# Patient Record
Sex: Female | Born: 1965 | Race: White | Hispanic: No | Marital: Married | Smoking: Current some day smoker
Health system: Southern US, Community
[De-identification: ages and names within clinical notes are randomized; demographics above are authoritative.]

## PROBLEM LIST (undated history)

## (undated) DIAGNOSIS — E119 Type 2 diabetes mellitus without complications: Secondary | ICD-10-CM

## (undated) DIAGNOSIS — E079 Disorder of thyroid, unspecified: Secondary | ICD-10-CM

## (undated) DIAGNOSIS — H539 Unspecified visual disturbance: Secondary | ICD-10-CM

## (undated) DIAGNOSIS — B059 Measles without complication: Secondary | ICD-10-CM

## (undated) DIAGNOSIS — E039 Hypothyroidism, unspecified: Secondary | ICD-10-CM

## (undated) DIAGNOSIS — B269 Mumps without complication: Secondary | ICD-10-CM

## (undated) DIAGNOSIS — E785 Hyperlipidemia, unspecified: Secondary | ICD-10-CM

## (undated) DIAGNOSIS — R262 Difficulty in walking, not elsewhere classified: Secondary | ICD-10-CM

## (undated) DIAGNOSIS — K3184 Gastroparesis: Secondary | ICD-10-CM

## (undated) DIAGNOSIS — M799 Soft tissue disorder, unspecified: Secondary | ICD-10-CM

## (undated) DIAGNOSIS — G8929 Other chronic pain: Secondary | ICD-10-CM

## (undated) DIAGNOSIS — D649 Anemia, unspecified: Secondary | ICD-10-CM

## (undated) DIAGNOSIS — I739 Peripheral vascular disease, unspecified: Secondary | ICD-10-CM

## (undated) DIAGNOSIS — N289 Disorder of kidney and ureter, unspecified: Secondary | ICD-10-CM

## (undated) DIAGNOSIS — G909 Disorder of the autonomic nervous system, unspecified: Secondary | ICD-10-CM

## (undated) DIAGNOSIS — M545 Low back pain, unspecified: Secondary | ICD-10-CM

## (undated) DIAGNOSIS — M549 Dorsalgia, unspecified: Secondary | ICD-10-CM

## (undated) DIAGNOSIS — B019 Varicella without complication: Secondary | ICD-10-CM

## (undated) DIAGNOSIS — K219 Gastro-esophageal reflux disease without esophagitis: Secondary | ICD-10-CM

## (undated) HISTORY — PX: CAROTID ENDARTERECTOMY: SUR193

## (undated) HISTORY — PX: FOOT SURGERY: SHX648

## (undated) HISTORY — PX: FOOT AMPUTATION: SHX951

## (undated) HISTORY — PX: BACK SURGERY: SHX140

## (undated) HISTORY — DX: Dorsalgia, unspecified: M54.9

## (undated) HISTORY — DX: Measles without complication: B05.9

## (undated) HISTORY — PX: SPINE SURGERY: SHX786

## (undated) HISTORY — DX: Varicella without complication: B01.9

## (undated) HISTORY — PX: COLONOSCOPY, DIAGNOSTIC (SCREENING): SHX174

## (undated) HISTORY — DX: Other chronic pain: G89.29

## (undated) HISTORY — DX: Mumps without complication: B26.9

---

## 1994-10-16 ENCOUNTER — Inpatient Hospital Stay
Admission: EM | Admit: 1994-10-16 | Disposition: A | Discharge status: Home / Self Care | Payer: Self-pay | Source: Ambulatory Visit | Admitting: Family Medicine

## 1998-09-19 ENCOUNTER — Ambulatory Visit
Admit: 1998-09-19 | Disposition: A | Discharge status: Home / Self Care | Payer: Self-pay | Source: Ambulatory Visit | Admitting: "Endocrinology

## 1999-08-17 ENCOUNTER — Ambulatory Visit
Admit: 1999-08-17 | Disposition: A | Discharge status: Home / Self Care | Payer: Self-pay | Source: Ambulatory Visit | Admitting: "Endocrinology

## 2015-12-31 DIAGNOSIS — Z22322 Carrier or suspected carrier of Methicillin resistant Staphylococcus aureus: Secondary | ICD-10-CM

## 2015-12-31 HISTORY — DX: Carrier or suspected carrier of methicillin resistant Staphylococcus aureus: Z22.322

## 2016-06-29 HISTORY — PX: OTHER SURGICAL HISTORY: SHX169

## 2016-12-30 DIAGNOSIS — M869 Osteomyelitis, unspecified: Secondary | ICD-10-CM

## 2016-12-30 DIAGNOSIS — Z9289 Personal history of other medical treatment: Secondary | ICD-10-CM

## 2016-12-30 HISTORY — DX: Personal history of other medical treatment: Z92.89

## 2016-12-30 HISTORY — DX: Osteomyelitis, unspecified: M86.9

## 2017-11-21 ENCOUNTER — Encounter (INDEPENDENT_AMBULATORY_CARE_PROVIDER_SITE_OTHER): Payer: Self-pay | Admitting: Neurological Surgery

## 2017-11-25 ENCOUNTER — Ambulatory Visit (INDEPENDENT_AMBULATORY_CARE_PROVIDER_SITE_OTHER): Payer: BC Managed Care – HMO | Admitting: Neurological Surgery

## 2017-11-29 DIAGNOSIS — Z9289 Personal history of other medical treatment: Secondary | ICD-10-CM

## 2017-11-29 HISTORY — DX: Personal history of other medical treatment: Z92.89

## 2017-12-03 ENCOUNTER — Other Ambulatory Visit (INDEPENDENT_AMBULATORY_CARE_PROVIDER_SITE_OTHER): Payer: Self-pay | Admitting: Physician Assistant

## 2017-12-03 LAB — BASIC METABOLIC PANEL
African American eGFR: 67.07
BUN / Creatinine Ratio: 14.2
BUN: 15 mg/dL (ref 6–23)
CO2: 24 mmol/L (ref 19–31)
Calcium: 9.8 mg/dL (ref 8.6–10.2)
Chloride: 102 mEq/l (ref 97–107)
Creatinine: 1.05 mg/dL — ABNORMAL HIGH (ref 0.50–1.00)
Glucose: 186 mg/dL — ABNORMAL HIGH (ref 74–106)
Potassium: 4.7 mmol/L (ref 3.5–5.1)
Sodium: 136 mmol/L (ref 135–145)
non-African American eGFR: 55.43

## 2017-12-03 LAB — HEMOGLOBIN A1C: Hemoglobin A1C: 8.3 g/dL — ABNORMAL HIGH (ref 3.8–5.7)

## 2017-12-04 ENCOUNTER — Ambulatory Visit (INDEPENDENT_AMBULATORY_CARE_PROVIDER_SITE_OTHER): Payer: BC Managed Care – HMO

## 2017-12-04 ENCOUNTER — Other Ambulatory Visit (INDEPENDENT_AMBULATORY_CARE_PROVIDER_SITE_OTHER): Payer: BC Managed Care – HMO | Admitting: Physician Assistant

## 2017-12-04 DIAGNOSIS — Z01811 Encounter for preprocedural respiratory examination: Secondary | ICD-10-CM

## 2017-12-14 DIAGNOSIS — M5440 Lumbago with sciatica, unspecified side: Secondary | ICD-10-CM | POA: Insufficient documentation

## 2017-12-14 DIAGNOSIS — G8929 Other chronic pain: Secondary | ICD-10-CM | POA: Insufficient documentation

## 2017-12-14 DIAGNOSIS — E890 Postprocedural hypothyroidism: Secondary | ICD-10-CM | POA: Insufficient documentation

## 2017-12-14 DIAGNOSIS — E109 Type 1 diabetes mellitus without complications: Secondary | ICD-10-CM | POA: Insufficient documentation

## 2017-12-14 DIAGNOSIS — Z89421 Acquired absence of other right toe(s): Secondary | ICD-10-CM | POA: Insufficient documentation

## 2017-12-15 ENCOUNTER — Other Ambulatory Visit: Payer: Self-pay | Admitting: Cardiovascular Disease

## 2017-12-15 ENCOUNTER — Ambulatory Visit (INDEPENDENT_AMBULATORY_CARE_PROVIDER_SITE_OTHER): Payer: Self-pay | Admitting: Cardiovascular Disease

## 2017-12-15 DIAGNOSIS — Z01810 Encounter for preprocedural cardiovascular examination: Secondary | ICD-10-CM

## 2017-12-15 DIAGNOSIS — E109 Type 1 diabetes mellitus without complications: Secondary | ICD-10-CM

## 2017-12-16 ENCOUNTER — Ambulatory Visit
Admission: RE | Admit: 2017-12-16 | Discharge: 2017-12-16 | Disposition: A | Discharge status: Home / Self Care | Payer: BC Managed Care – HMO | Source: Ambulatory Visit | Attending: Cardiovascular Disease | Admitting: Cardiovascular Disease

## 2017-12-16 ENCOUNTER — Other Ambulatory Visit (INDEPENDENT_AMBULATORY_CARE_PROVIDER_SITE_OTHER): Payer: Self-pay

## 2017-12-16 ENCOUNTER — Other Ambulatory Visit: Payer: Self-pay | Admitting: Cardiovascular Disease

## 2017-12-16 DIAGNOSIS — Z01810 Encounter for preprocedural cardiovascular examination: Secondary | ICD-10-CM

## 2017-12-16 DIAGNOSIS — I348 Other nonrheumatic mitral valve disorders: Secondary | ICD-10-CM | POA: Insufficient documentation

## 2017-12-16 DIAGNOSIS — Z01818 Encounter for other preprocedural examination: Secondary | ICD-10-CM | POA: Insufficient documentation

## 2017-12-17 ENCOUNTER — Inpatient Hospital Stay
Admission: RE | Admit: 2017-12-17 | Discharge: 2017-12-17 | Disposition: A | Discharge status: Home / Self Care | Payer: BC Managed Care – HMO | Source: Ambulatory Visit | Attending: Cardiovascular Disease | Admitting: Cardiovascular Disease

## 2017-12-17 DIAGNOSIS — E782 Mixed hyperlipidemia: Secondary | ICD-10-CM | POA: Insufficient documentation

## 2017-12-17 DIAGNOSIS — E109 Type 1 diabetes mellitus without complications: Secondary | ICD-10-CM | POA: Insufficient documentation

## 2017-12-17 DIAGNOSIS — Z01810 Encounter for preprocedural cardiovascular examination: Secondary | ICD-10-CM

## 2017-12-17 DIAGNOSIS — Z8249 Family history of ischemic heart disease and other diseases of the circulatory system: Secondary | ICD-10-CM | POA: Insufficient documentation

## 2017-12-17 DIAGNOSIS — Z01818 Encounter for other preprocedural examination: Secondary | ICD-10-CM | POA: Insufficient documentation

## 2017-12-17 MED ORDER — TECHNETIUM TC 99M TETROFOSMIN INJECTION
1.00 | Freq: Once | Status: AC | PRN
Start: 2017-12-17 — End: 2017-12-17
  Administered 2017-12-17: 12:00:00 1 via INTRAVENOUS
  Filled 2017-12-17: qty 1

## 2017-12-17 MED ORDER — REGADENOSON 0.4 MG/5ML IV SOLN
0.40 mg | Freq: Once | INTRAVENOUS | Status: AC | PRN
Start: 2017-12-17 — End: 2017-12-17
  Administered 2017-12-17: 12:00:00 0.4 mg via INTRAVENOUS
  Filled 2017-12-17: qty 5

## 2017-12-17 MED ORDER — TECHNETIUM TC 99M TETROFOSMIN INJECTION
1.00 | Freq: Once | Status: AC | PRN
Start: 2017-12-17 — End: 2017-12-17
  Administered 2017-12-17: 11:00:00 1 via INTRAVENOUS
  Filled 2017-12-17: qty 1

## 2017-12-19 DIAGNOSIS — I739 Peripheral vascular disease, unspecified: Secondary | ICD-10-CM | POA: Insufficient documentation

## 2017-12-19 DIAGNOSIS — E782 Mixed hyperlipidemia: Secondary | ICD-10-CM | POA: Insufficient documentation

## 2017-12-22 DIAGNOSIS — M47816 Spondylosis without myelopathy or radiculopathy, lumbar region: Secondary | ICD-10-CM | POA: Insufficient documentation

## 2017-12-30 HISTORY — PX: CAROTID ENDARTERECTOMY: SUR193

## 2018-03-17 ENCOUNTER — Encounter: Payer: Self-pay | Admitting: Acute Care

## 2018-03-17 ENCOUNTER — Ambulatory Visit (INDEPENDENT_AMBULATORY_CARE_PROVIDER_SITE_OTHER): Payer: 59 | Admitting: Acute Care

## 2018-03-17 VITALS — BP 120/73 | HR 73 | Temp 97.6°F | Ht 68.0 in | Wt 163.0 lb

## 2018-03-17 DIAGNOSIS — E1065 Type 1 diabetes mellitus with hyperglycemia: Secondary | ICD-10-CM

## 2018-03-17 MED ORDER — INSULIN ASPART 100 UNIT/ML SC SOLN
SUBCUTANEOUS | 3 refills | Status: DC
Start: 2018-03-17 — End: 2021-02-09

## 2018-03-17 NOTE — Progress Notes (Signed)
[facility] [place] [facility]   Subjective:      [name] [name] is a 52 y.o. female who presents for initial visit of Type 1 diabetes mellitus.  Pt was self referred.  PMH significant for postablative hypothyroidism, back pain with sciatica, osteomyelitis with R+ foot 5th toe/metatarsal amputation, HLD.   I reviewed the notes and labs in EPIC    Diabetes:   Pt was diagnosed with diabetes type 1, at age 31 years old. Has had the classic symptoms of blurred vision, wt loss, polyuria and polydipsia. Was in basal and bolus insulin for years and start using insulin pump 2.5 years ago.       Hg A1c 7.7% done in 12/20/2017. Last Hb A1c is  7.9% done today 03/17/2018.     Current symptoms/problems include hyperglycemia, visual disturbances and bad tasted in her mouth and have been unchanged. Symptoms have been present for several months.  Has medical alert:     Medications:   Current diabetic medications include: Novolog insulin    Insulin pump model: medtronic 670G. Has been on pump since age 89  18.775 units/ 24 hr  Insulin: novolog  Basal setting:                  Carb ratio:               MN to 7:30 AM : 1 :12gm                  7 :30 AM to 11:30 AM, 1:10 gm     11;30 AM to 6:30 PM 1:8 gm   6:30 PM to MN 1:10 gm     Sensitivity: 1:45   Goal:               MN to 6 AM:90 - 110              6AM to MN: 106 - 120                 Changes sensor every 7 days     Insulin rotation areas: jon insulin pump  Glucagon Kit: yes      Diet:  Type of diet: not eat in regular bases, first meal is around 2 - 3 PM,  Good, healthy meals and then are at different times of the day not consistent  How many meals per day: 2  Weight trend: stable  Has you seen a DM educator: in the past, needs insulin pump teaching     Exercise:   Current exercise: walk dailly     Glucose Log:  Current monitoring regimen: blood glucose test x/day  Home blood sugar records: CGM  FBG:300, 100  4 AM 350  12PM 300  Pre lunch: 200's  Pre dinner:  300,180  Post Meal  HS  Any episodes of hypoglycemia? Yes 46, 50's  - in 4 - 5 AM, noticed hypoglycemia is she is in the 60's  Testing urine for Ketones: no     Diabetes complications:   Cerebrovascular disease: no  Retinopathy:   Last eye exam by ophthalmology: Jan 2018  .  Cardiovascular disease: no  BP: at goal  GI: gastroparesis? Reported having some slow digestion  Nephropathy:    On ACE-I/ARB: 65, done 01/21/18  Statin: Lipitor  Peripheral Neuropathy: no  Do you have burning sensation on your feet: no  Do you have tingling sensation on your feet:no  Last foot exam was  Done here. F/u with podiatrist: yes  Peripheral Vascular disease: no, R+ foot osteoporosis, s/p amputation of 5 th toe.      Cardiovascular Risk Factors;  Cardiovascular risk factors: diabetes mellitus, dyslipidemia and smoking/ tobacco exposure  ETOH consumption: 1 -2 glass/month   Do you smoke: regular use of marihuana/medical used? Uses for her eye pain   When do you quit: smoking cigarettes Dec 2018   Do you use smokeless tobacco: no  Illicit drugs: no    Social:   Pt presents today by herself.  Pt is married and live with husband .   Pt not employed.  Support system:    The following portions of the patient's history were reviewed and updated as appropriate: allergies, current medications, past family history, past medical history, past social history, past surgical history and problem list.  Chief Complaint   Patient presents with   . Diabetes     BS146 ;    HBA1C 7.9%     BP 120/73 (BP Site: Right arm, Patient Position: Sitting, Cuff Size: Medium)   Pulse 73   Temp 97.6 F (36.4 C) (Oral)   Ht 1.727 m (5\' 8" )   Wt 73.9 kg (163 lb)   SpO2 98%   BMI 24.78 kg/m   No past medical history on file.  No past surgical history on file.  No family history on file.  No Known Allergies  Social History     Social History   . Marital status: Married     Spouse name: N/A   . Number of children: N/A   . Years of education: N/A     Occupational  History   . Not on file.     Social History Main Topics   . Smoking status: Current Some Day Smoker   . Smokeless tobacco: Not on file   . Alcohol use Not on file   . Drug use: Unknown   . Sexual activity: Not on file     Other Topics Concern   . Not on file     Social History Narrative   . No narrative on file     Current Outpatient Prescriptions on File Prior to Visit   Medication Sig Dispense Refill   . acyclovir (ZOVIRAX) 400 MG tablet Take 400 mg by mouth every 4 (four) hours while awake.     Marland Kitchen atorvastatin (LIPITOR) 10 MG tablet Take 10 mg by mouth daily.     Marland Kitchen b complex vitamins tablet Take 1 tablet by mouth daily.     . insulin lispro protamine-lispro 50-50 MIXTURE (HUMALOG MIX 50-50) (50-50) 100 UNIT/ML Suspension injection Inject into the skin 2 (two) times daily before meals.     Marland Kitchen levothyroxine (SYNTHROID, LEVOTHROID) 137 MCG tablet Take 137 mcg by mouth every morning before breakfast.     . lisinopril (PRINIVIL,ZESTRIL) 10 MG tablet Take 10 mg by mouth daily.     . Omega-3 Fatty Acids (FISH OIL) 1000 MG Cap capsule Take 1 capsule by mouth 2 (two) times daily.     . vitamin E 1000 UNIT capsule Take 1,000 Units by mouth daily.       No current facility-administered medications on file prior to visit.      Vitals:    03/17/18 1343   BP: 120/73   Pulse: 73   Temp: 97.6 F (36.4 C)   SpO2: 98%     No results found for: CHOL, TRIG, HDL, LDL, ALT, AST, NA, K, CL, CREAT,  BUN, CO2, TSH      Review of Systems  Eyes: positive for visual disturbance  Ears, nose, mouth, throat, and face: + for hoarseness, negative nasal congestion, sore mouth and sore throat.  Denies dysphagia.  Respiratory: negative for asthma, + dry cough,  Cardiovascular: negative for chest pain, dyspnea, lower extremity edema, palpitations, syncope and tachypnea  Gastrointestinal: c/o gastroparesis, nocturnal diarrhea (incontinence)   Integument/breast: negative for pruritus, rash, Hematologic/lymphatic: negative for easy bruising,  lymphadenopathy and petechiae  Musculoskeletal:+ back pain, bone pain, muscle weakness, myalgias, bilateral wrist pain  Neurological: negative for coordination problems, dizziness, gait problems, headaches   Behavioral/Psych: anxious, verbose, current marihuana use  Endocrine: see HPI     Objective:      BP 120/73 (BP Site: Right arm, Patient Position: Sitting, Cuff Size: Medium)   Pulse 73   Temp 97.6 F (36.4 C) (Oral)   Ht 1.727 m (5\' 8" )   Wt 73.9 kg (163 lb)   SpO2 98%   BMI 24.78 kg/m   Constitutional: WD, WN, and NAD.   Head: Normocephalic and atraumatic. Mucous with moist membranes   Eyes: Conjunctivae and EOM are normal. PERR  Neck: no adenopathy, no JVD, supple, symmetrical, trachea midline and thyroid not enlarged. No tenderness/mass/nodules  Cardiac: regular rate and rhythm, S1, S2   Pulmonary: clear to auscultation, bilaterally  Abdomen: soft, non-tender; bowel sounds normal  Extremities: No cyanosis, clubbing or edema.  Musculoskeletal: Normal range of motion. Steady gait.  Skin: warm, dry and intact, no hyperpigmentation  Feet: skin intact, decrease toe mobility, decrease light sensation, +pulses, color/temperature normal,   wo hypertrophic nails, dry and hard ulcer at the end of her R+ foot surgical incision.  Pulse +2 symmetric  Neurological: Alert and oriented x 3, speech normal,   Psychiatric: Affect and judgment appropriate.      Hospital Encounter on 01/21/2018  [facility]")   Hospital Encounter on 12/19/2017  [facility]")' href="[email or web address]/">12/20/2017   Hospital Encounter on 12/19/2017  [facility]")' href="[email or web address]/">12/20/2017      Specimen: Blood")'>141   Specimen: Blood")'>134 (L)   Specimen: Blood")'>136 (L)     Specimen: Blood")'>5.2 (H) K   Specimen: Blood")'>5.2 (H)   Specimen: Blood")'>5.3 (H)     Specimen: Blood")'>107 (H)   Specimen:  Blood")'>100   Specimen: Blood")'>100     Specimen: Blood")'>21 (L)   Specimen: Blood")'>26   Specimen: Blood")'>25     Specimen: Blood")'>21 (H)   Specimen: Blood")'>19 (H)   Specimen: Blood")'>21 (H)     Specimen: Blood")'>1.0   Specimen: Blood")'>0.9   Specimen: Blood")'>0.9     Specimen: Blood")'>88   Specimen: Blood")'>228 (H)   Specimen: Blood")'>278 (H)     Specimen: Blood")'>10.0   Specimen: Blood")'>9.4   Specimen: Blood")'>9.5     Specimen: Blood")'>28    Specimen: Blood")'>25     Specimen: Blood")'>23    Specimen: Blood")'>24     Specimen: Blood")'>87    Specimen: Blood")'>54     Specimen: Blood")'>7.3    Specimen: Blood")'>5.9 (L)     Specimen: Blood")'>4.4    Specimen: Blood")'>3.6     Specimen: Blood")'>65   Specimen: Blood")'>74   Specimen: Blood")'>74         Lab Review    POC Hg A1c 7.9%  POC random BG 146     Assessment:   Mr Kretsch is a 52 y/o female, who looks older than her stage age, presented today for initial evaluation, establishing care of T1 DM. Pt was diagnosed  with T1 diabetes 29 years ago. Pt reported was uncontrolled to following consistent CHO diet or consistent with her medications. She has had osteomyelitis on his R+ foot and required 5th toe/metatarsal amputation.     Diabetes Mellitus type I, under fair control, Hg A1c 7.9% above target, on insulin pump, needs more pump teaching and insulin adjustment.     Postablative hypothyroidisms taking Levothyroxine 137 mcg daily    HLD  R+ foot osteomyelitis s/p 5th toe/metatarsal amputation 12/30/2016, has dry ulcer at the end of the surgical incision- needs follow up with podiatrist       Plan:      Visit encounter was 45 minutes, of which 25 minutes were spent on patient's education and discussing the disease process and it's potential implications and impact of patient's health.     Discussed with patient the importance of achieving and maintaining targeted glycemic control in order to reduce the risk of diabetes related  complications such as eye disease, kidney disease, nerve damage, and amputation of the limbs.    Discussed the importance of patient's active involvement in the diabetes care (attending diabetes education classes or individual education sessions, monitoring blood glucose levels before and after meals and keeping a log of self monitored blood glucose, SMBG, taking prescribed medications on time, eating a healthy diabetic diet, and engaging in regular physical activity as tolerated).     Discussed the importance of addressing and managing traditional modifiable cardiac risk factors (lipids, blood pressure, smoking, obesity, sedentary lifestyle) in order to reduce the risk of cardiovascular disease, CVD (heart attacks, heart failure, strokes, and peripheral arterial disease).     Discussed the importance of recognizing, treating, and preventing hypoglycemia.    1.  Rx changes: continue current insulin pump setting. Needs to do food record. Eat in regular bases. Make appointment with the dietitian for MNT and insulin adjustment. Report BG log by Mychart.    2.  Education: Reviewed 'ABCs' of diabetes management (respective goals in parentheses):  A1C (<7), blood pressure (<130/80), and cholesterol (LDL <100).  3.  Compliance at present is estimated to be fair. Efforts to improve consistent carbohydrate diet, regular exercise and SMBG.  4.  Referred for diabetes education, pt has no previous diabetic education, major deficit in diabetes, complications, management, prevention, diet compliance.Marland Kitchen    5.Follow up with ophthalmologist  6. Make appointment with podiatrist    7. Follow up: 6 weeks        Judithann Sheen, NP  [facility]  [address]  [place], [place] [address]  Tel: [phone]  Fax: [phone]

## 2018-03-17 NOTE — Patient Instructions (Signed)
Make appointment with dietitian for insulin adjustment    Keep food log and bring it to your next appointment    Please use Mychart for ease communication     Follow up in 6 weeks

## 2018-03-18 LAB — POCT HEMOGLOBIN A1C: POCT Hgb A1C: 7.9 % — AB (ref 3.9–5.9)

## 2018-03-18 LAB — POCT GLUCOSE: Whole Blood Glucose POCT: 146 mg/dL — AB (ref 70–100)

## 2018-03-18 NOTE — Progress Notes (Signed)
Faxed to the number [phone] ([facility] )the last  progress note requested per the pt.

## 2018-03-23 NOTE — Progress Notes (Signed)
Faxed per second time last progress note and lab result of the pt to the number [phone] to doctor Shanda Howells.

## 2018-03-26 DIAGNOSIS — E559 Vitamin D deficiency, unspecified: Secondary | ICD-10-CM | POA: Insufficient documentation

## 2018-03-26 DIAGNOSIS — Z8639 Personal history of other endocrine, nutritional and metabolic disease: Secondary | ICD-10-CM | POA: Insufficient documentation

## 2018-04-07 ENCOUNTER — Ambulatory Visit (INDEPENDENT_AMBULATORY_CARE_PROVIDER_SITE_OTHER): Payer: Self-pay | Admitting: Cardiovascular Disease

## 2018-04-29 ENCOUNTER — Ambulatory Visit (INDEPENDENT_AMBULATORY_CARE_PROVIDER_SITE_OTHER): Payer: Commercial Managed Care - POS

## 2018-04-29 VITALS — Ht 68.0 in | Wt 167.2 lb

## 2018-04-29 DIAGNOSIS — E1065 Type 1 diabetes mellitus with hyperglycemia: Secondary | ICD-10-CM

## 2018-04-29 NOTE — Progress Notes (Signed)
[facility]  MEDICAL NUTRITION THERAPY ASSESSMENT:    Referral: [name] [name] is 51 y.o.female was referred by Clovis Cao, NP, for Medical Nutrition Therapy for uncontrolled type 1 diabetes.     Client history: Patient's medical history includes HTN, HLD, anemia, and low vitamin D. Complications of diabetes include neuropathy, retinopathy, and gastroparesis; she has had toe amputation in the past. He/she was first diagnosed with diabetes in 1989. S/he has had previous education for diabetes self management; unknown how long ago. She is using a Medtronic 670G insulin pump with Guardian 3 sensor. No reports available today from [facility]. She needs follow up for pump settings adjustment. She reports additional digestive issues including diarrhea, which she thinks is related to stress. She is not sure whether or not she has diverticulosis; may have had an attack of diverticulitis in the past. She is not testing blood glucose adequately; only 1-2 times daily.    Current pump settings:  Basal:   12A 0.750   6:30A 0.8   10P 0.750  Carb ratio:   12A 12   7:30A 10   11:30A 8   6:30P 10  Sensitivity:  45  Targets:   12A 90-110   6P  106-120  IAD: 3.5 hours  Max basal: 2.0 u/hr  Max bolus: 10  Low insulin alert: 20 units    Pertinent Labs:    A1c 7.9% March 2019    SMBG Readings Past Two Weeks: no records provided today.    Anthropometrics:  Estimated body mass index is 25.42 kg/m as calculated from the following:    Height as of this encounter: 1.727 m (5\' 8" ).    Weight as of this encounter: 75.8 kg (167 lb 3.2 oz).    Food allergies: cranberries    Dietary recall/Food log info: No records provided. Will assess at next visit. Her usual awake period is from 10A each morning to 2A the next morning. She reports limited food tolerance with gastroparesis. She tends to "graze" a lot instead of eat meals. She states that she gets 2.5 - 4 bottles water daily (16 oz each).    She  reports that she can eat the following foods without severe symptoms:   Muffins    Sandwich, 1/2   Crackers or bread with cheese   A little fruit at a time   Cheerios    Some cooked vegetables    Nutrition Diagnosis:   Behavioral Environmental Limited adherence to nutrition-related recommendations related to digestive issues as evidenced by patient report.    Intervention:                           Education/training:   Discussed managing blood sugar with insulin when gastroparesis is severe:   Frequent small meals/snacks   May need to give insulin after starting to eat   Provided My Food Plan and reviewed carb counting (what counts, how to count with labels, lists, apps and internet resources), and recommendations for daily carbs and at meals/snacks.   Reminded her to give insulin for all snacks that are carbohydrates; DON'T correct blood sugar without a buffer of at least 2 hours between correction boluses.   Asked her to test blood glucose 3 times daily, to start:   On waking   Before her first meal (more than 1 hour after waking)   2 hours after the start of her "dinner" or largest meals of  the day   Advised her to try to get up to 8 cups fluid every day.   Provided blank records to complete before the next visit    Education Plan:    Advanced features   Need her to register on [facility] and be able to upload or share data   Assess needs    Goals:    A1c <7% without excessive hypoglycemia    Barriers:    Burnout     Understanding and Motivation to follow recommendations: fair    Monitoring/Evaluation:     Follow up: 06/08/18    Progress: she verbalized understanding education and instructions provided today.

## 2018-05-08 ENCOUNTER — Ambulatory Visit (INDEPENDENT_AMBULATORY_CARE_PROVIDER_SITE_OTHER): Payer: Commercial Managed Care - POS | Admitting: Specialist

## 2018-05-08 ENCOUNTER — Encounter (INDEPENDENT_AMBULATORY_CARE_PROVIDER_SITE_OTHER): Payer: Self-pay | Admitting: Specialist

## 2018-05-08 VITALS — BP 125/64 | HR 80 | Ht 68.0 in | Wt 165.0 lb

## 2018-05-08 DIAGNOSIS — E782 Mixed hyperlipidemia: Secondary | ICD-10-CM | POA: Insufficient documentation

## 2018-05-08 DIAGNOSIS — R0989 Other specified symptoms and signs involving the circulatory and respiratory systems: Secondary | ICD-10-CM

## 2018-05-08 DIAGNOSIS — I739 Peripheral vascular disease, unspecified: Secondary | ICD-10-CM

## 2018-05-08 NOTE — Progress Notes (Signed)
[facility]    Chief Complaint   Patient presents with   . Consult (Initial)     consult to possible bilateral LE PAD     History of Present Illness     [name] [name] is a 52 y.o. female who presents as new a patient for evaluation of the lower extremities. The patient has a history of type one diabetes for over 30 years and is managed with an insulin pump. She has a history of a non-healing wound on the right foot, resulting in a partial foot amputation and several reconstructive surgical interventions a few years ago. At that time, she was managed by a podiatrist who felt that she did not have any major arterial disease that needed to be addressed. Since then, she has undergone spinal surgery for lower extremity pain. That pain did not improve with surgery, so she is here for an arterial evaluation.     She described claudication after walking 50 yards in both lower extremities. She denies rest pain or ischemic tissue loss. She does have mild neuropathy of the bilateral feet. She complains of a generalized fatigue of all muscle including the upper extremities, but denies classic claudication symptoms. Finally, she describes feeling light headed and dizzy. She denies symptoms of syncope, but has found herself lying on the ground unexpectedly.     She is currently taking Atorvastatin and Lisinopril. She is a former smoker.     Past Medical History     Past Medical History:   Diagnosis Date   . Chronic back pain    . Osteomyelitis of foot     R+ foot s/p 5th toe and lateral bone surgery       Past Surgical History:   Procedure Laterality Date   . R+ foot 5th toe/metatarsal amputation  12/30/2016       Allergies     No Known Allergies    Medications       Current Outpatient Prescriptions:   .  acyclovir (ZOVIRAX) 400 MG tablet, Take 400 mg by mouth every 4 (four) hours while awake., Disp: , Rfl:   .  atorvastatin (LIPITOR) 10 MG tablet, Take 10 mg by mouth daily., Disp: , Rfl:   .  atorvastatin (LIPITOR)  20 MG tablet, atorvastatin 20 mg tablet  TK 1 T PO D, Disp: , Rfl:   .  b complex vitamins tablet, Take 1 tablet by mouth daily., Disp: , Rfl:   .  insulin aspart (NOVOLOG) 100 UNIT/ML injection, On insulin pump 18 - 25 units/day, Disp: 30 mL, Rfl: 3  .  insulin lispro protamine-lispro 50-50 MIXTURE (HUMALOG MIX 50-50) (50-50) 100 UNIT/ML Suspension injection, Inject into the skin 2 (two) times daily before meals., Disp: , Rfl:   .  levothyroxine (SYNTHROID, LEVOTHROID) 137 MCG tablet, Take 137 mcg by mouth every morning before breakfast., Disp: , Rfl:   .  lisinopril (PRINIVIL,ZESTRIL) 10 MG tablet, Take 10 mg by mouth daily., Disp: , Rfl:   .  Omega-3 Fatty Acids (FISH OIL) 1000 MG Cap capsule, Take 1 capsule by mouth 2 (two) times daily., Disp: , Rfl:   .  vitamin E 1000 UNIT capsule, Take 1,000 Units by mouth daily., Disp: , Rfl:     Review of Systems     Constitutional: Negative for fevers and chills  Skin: No rash or lesions  Respiratory: Negative for cough, wheezing, or hemoptysis  Cardiovascular: Negative for chest pain and dyspnea  Gastrointestinal: Negative for abdominal pain,  nausea, vomiting and diarrhea  Musculoskeletal: See HPI, spinal issues, arthritis.  Genitourinary: Negative for dysuria  All other systems were reviewed and are negative except what is stated in the HPI    Physical Exam     Vitals:    05/08/18 1541   BP: 125/64   Pulse: 80       Body mass index is 25.09 kg/m.    General: Patient appears their stated age, well-nourished. Alert and in no apparent distress.  Lungs: Respiratory effort unlabored, chest expansion symmetric.  Cardiac: RRR, right carotid bruits, no JVD.   Abd: Soft, nondistended, nontender. No guarding or rebound, No mid line pulsatile mass   Extremities:Full ROM, symmetric, warm  Ischemic changes not present, Ulceration not present,  Gangrene not present, Peripheral edema: No  Pulses: Right: Femoral present Popliteal present, DP present, PT only on doppler  Pulses:  Left: Femoral present, Popliteal present, DP present, PT only on doppler  Veins: Varicose veins No, hyperpigmentation No, lipo-dermatosclerosis No  Skin: clean dry and intact  Neuro: Good insight and judgment, oriented to person, place, and time CN II-XII intact, gross motor and sensory intact    Labs     CBC: No results found for: WBC, RBC, HGB, HCT, MCV, MCHC, RDW, PLT    CMP: No results found for: NA, K, CL, CO2, GLU, BUN, CREATININE, PROT, BILITOT, ALKPHOS, AST, ALT, ANIONGAP    Lipid Panel No results found for: CHOL, TRIG, HDL    Coags: No results found for: PT, INR, PTT    Imaging     No new imaging was performed today.     Assessment and Plan       1. Peripheral vascular disease  US Arterial/ Graft Duplex Dopp Low Extrem Bil Comp    [place] Noninvas Low Extrem Art Dopp/Press/Wavefrms (Abi-Ppg) Ltd 1-2 Lvls   2. Bruit of right carotid artery  US Carotid Duplex Dopp Comp Bilateral     Based on the information above, this patient has complaints of claudication of the lower extremities. Her pulse examination is fairly normal; however, her history indicates potentially some arterial disease. Given her history of right foot partial amputation, type I diabetes and being a former smoker, does put her at risk. We have requested she return to the office in the near future for a full arterial evaluation, we will perform an arterial duplex and ankle brachial index. Unfortunately, if the studies are normal and similar to the physical examination, her symptoms are most likely still coming from the back.     Finally, at that appointment we will perform a carotid duplex to evaluate the bilateral carotid arteries. She does have a right carotid bruit.     I hope this plan of care meets with your approval. Thank you for the opportunity to care for your patient.     Sincerely,     Dipankar Marcy Panning, M.D.     [facility]  [address]  [place], [place] [address]  T [phone]  F [phone]

## 2018-05-13 ENCOUNTER — Ambulatory Visit (INDEPENDENT_AMBULATORY_CARE_PROVIDER_SITE_OTHER): Payer: Commercial Managed Care - POS

## 2018-05-13 ENCOUNTER — Ambulatory Visit (INDEPENDENT_AMBULATORY_CARE_PROVIDER_SITE_OTHER): Payer: Commercial Managed Care - POS | Admitting: Specialist

## 2018-05-13 ENCOUNTER — Encounter (INDEPENDENT_AMBULATORY_CARE_PROVIDER_SITE_OTHER): Payer: Self-pay | Admitting: Specialist

## 2018-05-13 VITALS — BP 126/76 | HR 76 | Ht 68.0 in | Wt 165.0 lb

## 2018-05-13 DIAGNOSIS — I6529 Occlusion and stenosis of unspecified carotid artery: Secondary | ICD-10-CM | POA: Insufficient documentation

## 2018-05-13 DIAGNOSIS — R0989 Other specified symptoms and signs involving the circulatory and respiratory systems: Secondary | ICD-10-CM

## 2018-05-13 DIAGNOSIS — I6521 Occlusion and stenosis of right carotid artery: Secondary | ICD-10-CM

## 2018-05-13 DIAGNOSIS — I739 Peripheral vascular disease, unspecified: Secondary | ICD-10-CM

## 2018-05-13 NOTE — Progress Notes (Signed)
[facility]    Chief Complaint   Patient presents with   . Follow-up     1 week f/u to PVD and right  carotid bruit      History of Present Illness     This is a 52 year old female who returns the office for 1 week follow-up appointment to review bilateral lower extremity arterial duplex, ankle-brachial index and carotid duplex.  The patient had originally presented with complaints of bilateral lower extremity pain.  She does have a history of type 1 diabetes for over 30 years and is managed with an insulin pump.  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  That has remained healed for several years.  The patient also has a history of significant spinal stenosis and spinal surgery.  She feels that the pain was not improved with surgical intervention.  Patient is wondering if there is an arterial component to her discomfort.  She denies classic symptoms of claudication, she denies ischemic rest pain and tissue loss.    She does have a history of lightheadedness and dizziness.  She denies symptoms of syncopal episodes. Upon physical exam, at her last appointment, she had a bruit in her right internal carotid artery.  She denies neurological deficit.  She denies a history of stroke or transient ischemic attack.    Past Medical History     Past Medical History:   Diagnosis Date   . Chronic back pain    . Osteomyelitis of foot     R+ foot s/p 5th toe and lateral bone surgery       Past Surgical History:   Procedure Laterality Date   . R+ foot 5th toe/metatarsal amputation  12/30/2016       Allergies     No Known Allergies    Medications       Current Outpatient Prescriptions:   .  acyclovir (ZOVIRAX) 400 MG tablet, Take 400 mg by mouth every 4 (four) hours while awake., Disp: , Rfl:   .  atorvastatin (LIPITOR) 20 MG tablet, atorvastatin 20 mg tablet  TK 1 T PO D, Disp: , Rfl:   .  b complex vitamins tablet, Take 1 tablet by mouth daily., Disp: , Rfl:    .  insulin aspart (NOVOLOG) 100 UNIT/ML injection, On insulin pump 18 - 25 units/day, Disp: 30 mL, Rfl: 3  .  insulin lispro protamine-lispro 50-50 MIXTURE (HUMALOG MIX 50-50) (50-50) 100 UNIT/ML Suspension injection, Inject into the skin 2 (two) times daily before meals., Disp: , Rfl:   .  levothyroxine (SYNTHROID, LEVOTHROID) 137 MCG tablet, Take 137 mcg by mouth every morning before breakfast., Disp: , Rfl:   .  lisinopril (PRINIVIL,ZESTRIL) 10 MG tablet, Take 10 mg by mouth daily., Disp: , Rfl:   .  Omega-3 Fatty Acids (FISH OIL) 1000 MG Cap capsule, Take 1 capsule by mouth 2 (two) times daily., Disp: , Rfl:   .  vitamin E 1000 UNIT capsule, Take 1,000 Units by mouth daily., Disp: , Rfl:     Review of Systems     Constitutional: Negative for fevers and chills  Skin: No rash or lesions  Respiratory: Negative for cough, wheezing, or hemoptysis  Cardiovascular: Negative for chest pain and dyspnea  Gastrointestinal: Negative for abdominal pain, nausea, vomiting and diarrhea  Musculoskeletal: See HPI, spinal issues, arthritis.  Genitourinary: Negative for dysuria  All other systems were reviewed and are negative except what  is stated in the HPI    Physical Exam     Vitals:    05/13/18 1600   BP: 126/76   Pulse: 76       Body mass index is 25.09 kg/m.    General: Patient appears their stated age, well-nourished. Alert and in no apparent distress.  Lungs: Respiratory effort unlabored, chest expansion symmetric.  Cardiac: RRR, right carotid bruits, no JVD.   Abd: Soft, nondistended, nontender. No guarding or rebound, No mid line pulsatile mass   Extremities:Full ROM, symmetric, warm  Ischemic changes not present, Ulceration not present,  Gangrene not present, Peripheral edema: No  Pulses: Right: Femoral present Popliteal present, DP present, PT only on doppler  Pulses: Left: Femoral present, Popliteal present, DP present, PT only on doppler  Veins: Varicose veins No, hyperpigmentation No, lipo-dermatosclerosis  No  Skin: clean dry and intact  Neuro: Good insight and judgment, oriented to person, place, and time CN II-XII intact, gross motor and sensory intact    Labs     CBC: No results found for: WBC, RBC, HGB, HCT, MCV, MCHC, RDW, PLT    CMP: No results found for: NA, K, CL, CO2, GLU, BUN, CREATININE, PROT, BILITOT, ALKPHOS, AST, ALT, ANIONGAP    Lipid Panel No results found for: CHOL, TRIG, HDL    Coags: No results found for: PT, INR, PTT    Imaging     A bilateral lower extremity arterial duplex reveals less than 50 percent stenosis in the right superficial femoral artery.  There is greater than 70 percent stenosis in the left distal femoral artery with a peak systolic velocity of 300 cm/s.  The right posterior tibial artery is occluded.  The right ABI is 1.15 and the left ABI is 1.11.    Carotid duplex performed in the office today reveals less than 50 percent stenosis in the left internal carotid artery.  There is 80-99 percent stenosis in the right internal carotid artery with a peak systolic velocity of 487 cm/s and end-diastolic velocity of 184 cm/s.    Assessment and Plan       1. Carotid stenosis, asymptomatic, right  CT Angiogram Head Neck   2. Peripheral vascular disease       Based on the information above, the patient's complaints of claudication of the lower extremities is neurogenic and not vascular in nature.  We have suggested the patient return to her spinal surgeon for further evaluation in regards to this.    Of note, the patient has critical stenosis of the right internal carotid artery.  Currently she is asymptomatic from this standpoint.  We will like the patient to obtain a CT angiogram of the head and neck to further evaluate these arteries.  And potentially plan for surgical intervention.  We have encouraged her to remain compliant with her medications, in the meantime.  And we will see her back in the very near future.  Finally, her symptoms of lightheadedness and dizziness are not typically  associated with carotid stenosis.    I hope this plan of care meets with your approval. Thank you for the opportunity to care for your patient.     Sincerely,     Dipankar Marcy Panning, M.D.     Jeanette Caprice, FNP-BC    [facility]  [address]  [place], [place] [address]  T [phone]  F [phone]

## 2018-05-19 ENCOUNTER — Telehealth (INDEPENDENT_AMBULATORY_CARE_PROVIDER_SITE_OTHER): Payer: Self-pay | Admitting: Family

## 2018-05-19 ENCOUNTER — Ambulatory Visit
Admission: RE | Admit: 2018-05-19 | Discharge: 2018-05-19 | Disposition: A | Discharge status: Home / Self Care | Payer: Commercial Managed Care - POS | Source: Ambulatory Visit | Attending: Family | Admitting: Family

## 2018-05-19 ENCOUNTER — Ambulatory Visit (INDEPENDENT_AMBULATORY_CARE_PROVIDER_SITE_OTHER): Payer: 59 | Admitting: Specialist

## 2018-05-19 ENCOUNTER — Other Ambulatory Visit: Payer: Self-pay

## 2018-05-19 DIAGNOSIS — I6521 Occlusion and stenosis of right carotid artery: Secondary | ICD-10-CM

## 2018-05-19 DIAGNOSIS — I6523 Occlusion and stenosis of bilateral carotid arteries: Secondary | ICD-10-CM | POA: Insufficient documentation

## 2018-05-19 LAB — WHOLE BLOOD CREATININE WITH GFR POCT
GFR POCT: >60 mL/min/{1.73_m2} (ref >60)
Whole Blood Creatinine POCT: 0.9 mg/dL (ref 0.5–1.1)

## 2018-05-19 MED ORDER — IOHEXOL 350 MG/ML IV SOLN
100.00 mL | Freq: Once | INTRAVENOUS | Status: AC | PRN
Start: 2018-05-19 — End: 2018-05-19
  Administered 2018-05-19: 13:00:00 100 mL via INTRAVENOUS

## 2018-05-19 NOTE — Telephone Encounter (Signed)
Spoke with someone in radiology central scheduling who stated that there is no issue with the order and that appt is scheduled for CTA head and neck.

## 2018-05-19 NOTE — Telephone Encounter (Signed)
Called pt who stated there was an issue with order for CT scan and that she was told there may be an issue getting it covered.  I called [facility] scheduling  - no answer unable to leave message.  I then called [facility] and was transferred to voicemail.  Left detailed message requesting call back to ext [phone] to let us know what needs to be changed with order.  EPIC order is for CT Angiogram Head Neck    Emergency  Hospital Calling? NO  Hospital PT wld go to? UNSURE  From: Doreatha Massed. Jearld Shines INFORMED CHOICE TEAM  Tel#: [phone]  Pt. : [name] [name]  DOB : 05/01/1966 (MM/DD/YYYY)  DOR : UNSURE  Msg : DR Jeanette Caprice. CT SCAN ONLY  SAYS ITS FOR THE HEAD BUT PATIENT  SAYS IT NEEDS TO BE FOR THE EHAD  AND NECK. NEEDS A CALL BACK.  30 Minute Callback :  O/C : OFFICE CLOSED  Reach per Member Instructions :    *New secure message on the SM+ app from  [employer]*    Caller ID: [phone]  Name: [phone]   ACTIVITIES   1) 05/18/2018 4:02:00 PM PRM RECORD   2) 05/18/2018 4:07:00 PM PRM EDIT

## 2018-05-20 ENCOUNTER — Ambulatory Visit (INDEPENDENT_AMBULATORY_CARE_PROVIDER_SITE_OTHER): Payer: Commercial Managed Care - POS | Admitting: Specialist

## 2018-05-20 ENCOUNTER — Telehealth (INDEPENDENT_AMBULATORY_CARE_PROVIDER_SITE_OTHER): Payer: Self-pay | Admitting: Family

## 2018-05-20 ENCOUNTER — Encounter (INDEPENDENT_AMBULATORY_CARE_PROVIDER_SITE_OTHER): Payer: Self-pay | Admitting: Specialist

## 2018-05-20 VITALS — BP 121/76 | HR 73 | Ht 68.0 in | Wt 165.0 lb

## 2018-05-20 DIAGNOSIS — I739 Peripheral vascular disease, unspecified: Secondary | ICD-10-CM

## 2018-05-20 DIAGNOSIS — I6521 Occlusion and stenosis of right carotid artery: Secondary | ICD-10-CM

## 2018-05-20 DIAGNOSIS — Z01818 Encounter for other preprocedural examination: Secondary | ICD-10-CM

## 2018-05-20 NOTE — H&P (Signed)
[facility]    Chief Complaint   Patient presents with   . Follow-up     CTA     History of Present Illness     This is a 52 year old female who returns the office today for 1 week follow-up appointment after obtaining a CTA of the head and neck.  This imaging was performed for high-grade stenosis noted on carotid duplex of the right internal carotid artery.  The patient continues to remain asymptomatic from a neurological standpoint.  She denies a history of stroke or transient ischemic attack.  She has been started on aspirin, and does take atorvastatin.  She is a former smoker.    She denies any history of cardiac issues.  She does have evidence of changes of her bilateral lower extremities, in regards to her artery perfusion.  However, she is well compensated with normal ankle-brachial indexes bilaterally.    Past Medical History     Past Medical History:   Diagnosis Date   . Chronic back pain    . Osteomyelitis of foot     R+ foot s/p 5th toe and lateral bone surgery       Past Surgical History:   Procedure Laterality Date   . R+ foot 5th toe/metatarsal amputation  12/30/2016       Allergies     No Known Allergies    Medications       Current Outpatient Prescriptions:   .  acyclovir (ZOVIRAX) 400 MG tablet, Take 400 mg by mouth every 4 (four) hours while awake., Disp: , Rfl:   .  atorvastatin (LIPITOR) 20 MG tablet, atorvastatin 20 mg tablet  TK 1 T PO D, Disp: , Rfl:   .  b complex vitamins tablet, Take 1 tablet by mouth daily., Disp: , Rfl:   .  insulin aspart (NOVOLOG) 100 UNIT/ML injection, On insulin pump 18 - 25 units/day, Disp: 30 mL, Rfl: 3  .  insulin lispro protamine-lispro 50-50 MIXTURE (HUMALOG MIX 50-50) (50-50) 100 UNIT/ML Suspension injection, Inject into the skin 2 (two) times daily before meals., Disp: , Rfl:   .  levothyroxine (SYNTHROID, LEVOTHROID) 137 MCG tablet, Take 137 mcg by mouth every morning before breakfast., Disp: , Rfl:   .  lisinopril (PRINIVIL,ZESTRIL) 10 MG tablet,  Take 10 mg by mouth daily., Disp: , Rfl:   .  Omega-3 Fatty Acids (FISH OIL) 1000 MG Cap capsule, Take 1 capsule by mouth 2 (two) times daily., Disp: , Rfl:   .  vitamin E 1000 UNIT capsule, Take 1,000 Units by mouth daily., Disp: , Rfl:   No current facility-administered medications for this visit.     Review of Systems     Constitutional: Negative for fevers and chills  Skin: No rash or lesions  Respiratory: Negative for cough, wheezing, or hemoptysis  Cardiovascular: Negative for chest pain and dyspnea  Gastrointestinal: Negative for abdominal pain, nausea, vomiting and diarrhea  Musculoskeletal: See HPI, spinal issues, arthritis.  Genitourinary: Negative for dysuria  All other systems were reviewed and are negative except what is stated in the HPI    Physical Exam     Vitals:    05/20/18 1130   BP: 121/76   Pulse:        Body mass index is 25.09 kg/m.    General: Patient appears their stated age, well-nourished. Alert and in no apparent distress.  Lungs: Respiratory effort unlabored, chest expansion symmetric.  Cardiac: RRR, right carotid bruits, no JVD.  Abd: Soft, nondistended, nontender. No guarding or rebound, No mid line pulsatile mass   Extremities:Full ROM, symmetric, warm  Ischemic changes not present, Ulceration not present,  Gangrene not present, Peripheral edema: No  Pulses: Right: Femoral present Popliteal present, DP present, PT only on doppler  Pulses: Left: Femoral present, Popliteal present, DP present, PT only on doppler  Veins: Varicose veins No, hyperpigmentation No, lipo-dermatosclerosis No  Skin: clean dry and intact  Neuro: Good insight and judgment, oriented to person, place, and time CN II-XII intact, gross motor and sensory intact    Labs     CBC: No results found for: WBC, RBC, HGB, HCT, MCV, MCHC, RDW, PLT    CMP: No results found for: NA, K, CL, CO2, GLU, BUN, CREATININE, PROT, BILITOT, ALKPHOS, AST, ALT, ANIONGAP    Lipid Panel No results found for: CHOL, TRIG, HDL    Coags:  No results found for: PT, INR, PTT    Imaging     CTA of the neck revealed 42 percent stenosis at the origin of the right internal carotid artery.  There is 11 percent stenosis at the origin of the left internal carotid artery.  There are mild atherosclerotic changes in the right vertebral artery without hemodynamically significant stenosis.  The left vertebral artery is normal in appearance.    The carotid duplex had suggested closer to 80-99 percent stenosis of the right internal carotid artery with a peak systolic velocity of 487.32 cm/s and an end-diastolic velocity of 184.64 cm/s.  This was performed in our office on May 15.    Assessment and Plan       1. Carotid stenosis, asymptomatic, right     2. Peripheral vascular disease       Based on the information above, there is conflicting findings versus a CTA of the neck and the carotid duplex.  Given the abnormal variance in the stenosis in the right internal carotid artery, we believe the patient should undergo a carotid angiogram.  This will give [place] a definitive diagnosis of stenosis of the right internal carotid artery.  Based on the findings of this diagnostic angiogram.  We will move forward with surgical intervention if indicated. All risks and benefits of the therapeutic plan were discussed with the patient, including but not limited to; bleeding, limb loss, neurologic injury, renal failure, myocardial infarction and death. She wishes to proceed with the plan as outlined above.  Our office will call the patient in the near future to schedule the surgical procedure.    In the meantime, the patient will need to obtain a complete blood count and complete metabolic panel within 30 days prior to the carotid angiogram.  The patient is a type I diabetic and he utilizes an insulin pump and provide additional insulin based on her glucose throughout the day.  She will manage her glucose overnight while nothing by mouth, but will not provide any supplemental  insulin.    I hope this plan of care meets with your approval. Thank you for the opportunity to care for your patient.     Sincerely,     Dipankar Marcy Panning, M.D.     Jeanette Caprice, FNP-BC    [facility]  [address]  [place], [place] [address]  T [phone]  F [phone]

## 2018-05-20 NOTE — Telephone Encounter (Signed)
Received additional call from [employer] regarding order.  Advised that the order had been entered correctly and should have been CTA of the head and neck.  She will follow-up with her team to make sure authorization was done correctly.

## 2018-05-20 NOTE — Telephone Encounter (Signed)
Day of Surgery Medication Instructions:  - You must have nothing to eat or drink from midnight onwards the morning of surgery.   - Do not take the following medications the morning of surgery:   - Vitamins or supplements   - Diabetes Medications: No supplemental insulin.   - You can take the following medications on your normal schedule. Anything taken in the evening can be taken as normal the night before your procedure. Anything taken in the morning can be taken the morning of your surgery with small sips of water only.   - Acyclovir    - Atorvastatin   - Synthroid   - Lisinopril   - Aspirin    Patient verbalized understanding of instructions and had no further questions or concerns at this time.

## 2018-05-20 NOTE — Progress Notes (Signed)
[facility]    Chief Complaint   Patient presents with   . Follow-up     CTA     History of Present Illness     This is a 52-year-old female who returns the office today for 1 week follow-up appointment after obtaining a CTA of the head and neck.  This imaging was performed for high-grade stenosis noted on carotid duplex of the right internal carotid artery.  The patient continues to remain asymptomatic from a neurological standpoint.  She denies a history of stroke or transient ischemic attack.  She has been started on aspirin, and does take atorvastatin.  She is a former smoker.    She denies any history of cardiac issues.  She does have evidence of changes of her bilateral lower extremities, in regards to her artery perfusion.  However, she is well compensated with normal ankle-brachial indexes bilaterally.    Past Medical History     Past Medical History:   Diagnosis Date   . Chronic back pain    . Osteomyelitis of foot     R+ foot s/p 5th toe and lateral bone surgery       Past Surgical History:   Procedure Laterality Date   . R+ foot 5th toe/metatarsal amputation  12/30/2016       Allergies     No Known Allergies    Medications       Current Outpatient Prescriptions:   .  acyclovir (ZOVIRAX) 400 MG tablet, Take 400 mg by mouth every 4 (four) hours while awake., Disp: , Rfl:   .  atorvastatin (LIPITOR) 20 MG tablet, atorvastatin 20 mg tablet  TK 1 T PO D, Disp: , Rfl:   .  b complex vitamins tablet, Take 1 tablet by mouth daily., Disp: , Rfl:   .  insulin aspart (NOVOLOG) 100 UNIT/ML injection, On insulin pump 18 - 25 units/day, Disp: 30 mL, Rfl: 3  .  insulin lispro protamine-lispro 50-50 MIXTURE (HUMALOG MIX 50-50) (50-50) 100 UNIT/ML Suspension injection, Inject into the skin 2 (two) times daily before meals., Disp: , Rfl:   .  levothyroxine (SYNTHROID, LEVOTHROID) 137 MCG tablet, Take 137 mcg by mouth every morning before breakfast., Disp: , Rfl:   .  lisinopril (PRINIVIL,ZESTRIL) 10 MG tablet,  Take 10 mg by mouth daily., Disp: , Rfl:   .  Omega-3 Fatty Acids (FISH OIL) 1000 MG Cap capsule, Take 1 capsule by mouth 2 (two) times daily., Disp: , Rfl:   .  vitamin E 1000 UNIT capsule, Take 1,000 Units by mouth daily., Disp: , Rfl:   No current facility-administered medications for this visit.     Review of Systems     Constitutional: Negative for fevers and chills  Skin: No rash or lesions  Respiratory: Negative for cough, wheezing, or hemoptysis  Cardiovascular: Negative for chest pain and dyspnea  Gastrointestinal: Negative for abdominal pain, nausea, vomiting and diarrhea  Musculoskeletal: See HPI, spinal issues, arthritis.  Genitourinary: Negative for dysuria  All other systems were reviewed and are negative except what is stated in the HPI    Physical Exam     Vitals:    05/20/18 1130   BP: 121/76   Pulse:        Body mass index is 25.09 kg/m.    General: Patient appears their stated age, well-nourished. Alert and in no apparent distress.  Lungs: Respiratory effort unlabored, chest expansion symmetric.  Cardiac: RRR, right carotid bruits, no JVD.     Abd: Soft, nondistended, nontender. No guarding or rebound, No mid line pulsatile mass   Extremities:Full ROM, symmetric, warm  Ischemic changes not present, Ulceration not present,  Gangrene not present, Peripheral edema: No  Pulses: Right: Femoral present Popliteal present, DP present, PT only on doppler  Pulses: Left: Femoral present, Popliteal present, DP present, PT only on doppler  Veins: Varicose veins No, hyperpigmentation No, lipo-dermatosclerosis No  Skin: clean dry and intact  Neuro: Good insight and judgment, oriented to person, place, and time CN II-XII intact, gross motor and sensory intact    Labs     CBC: No results found for: WBC, RBC, HGB, HCT, MCV, MCHC, RDW, PLT    CMP: No results found for: NA, K, CL, CO2, GLU, BUN, CREATININE, PROT, BILITOT, ALKPHOS, AST, ALT, ANIONGAP    Lipid Panel No results found for: CHOL, TRIG, HDL    Coags:  No results found for: PT, INR, PTT    Imaging     CTA of the neck revealed 42 percent stenosis at the origin of the right internal carotid artery.  There is 11 percent stenosis at the origin of the left internal carotid artery.  There are mild atherosclerotic changes in the right vertebral artery without hemodynamically significant stenosis.  The left vertebral artery is normal in appearance.    The carotid duplex had suggested closer to 80-99 percent stenosis of the right internal carotid artery with a peak systolic velocity of 487.32 cm/s and an end-diastolic velocity of 184.64 cm/s.  This was performed in our office on May 15.    Assessment and Plan       1. Carotid stenosis, asymptomatic, right     2. Peripheral vascular disease       Based on the information above, there is conflicting findings versus a CTA of the neck and the carotid duplex.  Given the abnormal variance in the stenosis in the right internal carotid artery, we believe the patient should undergo a carotid angiogram.  This will give us a definitive diagnosis of stenosis of the right internal carotid artery.  Based on the findings of this diagnostic angiogram.  We will move forward with surgical intervention if indicated. All risks and benefits of the therapeutic plan were discussed with the patient, including but not limited to; bleeding, limb loss, neurologic injury, renal failure, myocardial infarction and death. She wishes to proceed with the plan as outlined above.  Our office will call the patient in the near future to schedule the surgical procedure.    In the meantime, the patient will need to obtain a complete blood count and complete metabolic panel within 30 days prior to the carotid angiogram.  The patient is a type I diabetic and he utilizes an insulin pump and provide additional insulin based on her glucose throughout the day.  She will manage her glucose overnight while nothing by mouth, but will not provide any supplemental  insulin.    I hope this plan of care meets with your approval. Thank you for the opportunity to care for your patient.     Sincerely,     Dipankar Mukherjee, M.D.     Lumina Gitto, FNP-BC    [facility]  [address]  [place], [place] [address]  T [phone]  F [phone]

## 2018-05-20 NOTE — Telephone Encounter (Signed)
[facility]    Procedure: Diagnostic Carotid Angiogram  Surgeon: Dr. Marcy Panning  Date: pending  Hospital: [facility]  Admission: Same Day    Labs:  Pending CBC and CMP  Orders in EPIC    Medications: see pre-op instructions below  NPO after midnight  Make take AM meds with sips of water    Clearances:  Not indicated    Imaging:  Not indicated    Patient has ride home with her spouse.

## 2018-05-27 NOTE — Telephone Encounter (Signed)
Patient having the procedure done in [facility] June 08, 2018 due to her Insurance only accepted there.  She said she will bring the results back to Dr. Marcy Panning.

## 2018-07-07 DIAGNOSIS — R42 Dizziness and giddiness: Secondary | ICD-10-CM | POA: Insufficient documentation

## 2018-08-21 ENCOUNTER — Ambulatory Visit (INDEPENDENT_AMBULATORY_CARE_PROVIDER_SITE_OTHER): Payer: Self-pay | Admitting: Nurse Practitioner

## 2018-10-13 ENCOUNTER — Other Ambulatory Visit (INDEPENDENT_AMBULATORY_CARE_PROVIDER_SITE_OTHER): Payer: Self-pay

## 2018-10-15 ENCOUNTER — Ambulatory Visit (INDEPENDENT_AMBULATORY_CARE_PROVIDER_SITE_OTHER): Payer: Self-pay

## 2019-01-04 DIAGNOSIS — Z89439 Acquired absence of unspecified foot: Secondary | ICD-10-CM | POA: Insufficient documentation

## 2019-01-04 DIAGNOSIS — I951 Orthostatic hypotension: Secondary | ICD-10-CM | POA: Insufficient documentation

## 2019-02-18 ENCOUNTER — Ambulatory Visit (INDEPENDENT_AMBULATORY_CARE_PROVIDER_SITE_OTHER): Payer: Commercial Managed Care - POS | Admitting: Neurological Surgery

## 2019-02-25 ENCOUNTER — Ambulatory Visit (INDEPENDENT_AMBULATORY_CARE_PROVIDER_SITE_OTHER): Payer: Commercial Managed Care - POS | Admitting: Neurological Surgery

## 2019-02-25 ENCOUNTER — Encounter (INDEPENDENT_AMBULATORY_CARE_PROVIDER_SITE_OTHER): Payer: Self-pay | Admitting: Neurological Surgery

## 2019-02-25 VITALS — BP 132/74 | HR 80 | Temp 97.5°F | Ht 67.5 in | Wt 170.0 lb

## 2019-02-25 DIAGNOSIS — M4800 Spinal stenosis, site unspecified: Secondary | ICD-10-CM

## 2019-02-25 DIAGNOSIS — M545 Low back pain: Secondary | ICD-10-CM

## 2019-02-25 DIAGNOSIS — M5410 Radiculopathy, site unspecified: Secondary | ICD-10-CM

## 2019-02-25 NOTE — Progress Notes (Signed)
Review of Systems   Constitutional: Positive for chills and fatigue.   HENT: Positive for trouble swallowing.    Eyes: Positive for pain and visual disturbance.   Respiratory: Positive for cough.    Cardiovascular: Negative.    Gastrointestinal: Positive for abdominal distention, abdominal pain, diarrhea, nausea and vomiting.   Endocrine: Negative.    Genitourinary: Negative.    Musculoskeletal: Positive for back pain, gait problem and myalgias.   Skin: Negative.    Allergic/Immunologic: Negative.    Neurological: Positive for dizziness, weakness, light-headedness, numbness and headaches.   Psychiatric/Behavioral: Positive for decreased concentration, dysphoric mood and sleep disturbance.         Taken by: Gilles Chiquito, LPN

## 2019-02-27 ENCOUNTER — Encounter (INDEPENDENT_AMBULATORY_CARE_PROVIDER_SITE_OTHER): Payer: Self-pay | Admitting: Neurological Surgery

## 2019-02-27 NOTE — Progress Notes (Signed)
Subjective:           Patient ID: [name] [name] is a 53 y.o. female here for Initial Consult (lower back pain)  .     HPI   [name] [name] comes in today for neurosurgical follow-up I last saw her approximately 6 months ago at [facility].  She underwent L4-S1 laminectomy and fusion by me December 2018 and has done excellent from that surgery.  All of her preoperative right-sided leg pain has resolved.  She has a history of diabetes with a insulin pump and has a history of tobacco smoking.  The patient has been doing very well until recently when she is bent down to get something from the floor and immediately was stricken by disabling low back buttock pain that radiates down the lower extremity.  She does not recall any other inciting factors  and also does not recall any trauma.      On exam she is neurologically stable there is no evidence of atrophy there is.  There is no significant pain on palpation of the lower back.  She is walking with a stooped forward posture and antalgic gait because of the significant pain.  There are no new images.    Review of Systems   Constitutional: Positive for chills and fatigue.   HENT: Positive for trouble swallowing.    Eyes: Positive for pain and visual disturbance.   Respiratory: Positive for cough.    Cardiovascular: Negative.    Gastrointestinal: Positive for abdominal distention, abdominal pain, diarrhea, nausea and vomiting.   Endocrine: Negative.    Genitourinary: Negative.    Musculoskeletal: Positive for back pain, gait problem and myalgias.   Skin: Negative.    Allergic/Immunologic: Negative.    Neurological: Positive for dizziness, weakness, light-headedness, numbness and headaches.   Psychiatric/Behavioral: Positive for decreased concentration, dysphoric mood and sleep disturbance.  Current Outpatient Medications on File Prior to Visit   Medication Sig Dispense Refill    acyclovir (ZOVIRAX) 800 MG tablet Take 800 mg by mouth daily      aspirin EC 81 MG  EC tablet Take 81 mg by mouth daily      atorvastatin (LIPITOR) 20 MG tablet atorvastatin 20 mg tablet   TK 1 T PO D      b complex vitamins tablet Take 1 tablet by mouth daily.      Cholecalciferol (VITAMIN D3) 2000 UNIT capsule Take 2,000 Units by mouth daily      HUMALOG 100 UNIT/ML injection Inject 100 mLs into the skin as needed      insulin aspart (NOVOLOG) 100 UNIT/ML injection On insulin pump 18 - 25 units/day 30 mL 3    Lancets (FREESTYLE) lancets 1 each by Other route as needed      levothyroxine (SYNTHROID, LEVOTHROID) 125 MCG tablet Take 125 mcg by mouth daily      Omega-3 Fatty Acids (FISH OIL) 1000 MG Cap capsule Take 1 capsule by mouth 2 (two) times daily.      raNITIdine (ZANTAC) 150 MG tablet Take 150 mg by mouth 2 (two) times daily as needed      vitamin E 1000 UNIT capsule Take 1,000 Units by mouth daily.       No current facility-administered medications on file prior to visit.        Objective:      Awake alert oriented x3  Ambulates with a stooped forward posture with an antalgic gait  No muscle atrophy and sensation is preserved no tenderness  to palpation in the lower back  Motor strength is 5 out of 5 but limited pain limited with pain          Assessment:   1.  Status post lumbar fusion December 2018 has been doing great  2.  Recent onset of significant right lower back pain with radiculopathy no trauma      Plan:     1.  Referral to Dr. Silas [place] for pain management     2.  Lumbar x-rays, flexion-extension          Orders Placed This Encounter   Procedures    XR Lumbar Spine AP Lateral Flexion And Extension     Standing Status:   Future     Standing Expiration Date:   05/26/2019     Order Specific Question:   Is the patient pregnant?     Answer:   No     Order Specific Question:   Reason for Exam:     Answer:   evaluate fusion; history of lumbar fusion    Ambulatory referral to Pain Clinic     Referral Priority:   Routine     Referral Type:   Consultation     Referral Reason:    Specialty Services Required     Referred to Provider:   Amie Critchley, MD     Requested Specialty:   Pain Medicine     Number of Visits Requested:   1           Delena Bali MD  Neurosurgery  [phone]

## 2019-03-08 ENCOUNTER — Other Ambulatory Visit: Payer: Self-pay | Admitting: Physical Medicine & Rehabilitation

## 2019-03-08 DIAGNOSIS — M961 Postlaminectomy syndrome, not elsewhere classified: Secondary | ICD-10-CM

## 2019-03-08 DIAGNOSIS — M47812 Spondylosis without myelopathy or radiculopathy, cervical region: Secondary | ICD-10-CM

## 2019-03-08 DIAGNOSIS — M549 Dorsalgia, unspecified: Secondary | ICD-10-CM

## 2019-03-11 ENCOUNTER — Ambulatory Visit: Payer: Commercial Managed Care - POS | Attending: Neurological Surgery

## 2019-03-11 ENCOUNTER — Encounter (INDEPENDENT_AMBULATORY_CARE_PROVIDER_SITE_OTHER): Payer: Self-pay

## 2019-03-11 ENCOUNTER — Ambulatory Visit (INDEPENDENT_AMBULATORY_CARE_PROVIDER_SITE_OTHER): Payer: Self-pay | Admitting: Nurse Practitioner

## 2019-03-11 DIAGNOSIS — M4800 Spinal stenosis, site unspecified: Secondary | ICD-10-CM

## 2019-03-11 LAB — VAHRT HISTORIC LVEF: Ejection Fraction: 60 %

## 2019-03-20 ENCOUNTER — Ambulatory Visit: Payer: Self-pay

## 2019-03-25 ENCOUNTER — Ambulatory Visit (INDEPENDENT_AMBULATORY_CARE_PROVIDER_SITE_OTHER): Payer: Commercial Managed Care - POS | Admitting: Neurological Surgery

## 2019-03-25 ENCOUNTER — Encounter (INDEPENDENT_AMBULATORY_CARE_PROVIDER_SITE_OTHER): Payer: Self-pay | Admitting: Neurological Surgery

## 2019-03-25 ENCOUNTER — Telehealth (INDEPENDENT_AMBULATORY_CARE_PROVIDER_SITE_OTHER): Payer: Commercial Managed Care - POS | Admitting: Neurological Surgery

## 2019-03-25 DIAGNOSIS — M5440 Lumbago with sciatica, unspecified side: Secondary | ICD-10-CM

## 2019-03-25 NOTE — Progress Notes (Signed)
Subjective:         Patient ID: Anne Boltz is a 53 y.o. female here for No chief complaint on file.  Marland Kitchen    HPI    Review of Systems  Current Outpatient Medications on File Prior to Visit   Medication Sig Dispense Refill    acyclovir (ZOVIRAX) 800 MG tablet Take 800 mg by mouth daily      aspirin EC 81 MG EC tablet Take 81 mg by mouth daily      atorvastatin (LIPITOR) 20 MG tablet atorvastatin 20 mg tablet   TK 1 T PO D      b complex vitamins tablet Take 1 tablet by mouth daily.      Cholecalciferol (VITAMIN D3) 2000 UNIT capsule Take 2,000 Units by mouth daily      HUMALOG 100 UNIT/ML injection Inject 100 mLs into the skin as needed      insulin aspart (NOVOLOG) 100 UNIT/ML injection On insulin pump 18 - 25 units/day 30 mL 3    Lancets (FREESTYLE) lancets 1 each by Other route as needed      levothyroxine (SYNTHROID, LEVOTHROID) 125 MCG tablet Take 125 mcg by mouth daily      Omega-3 Fatty Acids (FISH OIL) 1000 MG Cap capsule Take 1 capsule by mouth 2 (two) times daily.      raNITIdine (ZANTAC) 150 MG tablet Take 150 mg by mouth 2 (two) times daily as needed      vitamin E 1000 UNIT capsule Take 1,000 Units by mouth daily.       No current facility-administered medications on file prior to visit.      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     Past Surgical History:   Procedure Laterality Date    R+ foot 5th toe/metatarsal amputation  12/30/2016     Social History     Socioeconomic History    Marital status: Married     Spouse name: Not on  file    Number of children: Not on file    Years of education: Not on file    Highest education level: Not on file   Occupational History    Not on file   Social Needs    Financial resource strain: Not on file    Food insecurity:     Worry: Not on file     Inability: Not on file    Transportation needs:     Medical: Not on file     Non-medical: Not on file   Tobacco Use    Smoking status: Former Smoker     Years: 30.00     Last attempt to quit: 2018     Years since quitting: 2.2    Smokeless tobacco: Never Used   Substance and Sexual Activity    Alcohol use: Yes     Alcohol/week: 1.0 standard drinks     Types: 1 Glasses of wine per week    Drug use: Yes    Sexual activity: Not on file  Lifestyle    Physical activity:     Days per week: Not on file     Minutes per session: Not on file    Stress: Not on file   Relationships    Social connections:     Talks on phone: Not on file     Gets together: Not on file     Attends religious service: Not on file     Active member of club or organization: Not on file     Attends meetings of clubs or organizations: Not on file     Relationship status: Not on file    Intimate partner violence:     Fear of current or ex partner: Not on file     Emotionally abused: Not on file     Physically abused: Not on file     Forced sexual activity: Not on file   Other Topics Concern    Not on file   Social History Narrative    Not on file     Family History   Problem Relation Age of Onset    Dementia Mother     Diabetes Mother     Heart disease Father              Objective:      Physical Exam Neurologic Exam          Assessment:         No diagnosis found.    Plan:      No orders of the defined types were placed in this encounter.              Jerilynn Mages, MD

## 2019-03-25 NOTE — Telephone Encounter (Addendum)
TELEPHONE VISIT              CLINICAL SUMMARY        Verbal Consent      Verbal consent has been obtained from the patient to conduct a telephone visit encounter to minimize exposure to COVID-19: {YES      Chief Complaint:      Back pain, acute      Problem List, Medications, and Allergies reviewed: {YES      HPI:  This is a 53 year old female status post L4 S1 fusion 18 months ago by me presents with acute low back pain without any trauma.  Since the last visit on [place] I recommended getting x-rays.  I reviewed the lumbar x-rays with the patient over the phone and the lumbar x-rays demonstrate excellent positioning of the hardware no no deformity no fracture no hardware failure.  There is also evidence of robust fusion in the intertransverse area.    The patient reports decreased pain.  She is also seeing Dr. Roger Shelter for pain management.  He has referred her for several MRIs but they are undergoing insurance approval.  She will follow-up with Dr. Roger Shelter in a few weeks.  At this time no further surgical recommendation.  All of her questions were addressed        Assessment/Plan:      Acute back pain now resolving  Follow-up with Dr. Sheral Flow MD  Neurosurgery  [phone]

## 2019-05-04 ENCOUNTER — Encounter (INDEPENDENT_AMBULATORY_CARE_PROVIDER_SITE_OTHER): Payer: Self-pay | Admitting: Neurological Surgery

## 2019-05-04 NOTE — Progress Notes (Signed)
TELEMEDICINE VISIT              CLINICAL SUMMARY        Verbal Consent      Verbal consent has been obtained from the patient to conduct a telephone visit encounter to minimize exposure to COVID-19: {YES      Chief Complaint:      Back pain      Problem List, Medications, and Allergies reviewed: {YES      HPI:  HPI  This is a followup from a visit several weeks ago. The back pain has improved .  We reviewed xrays which show hardware I  Adequate position without malfunction; there is evidence of fusion.  The patient was reassured and all questions addressed.      Assessment/Plan:    Doing well s/p lumbar fusion at 1 year  F/U 6 months  Time spent in medical discussion: 15 mins    Jerilynn Mages, MD

## 2019-05-18 ENCOUNTER — Ambulatory Visit (INDEPENDENT_AMBULATORY_CARE_PROVIDER_SITE_OTHER): Payer: Self-pay | Admitting: Cardiovascular Disease

## 2019-05-28 ENCOUNTER — Ambulatory Visit
Admission: RE | Admit: 2019-05-28 | Discharge: 2019-05-28 | Disposition: A | Discharge status: Home / Self Care | Payer: Commercial Managed Care - POS | Source: Ambulatory Visit | Attending: Physical Medicine & Rehabilitation | Admitting: Physical Medicine & Rehabilitation

## 2019-05-28 DIAGNOSIS — M5126 Other intervertebral disc displacement, lumbar region: Secondary | ICD-10-CM | POA: Insufficient documentation

## 2019-05-28 DIAGNOSIS — M47816 Spondylosis without myelopathy or radiculopathy, lumbar region: Secondary | ICD-10-CM | POA: Insufficient documentation

## 2019-05-28 DIAGNOSIS — M48061 Spinal stenosis, lumbar region without neurogenic claudication: Secondary | ICD-10-CM | POA: Insufficient documentation

## 2019-05-28 DIAGNOSIS — Z9889 Other specified postprocedural states: Secondary | ICD-10-CM | POA: Insufficient documentation

## 2019-05-28 DIAGNOSIS — M961 Postlaminectomy syndrome, not elsewhere classified: Secondary | ICD-10-CM

## 2019-05-28 DIAGNOSIS — Z981 Arthrodesis status: Secondary | ICD-10-CM | POA: Insufficient documentation

## 2019-05-28 MED ORDER — GADOBUTROL 1 MMOL/ML IV SOLN
7.00 mL | Freq: Once | INTRAVENOUS | Status: AC | PRN
Start: 2019-05-28 — End: 2019-05-28
  Administered 2019-05-28: 7 mmol via INTRAVENOUS
  Filled 2019-05-28: qty 7.5

## 2019-06-10 ENCOUNTER — Encounter (INDEPENDENT_AMBULATORY_CARE_PROVIDER_SITE_OTHER): Payer: Self-pay | Admitting: Neurological Surgery

## 2019-06-10 ENCOUNTER — Ambulatory Visit (INDEPENDENT_AMBULATORY_CARE_PROVIDER_SITE_OTHER): Payer: Commercial Managed Care - POS | Admitting: Neurological Surgery

## 2019-06-10 VITALS — BP 90/60 | HR 62 | Temp 98.6°F | Resp 15 | Ht 68.0 in | Wt 165.0 lb

## 2019-06-10 DIAGNOSIS — M545 Low back pain: Secondary | ICD-10-CM

## 2019-06-10 DIAGNOSIS — Z9889 Other specified postprocedural states: Secondary | ICD-10-CM

## 2019-06-10 DIAGNOSIS — G546 Phantom limb syndrome with pain: Secondary | ICD-10-CM

## 2019-06-10 DIAGNOSIS — M5442 Lumbago with sciatica, left side: Secondary | ICD-10-CM | POA: Insufficient documentation

## 2019-06-10 DIAGNOSIS — G8929 Other chronic pain: Secondary | ICD-10-CM

## 2019-06-10 DIAGNOSIS — M47816 Spondylosis without myelopathy or radiculopathy, lumbar region: Secondary | ICD-10-CM

## 2019-06-10 DIAGNOSIS — Z981 Arthrodesis status: Secondary | ICD-10-CM

## 2019-06-10 NOTE — Progress Notes (Signed)
[facility] Neurosurgery  Follow-up Note    Date: 06/10/2019  Patient Name: [name] [name], [name] [name]   [id number]  Patient Care Team:  Willa Frater, [place] as PCP - General (Physician Assistant)  Doristine Section, MD as Consulting Physician (Endocrinology, Diabetes and Metabolism)    Previous Assessment and Plan:   I have personally reviewed the prior Assessment and Plan.     Doing well s/p lumbar fusion at 1 year  F/U 6 months      History of Present Illness:     [name] [name] is a 53 y.o. female who presents for a 1.5 years s/p L4-S1 laminectomy with lumbar fusion on 12/19/2017 (Dr. Rayann Heman).   Compared to six months ago, her symptoms have been the same since shortly after surgery.  Before surgery, she spent three months on the floor. After surgery, pain feels like she has been running a marathon, draining and fatigue all the time. The biggest challenge since surgery has been stamina and mobility. She can walk 50 yards or one city block, but not every day, and she has to stop to stand, sit or lean forward on the cart.   95% of her sitting on her stability ball. She uses a stability ball for chores.   She c/o right toe amputation and right achilles injury, thus the left leg carries mot of her weight and left hip flexor carries most of her weight when she walks or stand.   She has a h/o autonomic neuropathy that affects her bowel / bladder. Left upper hamstring burns all the time, feels like someone is holding a blow torch.     This is a followup from a visit several weeks ago. The back pain has improved .  We reviewed xrays which show hardware I  Adequate position without malfunction; there is evidence of fusion.  The patient was reassured and all questions addressed.  History was obtained from chart review and the patient.      Review of Systems:        Past Medical History:     Past Medical History:   Diagnosis Date    Chronic back pain     Osteomyelitis of foot     R+ foot s/p 5th toe and lateral bone  surgery       Past Surgical History:     Past Surgical History:   Procedure Laterality Date    R+ foot 5th toe/metatarsal amputation  12/30/2016       Family History:     Family History   Problem Relation Age of Onset    Dementia Mother     Diabetes Mother     Heart disease Father        Social History:     Social History     Socioeconomic History    Marital status: Married     Spouse name: Not on file    Number of children: Not on file    Years of education: Not on file    Highest education level: Not on file   Occupational History    Not on file   Social Needs    Financial resource strain: Not on file    Food insecurity     Worry: Not on file     Inability: Not on file    Transportation needs     Medical: Not on file     Non-medical: Not on file   Tobacco Use    Smoking status: Former Smoker  Years: 30.00     Last attempt to quit: 2018     Years since quitting: 2.4    Smokeless tobacco: Never Used   Substance and Sexual Activity    Alcohol use: Yes     Alcohol/week: 1.0 standard drinks     Types: 1 Glasses of wine per week    Drug use: Yes    Sexual activity: Not on file   Lifestyle    Physical activity     Days per week: Not on file     Minutes per session: Not on file    Stress: Not on file   Relationships    Social connections     Talks on phone: Not on file     Gets together: Not on file     Attends religious service: Not on file     Active member of club or organization: Not on file     Attends meetings of clubs or organizations: Not on file     Relationship status: Not on file    Intimate partner violence     Fear of current or ex partner: Not on file     Emotionally abused: Not on file     Physically abused: Not on file     Forced sexual activity: Not on file   Other Topics Concern    Not on file   Social History Narrative    Not on file       Allergies:     No Known Allergies    Medications:     Current Outpatient Medications on File Prior to Visit   Medication Sig Dispense Refill     acyclovir (ZOVIRAX) 800 MG tablet Take 800 mg by mouth daily      aspirin EC 81 MG EC tablet Take 81 mg by mouth daily      atorvastatin (LIPITOR) 20 MG tablet atorvastatin 20 mg tablet   TK 1 T PO D      b complex vitamins tablet Take 1 tablet by mouth daily.      Cholecalciferol (VITAMIN D3) 2000 UNIT capsule Take 2,000 Units by mouth daily      HUMALOG 100 UNIT/ML injection Inject 100 mLs into the skin as needed      insulin aspart (NOVOLOG) 100 UNIT/ML injection On insulin pump 18 - 25 units/day 30 mL 3    Lancets (FREESTYLE) lancets 1 each by Other route as needed      levothyroxine (SYNTHROID, LEVOTHROID) 125 MCG tablet Take 125 mcg by mouth daily      Omega-3 Fatty Acids (FISH OIL) 1000 MG Cap capsule Take 1 capsule by mouth 2 (two) times daily.      raNITIdine (ZANTAC) 150 MG tablet Take 150 mg by mouth 2 (two) times daily as needed      vitamin E 1000 UNIT capsule Take 1,000 Units by mouth daily.       No current facility-administered medications on file prior to visit.        Vital Signs:     Vitals:    06/10/19 1014   BP: 90/60   Pulse: 62   Resp: 15   Temp: 98.6 F (37 C)       Physical Exam:     General: The patient was well developed and well nourished.  No acute distress. Cooperative with the examination  Neck: Trachea midline, no thyromegaly  Pulmonary: normal respiratory effort. No audible wheezing.   Cardiovascular: no diaphoresis  Abdomen: Soft,  non-tender, no HSM  Extremities: No pedal edema, normal in color  Skin: Normal temperature, no rash  Mental Status: The patient is awake, alert and oriented to person, place, and time.    Affect is normal. Fund of knowledge appropriate.   Recent and remote memory are intact. Attention span and concentration appear normal.  No delusions or hallucinations. Language function is normal.   There is no evidence of aphasia in conversational speech.  Cranial nerves:   -CN II: Visual fields full to bedside confrontation   -CN III, IV, VI: Pupils  equal, round, and reactive to light; extraocular movements intact; no ptosis                                          -CN V: Facial sensation intact in V1 through V3 distributions   -CN VII: Face symmetric   -CN VIII: Hearing intact to conversational speech   -CN IX, X: Palate elevates symmetrically; normal phonation   -CN XI: Symmetric full strength of sternocleidomastoid and trapezius muscles   -CN XII: Tongue protrudes midline  Motor: Muscle tone normal without spasticity or flaccidity. No atrophy.    No pronator drift.  Motor  R L  Deltoid  5 5  Bicep  5 5  Tricep  5 5  Grip  5 5  IO  5 5    Iliopsoas 5 5  Quad  5 5  Ham  5 5  Dorsi  5 5  Plantar  5 5    Sensory:   Light touch intact.  Temperature intact  Pinprick intact  Proprioception intact  Reflexes:     R L  Biceps  2+ 2+  Triceps 2+ 2+  BR  2+ 2+  Patellar 2+ 2+  Achilles 2+ 2+  No Hoffmann's sign bilaterally  No Clonus bilaterally  Coordination: No tremors  Gait: normal  Tandem walk intact  Romberg negative  Able to toe and heel walk  GCS 15      Labs:     No results found for: WBC, HGB, HCT, MCV, PLT  No results found for: NA, K, CL, CO2  No results found for: INR, PT  No results found for: BUN  No results found for: CREAT      Imaging:     I personally reviewed the imaging with the patient.     MRI LUMBAR SPINE WITHOUT AND WITH CONTRAST    HISTORY: Low back pain.    TECHNIQUE: Multiplanar MR imaging of the lumbar spine was performed on a  1.5 Tesla MRI scanner. Images were acquired prior to and following the  intravenous administration of 7 cc of Gadavist.    FINDINGS: There are postoperative changes of the lumbar spine with evidence  of laminectomy and instrumented posterior fusion at L4-L5 and L5-S1. There  is metallic susceptibility artifact related to bilateral spinal rods and  pedicle screws at the L4, L5 and S1 levels.    There is a normal lumbar lordosis. The vertebral body heights are  maintained. There are 2 mm grade 1 retrolistheses at the  L4-L5 and L5-S1  levels. There is multilevel degenerative disc disease with loss of disc  height most pronounced at L4-L5. The conus medullaris is unremarkable in  appearance and terminates at L1.    At L1-L2, there is mild disc bulging. There is no significant spinal canal  or  neural foraminal stenosis.    At L2-L3, there is bilateral facet hypertrophy. There is no significant  spinal canal or neural foraminal stenosis.    At L3-L4, there is diffuse disc bulging with a superimposed left foraminal  disc herniation which causes mild left neural foraminal stenosis and  contacts the exiting left L3 nerve root (best seen on series 8, images 12  and 13 and series 6, image 18). The right neural foramen is patent. There  is no significant spinal canal stenosis.    At L4-L5, there is dorsal osteophytic spurring, eccentric towards the left  which indents the ventral thecal sac without definite nerve root  impingement. There is no significant spinal canal stenosis. The neural  foramina are partially obscured by artifact from the surgical hardware and  not well assessed.    At L5-S1, there is disc bulging and endplate osteophytic spurring. There is  a residual small central disc herniation which does not appear to cause  nerve root impingement. There is enhancing scar which partially surrounds  the thecal sac and extends to the right lateral recess. No significant  spinal canal stenosis is seen. The neural foramina are partially obscured  by artifact from the surgical hardware and not well assessed.    IMPRESSION:     1. Postoperative and degenerative changes of the lumbar spine with evidence  of posterior decompression and instrumented fusion at L4-S1.  2. At L3-L4, there is a left foraminal disc herniation which causes left  neural foraminal stenosis and contacts the exiting left L3 nerve root.    Electronically signed by: Georgann Housekeeper M.D.  [Interpreted at: 40MRF]  [facility]    RC: 05/29/19        No results found.      Impression   53 y.o. female 1.5 years s/p L4-S1 laminectomy with lumbar fusion by me and is doing very well.  MRI and x-rays demonstrate excellent decompression solid fusion.  There is a finding of some right scar tissue with the neural foramen at L4-5 but I think that is insignificant and the neural foramen is widely patent.  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  She has a history of type 1 diabetes with insulin pump.  She also has a history of potential Shy-Drager syndrome for which she will be evaluated at [facility].  Given her persistent pain syndrome even despite the lumbar laminectomy fusion which has resolved the bulk of her radiculopathy and mechanical back pain but she just has generalized pain I do recommend a potential spinal cord stimulator trial.      The spinal cord stimulator will help her with her phantom pain in the right lower extremity and also help her with her autonomic pain that she has.  A trial is worth trying because it may be it if she ends up getting a permanent implant.  All of her questions were the patient was reassured.  No further spinal surgery is indicated or warranted at this time.      Plan     Orders Placed This Encounter   Procedures    Ambulatory referral to Pain Clinic     Referral Priority:   Routine     Referral Type:   Consultation     Referral Reason:   Specialty Services Required     Requested Specialty:   Pain Medicine     Number of  Visits Requested:   1       Follow up:   As needed      Many thanks for allowing me to participate in the care of this patient.      Delena Bali MD  Neurosurgery  [phone]

## 2019-07-20 ENCOUNTER — Encounter (INDEPENDENT_AMBULATORY_CARE_PROVIDER_SITE_OTHER): Payer: Self-pay | Admitting: Neurological Surgery

## 2019-07-22 ENCOUNTER — Encounter (INDEPENDENT_AMBULATORY_CARE_PROVIDER_SITE_OTHER): Payer: Self-pay | Admitting: Neurological Surgery

## 2019-07-30 DIAGNOSIS — G909 Disorder of the autonomic nervous system, unspecified: Secondary | ICD-10-CM | POA: Insufficient documentation

## 2019-10-04 NOTE — Progress Notes (Signed)
[facility] Neurosurgery  Follow Up Patient Note    Referring MD: Amie Critchley, MD   Primary Care MD: Willa Frater, [place]     MRN: [id number]      HPI   Chief Complaint:  Back pain, spinal cord stimulator     HPI    [name] [name] is a 53 y.o. female now almost 2 years s/p L4-S1 laminectomy and fusion on 12/19/2017 who presents to discuss spinal cord stimulator placement for chronic persistent back pain. She had a trial with Dr. Roger Shelter last month which helped her pain immensely, and she would like to proceed with placement. She has constant pain in her back and heaviness in her legs with difficulty walking. During the stimulator trial she was able to walk a mile without difficulty. The lead was placed in the T9-10 disc space, and there were no complications from the trial.    Impression   [name] [name] comes today for surgical discussion regarding placement of a spinal cord stimulator.  She has been diagnosed with RSD of the foot.  I discussed the risks, benefits, alternatives of this procedure.  I also spoke to her pain management physician.  The representative from the stimulator company was present in our discussion and I reviewed the x-ray to the paddle lead all questions addressed.    Plan   1. Proceed with spinal cord stimulator placement as scheduled on 10/23 using Nevro device   2. Pre-operative medical clearance      Again, many thanks for allowing me to participate in the care of this patient.  I will keep you apprised of their progress.  Please call me with any questions.    Sincerely,    Delena Bali MD    Neurosurgery  [phone]  (cell)  [phone]  (PA)   Alessandra Bevels, PA   ([email or web address])      Follow-up   No follow-ups on file.     Medical History     Past Medical History:   Diagnosis Date    Chronic back pain     Osteomyelitis of foot     R+ foot s/p 5th toe and lateral bone surgery        Surgical History     Past Surgical History:   Procedure Laterality Date     R+ foot 5th toe/metatarsal amputation  12/30/2016        Family History     Family History   Problem Relation Age of Onset    Dementia Mother     Diabetes Mother     Heart disease Father         Social History     Social History     Tobacco Use    Smoking status: Former Smoker     Years: 30.00     Quit date: 2018     Years since quitting: 2.7    Smokeless tobacco: Never Used   Substance Use Topics    Alcohol use: Yes     Alcohol/week: 1.0 standard drinks     Types: 1 Glasses of wine per week        Current Medications       Current Outpatient Medications:     acyclovir (ZOVIRAX) 800 MG tablet, Take 800 mg by mouth daily, Disp: , Rfl:     atorvastatin (LIPITOR) 20 MG tablet, atorvastatin 20 mg tablet  TK 1 T PO D, Disp: , Rfl:     b complex  vitamins tablet, Take 1 tablet by mouth daily., Disp: , Rfl:     Cholecalciferol (VITAMIN D3) 2000 UNIT capsule, Take 2,000 Units by mouth daily, Disp: , Rfl:     HUMALOG 100 UNIT/ML injection, Inject 100 mLs into the skin as needed, Disp: , Rfl:     Lancets (FREESTYLE) lancets, 1 each by Other route as needed, Disp: , Rfl:     levothyroxine (SYNTHROID, LEVOTHROID) 125 MCG tablet, Take 125 mcg by mouth daily, Disp: , Rfl:     Omega-3 Fatty Acids (FISH OIL) 1000 MG Cap capsule, Take 1 capsule by mouth 2 (two) times daily., Disp: , Rfl:     vitamin E 1000 UNIT capsule, Take 1,000 Units by mouth daily., Disp: , Rfl:     aspirin EC 81 MG EC tablet, Take 81 mg by mouth daily, Disp: , Rfl:     insulin aspart (NOVOLOG) 100 UNIT/ML injection, On insulin pump 18 - 25 units/day, Disp: 30 mL, Rfl: 3    raNITIdine (ZANTAC) 150 MG tablet, Take 150 mg by mouth 2 (two) times daily as needed, Disp: , Rfl:      Allergies   No Known Allergies     Review of Systems   Review of Systems   Constitutional: Negative.    Genitourinary: Negative.    Musculoskeletal: Positive for back pain and myalgias.   Neurological: Positive for weakness.     All other systems reviewed and are  negative.    Physical Examination   VITAL SIGNS:    Vitals:    10/07/19 1144   BP: 138/64         General:  Well developed, well nourished, no apparent distress  Neck:  Supple, no JVD, no apparent lymphadenopathy  HEENT:  Head normocephalic, atraumatic, no obvious lesions in ear, nose or throat  Chest:  Equal chest rise.  No wheezes, rales or rhonchi.  Skin:  No obvious lesions or scars  Extremities:  Without clubbing or cyanosis    Neurologic Exam    Midline lumbar incision well-healed    Awake, alert, oriented x3, Follows commands  GCS: 15   Speech is clear  Attention span and concentration: intact  Recent and remote memory: intact    Gait Intact    Point tenderness: None     Motor Exam:     R L   L2-3 Iliopsoas (Hip Flexion)  5 5  L3-4 Quadriceps (Knee Extension)  5 5  L5-S1 Hamstring (Knee Flexion)  5 5  L4-5 Tibialis Anterior (Foot Dorsiflexion) 5 5  S1 Gastrocsoleus (Plantar Flexion) 5 5  L5 EHL (Toe Dorsiflexion)  - 5    Sensation intact bilaterally     DTRs symmetric    Radiology Interpretation   No results found.       Jerilynn Mages, MD

## 2019-10-07 ENCOUNTER — Ambulatory Visit (INDEPENDENT_AMBULATORY_CARE_PROVIDER_SITE_OTHER): Payer: Commercial Managed Care - POS | Admitting: Neurological Surgery

## 2019-10-07 VITALS — BP 138/64 | Ht 68.0 in | Wt 164.0 lb

## 2019-10-07 DIAGNOSIS — G8929 Other chronic pain: Secondary | ICD-10-CM

## 2019-10-07 DIAGNOSIS — Z981 Arthrodesis status: Secondary | ICD-10-CM

## 2019-10-07 DIAGNOSIS — M5442 Lumbago with sciatica, left side: Secondary | ICD-10-CM

## 2019-10-11 ENCOUNTER — Encounter (INDEPENDENT_AMBULATORY_CARE_PROVIDER_SITE_OTHER): Payer: Self-pay | Admitting: Neurological Surgery

## 2019-10-13 ENCOUNTER — Telehealth: Payer: Self-pay

## 2019-10-13 NOTE — Pre-Procedure Instructions (Addendum)
Covid sched 10/19      [facility] 10/19    Remote hx MRSA in wound 2017/2018  email to Timmie Foerster to flag

## 2019-10-18 ENCOUNTER — Ambulatory Visit (INDEPENDENT_AMBULATORY_CARE_PROVIDER_SITE_OTHER): Payer: Commercial Managed Care - POS

## 2019-10-18 ENCOUNTER — Ambulatory Visit
Admission: RE | Admit: 2019-10-18 | Discharge: 2019-10-18 | Disposition: A | Discharge status: Home / Self Care | Payer: Commercial Managed Care - POS | Source: Ambulatory Visit | Attending: Nurse Practitioner | Admitting: Nurse Practitioner

## 2019-10-18 ENCOUNTER — Ambulatory Visit: Payer: Self-pay

## 2019-10-18 ENCOUNTER — Encounter: Payer: Self-pay | Admitting: Nurse Practitioner

## 2019-10-18 DIAGNOSIS — Z01818 Encounter for other preprocedural examination: Secondary | ICD-10-CM

## 2019-10-18 DIAGNOSIS — Z20828 Contact with and (suspected) exposure to other viral communicable diseases: Secondary | ICD-10-CM | POA: Insufficient documentation

## 2019-10-18 DIAGNOSIS — G8929 Other chronic pain: Secondary | ICD-10-CM

## 2019-10-18 DIAGNOSIS — Z01812 Encounter for preprocedural laboratory examination: Secondary | ICD-10-CM | POA: Insufficient documentation

## 2019-10-18 DIAGNOSIS — M545 Low back pain, unspecified: Secondary | ICD-10-CM

## 2019-10-18 DIAGNOSIS — I739 Peripheral vascular disease, unspecified: Secondary | ICD-10-CM | POA: Insufficient documentation

## 2019-10-18 DIAGNOSIS — Z0183 Encounter for blood typing: Secondary | ICD-10-CM | POA: Insufficient documentation

## 2019-10-18 LAB — URINALYSIS
Bilirubin, UA: NEGATIVE
Blood, UA: NEGATIVE
Glucose, UA: NEGATIVE
Ketones UA: NEGATIVE
Leukocyte Esterase, UA: NEGATIVE
Nitrite, UA: NEGATIVE
Protein, UR: NEGATIVE
Specific Gravity UA: 1.016 (ref 1.001–1.035)
Urine pH: 5 (ref 5.0–8.0)
Urobilinogen, UA: NEGATIVE mg/dL (ref 0.2–2.0)

## 2019-10-18 LAB — ECG 12-LEAD
Atrial Rate: 64 {beats}/min
P Axis: 58 degrees
P-R Interval: 182 ms
Q-T Interval: 418 ms
QRS Duration: 96 ms
QTC Calculation (Bezet): 431 ms
R Axis: 71 degrees
T Axis: 60 degrees
Ventricular Rate: 64 {beats}/min

## 2019-10-18 LAB — TYPE AND SCREEN
AB Screen Gel: NEGATIVE
ABO Rh: O NEG

## 2019-10-18 LAB — PT/INR
PT INR: 0.9 (ref 0.9–1.1)
PT: 12 s — ABNORMAL LOW (ref 12.6–15.0)

## 2019-10-18 LAB — APTT: PTT: 28 s (ref 23–37)

## 2019-10-18 NOTE — H&P (Signed)
PREOP HISTORY AND PHYSICAL EXAM    Patient Name: [facility]  Surgeon(s):  Jerilynn Mages, MD       Assessment:     53 yo female with history of autonomic neuropathy, DM, hyperlipidemia, hypothyroidism, GERD, gastroparesis, osteomyelitis, former smoker, marijuana use, now here with a longstanding history of lumbago presents for pre op medical evaluation prior to surgery.  NEVRO - SCS GENERATOR PLACEMENT surgery has been scheduled with Dr. Rayann Heman for 10/22/19 at [facility].      Patient has 0.9% RCRI in history and adequate functional capacity (METS 5), Stop-Bang score of 2 and is ASA class 3 with BMI less than 30.  Based on ACC/AHA 2014 guidelines, no additional CV testing is needed.  Patient is at acceptable risk to proceed with planned procedure.    Pre op evaluation:     No cardiac symptoms     Functional capacity is 5 Mets     Autonomic neuropathy:  Optimized on midodrine TID.     DM:  BS ranges from 135-175.  Optimized on insulin pump - robotic pancreas.       Hyperlipidemia:  Optimized on atorvastatin daily.     Hypothyroidism:  Optimized on synthroid daily.     GERD/ gastroparesis:  Optimized on prilosec daily.     Osteomyelitis:  S/p R 5th toe amputation 2018.     Former smoker:  Quit 2018.     Marijuana use:  Almost daily for pain management.     Exercise:  None.      Pt denies complications with anesthesia in the past.    Patient denies any exertional chest pain, shortness of breath, palpitations, dizziness, lightheadedness, syncope and claudication.  She has no cardiopulmonary symptoms.  She is afebrile.    Patients medical and surgical history, medication reconciliation and family history were reviewed as well as a complete physical exam was performed.  Recent laboratory results were obtained as well as an ECG recorded and read by me.  EKG is NSR with ventricular rate of 64.  CBC, CMP, HbA1C (7.6) completed 09/24/19 with satisfactory results.      Date Time: 10/18/19  9:55  AM  Plan:     Impression/Plan:  -Encouraged to stop smoking marijuana to avoid pulmonary complications and increased risk of infection  -I have sent the following lab testing, UA, PT/INR, T&S, MRSA/MSSA, COVID, and EKG testing as requested on patients preoperative evaluation form.  -Pre-op evaluation - ok to proceed with intermediate risk surgery, pending labs, and EKG results.  -Hold ASA, vitamins, supplements, NSAIDS, etc. 7-10 days prior to procedure.  -Discussed the importance of addressing and managing traditional modifiable cardiac risk factors (lipids, blood pressure, smoking, obesity, sedentary lifestyle) in order to reduce the risk of cardiovascular disease, CVD (heart attacks, heart failure, strokes, and peripheral arterial disease).  -Discussed with patient the importance of achieving and maintaining targeted glycemic control in order to reduce the risk of diabetes related complications such as eye disease, kidney disease, nerve damage, and amputation of the limbs as well as perioperative care management to reduce the risk of infection and long hospital stay  -Discussed the importance of patients active involvement in diabetes care (attending diabetes education classes and or individual education sessions, monitoring blood glucose levels before and after meals and keeping a log of self-monitored blood glucose, SMBG, taking prescribed medications on time, eating a healthy diabetic diet, and engaging in regular physical activity as tolerated).  -Hibiclens and instructions on proper use given to  pt.  -NPO after MN DOS.  -Take omeprazole, midodrine DOS.  -Keep red arm band on through DOS  -Avoid cuts/scrapes to body prior to surgery    History of Present Illness:     I had the pleasure of seeing Ms. Myung today for preoperative evaluation.  She is a 53 yo female with history of autonomic neuropathy, DM, hyperlipidemia, hypothyroidism, GERD, gastroparesis, osteomyelitis, former smoker, marijuana use, who is  scheduled for NEVRO - SCS GENERATOR PLACEMENT surgery on 10/22/19 at [facility] by Dr. Rayann Heman.  Her back is worsening and is affecting her quality of life.  She reports 3 back surgeries, last 11/2018 L5-S1 fusion.  She continues to have chronic low back aching, that radiates laterally, and down bilateral legs.  She describes leg heaviness with difficulty walking at times.  She reports no feeling to her posterior right thigh and constant burning to left leg.  She also has right ankle pain.  Today she has 2/10 pain, but it can increase to 6/10 on the pain scale. She indicated that non-surgical treatments failed to alleviate her pain and so unable to perform her usual activities of daily living and therefore will have it surgically repaired.    She denies any exertional discomfort to suggest angina.     Risk factors; no history of surgical complications, no easy bleeding tendency, no history of blood clots, no history of allergic reaction to anesthetic agents.    Current symptoms:  no unusual fatigue, normal appetite, no fever, no chest pain, no palpitations, no unexplained syncope, no shortness of breath, no reduction in exercise tolerance due to chest pain or dyspnea, no upper respiratory infection, no wheezing, no atypical bleeding, normal bowel function, no dysuria/urinary symptoms.    Procedures:     Procedure(s):  NEVRO - SCS GENERATOR PLACEMENT      Date of Procedure:   10/22/19    Past Medical History:     Past Medical History:   Diagnosis Date    Abnormal vision     Anemia     Autonomic neuropathy     Chronic back pain     Difficulty walking     uses walking assist devices if needed, crutches, boot, w/c prn    Disorder of musculoskeletal system     Gastroesophageal reflux disease     Gastroparesis     Hyperlipidemia     Hypothyroidism     Low back pain     sciatic pain    Osteomyelitis 2018    Peripheral vascular disease     Renal insufficiency     AKI while hospitalized for  osteomyelitis    Type 2 diabetes mellitus, controlled     insulin dependent       Past Surgical History:     Past Surgical History:   Procedure Laterality Date    CAROTID ENDARTERECTOMY Right 2019    COLONOSCOPY      R+ foot 5th toe/metatarsal amputation  06/2016    SPINE SURGERY  2011, 11/2014, 11/2018    x3       Family History:     Family History   Problem Relation Age of Onset    Dementia Mother     Diabetes Mother     Heart disease Father     Anesthesia problems Neg Hx     Malignant hyperthermia Neg Hx        Social History:     Social History     Socioeconomic History  Marital status: Married     Spouse name: Not on file    Number of children: Not on file    Years of education: Not on file    Highest education level: Not on file   Occupational History    Not on file   Social Needs    Financial resource strain: Not on file    Food insecurity     Worry: Not on file     Inability: Not on file    Transportation needs     Medical: Not on file     Non-medical: Not on file   Tobacco Use    Smoking status: Former Smoker     Years: 30.00     Quit date: 2018     Years since quitting: 2.8    Smokeless tobacco: Never Used   Substance and Sexual Activity    Alcohol use: Yes     Alcohol/week: 1.0 standard drinks     Types: 1 Glasses of wine per week     Comment: < once/month    Drug use: Yes     Types: Marijuana     Comment: daily       Sexual activity: Not on file   Lifestyle    Physical activity     Days per week: Not on file     Minutes per session: Not on file    Stress: Not on file   Relationships    Social connections     Talks on phone: Not on file     Gets together: Not on file     Attends religious service: Not on file     Active member of club or organization: Not on file     Attends meetings of clubs or organizations: Not on file     Relationship status: Not on file    Intimate partner violence     Fear of current or ex partner: Not on file     Emotionally abused: Not on file      Physically abused: Not on file     Forced sexual activity: Not on file   Other Topics Concern    Not on file   Social History Narrative    Not on file       Allergies:     Allergies   Allergen Reactions    Other      Actifed - hives and insomnia    Morphine Itching       Medications:   No current facility-administered medications for this encounter.     Current Outpatient Medications:     acyclovir (ZOVIRAX) 800 MG tablet, Take 800 mg by mouth every other day  , Disp: , Rfl:     atorvastatin (LIPITOR) 20 MG tablet, 20 mg nightly  , Disp: , Rfl:     b complex vitamins tablet, Take 1 tablet by mouth daily., Disp: , Rfl:     Cholecalciferol (VITAMIN D3) 2000 UNIT capsule, Take 2,000 Units by mouth daily, Disp: , Rfl:     doxepin (SINEquan) 10 MG capsule, Take 10 mg by mouth nightly Pt unsure of dosage, Disp: , Rfl:     HUMALOG 100 UNIT/ML injection, Inject 100 mLs into the skin as needed, Disp: , Rfl:     insulin aspart (NOVOLOG) 100 UNIT/ML injection, On insulin pump 18 - 25 units/day (Patient taking differently: On insulin pump 18 - 25 units/day (robotic pancreas)  6am-6pm -  6pm-6am - ), Disp: 30 mL,  Rfl: 3    levothyroxine (SYNTHROID, LEVOTHROID) 125 MCG tablet, Take 125 mcg by mouth nightly Except for Sat- takes 6 days/week , Disp: , Rfl:     melatonin 3 mg tablet, Take 10 mg by mouth nightly, Disp: , Rfl:     midodrine (PROAMATINE) 2.5 MG tablet, Take 5 mg by mouth 3 (three) times daily, Disp: , Rfl:     naproxen sodium (ANAPROX) 220 MG tablet, Take 220 mg by mouth daily as needed, Disp: , Rfl:     Omega-3 Fatty Acids (FISH OIL) 1000 MG Cap capsule, Take 1 capsule by mouth 2 (two) times daily., Disp: , Rfl:     omeprazole (PriLOSEC) 40 MG capsule, Take 40 mg by mouth daily as needed, Disp: , Rfl:     vitamin E 1000 UNIT capsule, Take 1,000 Units by mouth daily., Disp: , Rfl:     aspirin EC 81 MG EC tablet, Take 81 mg by mouth daily, Disp: , Rfl:     Lancets (FREESTYLE) lancets, 1 each by Other  route as needed, Disp: , Rfl:     Review of Systems:     Review of Systems   Constitutional: Negative.    HENT: Negative.    Eyes: Negative.    Respiratory: Negative.    Cardiovascular: Negative.    Gastrointestinal: Positive for heartburn and nausea.        Nausea occasional, more stress induced as related to gastroparesis   Genitourinary: Negative.    Musculoskeletal: Positive for back pain. Negative for falls.   Skin: Negative.    Neurological: Positive for tingling.        Sciatic nerve pain BLE   Endo/Heme/Allergies: Negative.    Psychiatric/Behavioral: Negative.         Physical Exam:   Ht 1.727 m (5\' 8" )    Wt 74.4 kg (164 lb)    BMI 24.94 kg/m   Vitals:    10/18/19 0845   BP: 148/86   Pulse: 67   Resp: 16   Temp: 97 F (36.1 C)   SpO2: 99%       Physical Exam  Constitutional:       Appearance: Normal appearance. She is normal weight.      Comments: Ambulating without assistive device today   HENT:      Head: Normocephalic and atraumatic.      Right Ear: External ear normal.      Left Ear: External ear normal.      Nose: Nose normal.      Mouth/Throat:      Mouth: Mucous membranes are moist.      Pharynx: Oropharynx is clear.   Eyes:      Conjunctiva/sclera: Conjunctivae normal.   Neck:      Musculoskeletal: Normal range of motion and neck supple.   Cardiovascular:      Rate and Rhythm: Normal rate and regular rhythm.      Pulses: Normal pulses.      Heart sounds: Normal heart sounds.   Pulmonary:      Effort: Pulmonary effort is normal.      Breath sounds: Normal breath sounds.   Abdominal:      General: Bowel sounds are normal.      Palpations: Abdomen is soft.   Genitourinary:     Comments: deferred  Musculoskeletal: Normal range of motion.      Right lower leg: No edema.      Left lower leg: No edema.   Skin:  General: Skin is warm and dry.      Capillary Refill: Capillary refill takes less than 2 seconds.   Neurological:      General: No focal deficit present.      Mental Status: She is alert and  oriented to person, place, and time.      Gait: Gait normal.   Psychiatric:         Mood and Affect: Mood normal.         Behavior: Behavior normal.         Katelynne Revak P Nickalas Mccarrick  10/18/2019

## 2019-10-19 LAB — PRESURGICAL SURVEILLANCE, MSSA+MRSA: Culture Staph and MRSA Surveillance: POSITIVE — AB

## 2019-10-19 LAB — COVID-19 (SARS-COV-2): SARS CoV 2 Overall Result: NOT DETECTED

## 2019-10-20 ENCOUNTER — Ambulatory Visit
Admission: RE | Admit: 2019-10-20 | Discharge: 2019-10-20 | Disposition: A | Discharge status: Home / Self Care | Payer: Commercial Managed Care - POS | Source: Ambulatory Visit | Attending: Nurse Practitioner | Admitting: Nurse Practitioner

## 2019-10-20 ENCOUNTER — Encounter (INDEPENDENT_AMBULATORY_CARE_PROVIDER_SITE_OTHER): Payer: Self-pay | Admitting: Neurological Surgery

## 2019-10-20 ENCOUNTER — Encounter: Payer: Self-pay | Admitting: Nurse Practitioner

## 2019-10-20 DIAGNOSIS — M545 Low back pain, unspecified: Secondary | ICD-10-CM

## 2019-10-20 DIAGNOSIS — G8929 Other chronic pain: Secondary | ICD-10-CM | POA: Insufficient documentation

## 2019-10-20 NOTE — Progress Notes (Addendum)
Pt +MSSA nares.  LM for patient to return to my call.  Will also attempt to get repeat MRSA testing to attempt to get pt off isolation due to MRSA history.    Addendum - pt returned my call and will return for repeat MRSA testing 10/20/19.  Pt will start mupirocin ointment after swab completed.  All questions answered.

## 2019-10-21 LAB — PRESURGICAL SURVEILLANCE, MSSA+MRSA
Culture Staph and MRSA Surveillance: POSITIVE — AB
Culture Staph and MRSA Surveillance: POSITIVE — AB

## 2019-10-22 ENCOUNTER — Ambulatory Visit
Admission: RE | Admit: 2019-10-22 | Payer: Commercial Managed Care - POS | Source: Ambulatory Visit | Admitting: Neurological Surgery

## 2019-10-22 ENCOUNTER — Encounter: Admission: RE | Payer: Self-pay | Source: Ambulatory Visit

## 2019-10-22 HISTORY — DX: Hypothyroidism, unspecified: E03.9

## 2019-10-22 HISTORY — DX: Difficulty in walking, not elsewhere classified: R26.2

## 2019-10-22 HISTORY — DX: Anemia, unspecified: D64.9

## 2019-10-22 HISTORY — DX: Gastro-esophageal reflux disease without esophagitis: K21.9

## 2019-10-22 HISTORY — DX: Disorder of the autonomic nervous system, unspecified: G90.9

## 2019-10-22 HISTORY — DX: Type 2 diabetes mellitus without complications: E11.9

## 2019-10-22 HISTORY — DX: Peripheral vascular disease, unspecified: I73.9

## 2019-10-22 HISTORY — DX: Gastroparesis: K31.84

## 2019-10-22 HISTORY — DX: Unspecified visual disturbance: H53.9

## 2019-10-22 HISTORY — DX: Hyperlipidemia, unspecified: E78.5

## 2019-10-22 HISTORY — DX: Soft tissue disorder, unspecified: M79.9

## 2019-10-22 HISTORY — DX: Disorder of kidney and ureter, unspecified: N28.9

## 2019-10-22 HISTORY — DX: Low back pain, unspecified: M54.50

## 2019-10-22 SURGERY — INSERTION, SPINAL CORD STIMULATOR GENERATOR
Anesthesia: General

## 2019-11-08 ENCOUNTER — Encounter (INDEPENDENT_AMBULATORY_CARE_PROVIDER_SITE_OTHER): Payer: Self-pay | Admitting: Neurological Surgery

## 2020-06-26 ENCOUNTER — Encounter (INDEPENDENT_AMBULATORY_CARE_PROVIDER_SITE_OTHER): Payer: Self-pay

## 2020-06-26 DIAGNOSIS — I951 Orthostatic hypotension: Secondary | ICD-10-CM

## 2020-06-26 DIAGNOSIS — Z01810 Encounter for preprocedural cardiovascular examination: Secondary | ICD-10-CM | POA: Insufficient documentation

## 2020-06-26 DIAGNOSIS — Z8249 Family history of ischemic heart disease and other diseases of the circulatory system: Secondary | ICD-10-CM | POA: Insufficient documentation

## 2020-06-26 DIAGNOSIS — I739 Peripheral vascular disease, unspecified: Secondary | ICD-10-CM

## 2020-06-26 DIAGNOSIS — F17210 Nicotine dependence, cigarettes, uncomplicated: Secondary | ICD-10-CM | POA: Insufficient documentation

## 2020-06-26 DIAGNOSIS — M4807 Spinal stenosis, lumbosacral region: Secondary | ICD-10-CM

## 2020-07-11 ENCOUNTER — Telehealth (INDEPENDENT_AMBULATORY_CARE_PROVIDER_SITE_OTHER): Payer: Commercial Managed Care - POS | Admitting: Cardiovascular Disease

## 2020-07-11 ENCOUNTER — Telehealth (INDEPENDENT_AMBULATORY_CARE_PROVIDER_SITE_OTHER): Payer: Self-pay

## 2020-07-11 NOTE — Telephone Encounter (Signed)
Attempted to call patient on phone number [phone] regarding her telehealth video visit with Dr. Dimas Aguas today 7/13 at 8:15am.     I called patient 3 different times 7:55am, 8:00am and 8:11am. Calls rang out into VM. I was able to leave a msg to have patient give our office a call. At this point patient will need to reschedule her appt.

## 2020-11-14 ENCOUNTER — Ambulatory Visit (INDEPENDENT_AMBULATORY_CARE_PROVIDER_SITE_OTHER): Payer: Commercial Managed Care - POS | Admitting: Cardiovascular Disease

## 2021-02-09 ENCOUNTER — Encounter (INDEPENDENT_AMBULATORY_CARE_PROVIDER_SITE_OTHER): Payer: Self-pay | Admitting: Cardiovascular Disease

## 2021-02-09 ENCOUNTER — Ambulatory Visit (INDEPENDENT_AMBULATORY_CARE_PROVIDER_SITE_OTHER): Payer: Medicare Other | Admitting: Cardiovascular Disease

## 2021-02-09 VITALS — BP 126/74 | HR 74 | Ht 68.0 in | Wt 168.0 lb

## 2021-02-09 DIAGNOSIS — I6523 Occlusion and stenosis of bilateral carotid arteries: Secondary | ICD-10-CM

## 2021-02-09 DIAGNOSIS — I739 Peripheral vascular disease, unspecified: Secondary | ICD-10-CM

## 2021-02-09 DIAGNOSIS — R0600 Dyspnea, unspecified: Secondary | ICD-10-CM

## 2021-02-09 DIAGNOSIS — E782 Mixed hyperlipidemia: Secondary | ICD-10-CM

## 2021-02-09 DIAGNOSIS — E109 Type 1 diabetes mellitus without complications: Secondary | ICD-10-CM

## 2021-02-09 DIAGNOSIS — R0609 Other forms of dyspnea: Secondary | ICD-10-CM

## 2021-02-09 NOTE — Progress Notes (Signed)
[facility] PROGRESS NOTE    [facility] OFFICE -CARDIOLOGY  [address]  [place] [address]  Dept: [phone]  Dept Fax: [phone]       Patient Name: [name] [name], [name] [name]    Date of Visit:  February 09, 2021  Date of Birth: 24-Nov-1966  AGE: 55 y.o.  Medical Record #: [id number]  Requesting Physician: Willa Frater, PA      CHIEF COMPLAINT: Follow-up      HISTORY OF PRESENT ILLNESS:    She is a pleasant 55 y.o. female who presents today for follow-up. We last saw her via telehealth at the start of the COVID pandemic, and are following her for a history of long-term poorly controlled type 1 diabetes mellitus as well as orthostatic hypotension, peripheral vascular disease with microvascular ischemia of the feet and concern for previous carotid disease. She used to smoke but quit in the not too distant past. She continues to take midodrine for postural orthostasis. She has lot of concerns about her vascular status. The present time she is moderately active and does not get shortness of breath or chest pain, but has a type I diabetic is certainly at risk of developing silent ischemia. She is also concerned for recent onset headaches and a past history of carotid disease without recent follow-up. 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      PAST MEDICAL HISTORY: She has a past medical history of Abnormal vision, Anemia, Autonomic neuropathy, Chicken pox, Chronic back pain, Difficulty walking, Disorder of musculoskeletal system, Echocardiogram (11/2017), Gastroesophageal reflux disease, Gastroparesis, Hyperlipidemia, Hypothyroidism, Low back pain, Measles, MPI (Nuclear) Study (2018), MRSA (methicillin resistant staph aureus) culture positive (2017), Mumps, Osteomyelitis (2018), Peripheral vascular disease, Renal insufficiency, and  Type 2 diabetes mellitus, controlled. She has a past surgical history that includes R+ foot 5th toe/metatarsal amputation (06/2016); Carotid endarterectomy (Right, 2019); Spine surgery (2011, 11/2014, 11/2018); and Colonoscopy.    ALLERGIES:   Allergies   Allergen Reactions    Other      Actifed - hives and insomnia    Morphine Itching       MEDICATIONS:   Current Outpatient Medications   Medication Sig    atorvastatin (LIPITOR) 20 MG tablet 20 mg nightly       b complex vitamins tablet Take 1 tablet by mouth daily.    Cholecalciferol (VITAMIN D3) 2000 UNIT capsule Take 2,000 Units by mouth daily    HUMALOG 100 UNIT/ML injection Inject 100 mLs into the skin as needed    Lancets (FREESTYLE) lancets 1 each by Other route as needed    levothyroxine (SYNTHROID, LEVOTHROID) 125 MCG tablet Take 100 mcg by mouth nightly       midodrine (PROAMATINE) 5 MG tablet Take 10 mg by mouth once as needed    Omega-3 Fatty Acids (FISH OIL) 1000 MG Cap capsule Take 1 capsule by mouth 2 (two) times daily.    omeprazole (PriLOSEC) 40 MG capsule Take 40 mg by mouth daily    vitamin E 1000 UNIT capsule Take 1,000 Units by mouth daily.        FAMILY HISTORY: family history includes Aortic aneurysm in her father; Dementia in her mother; Diabetes in her mother and paternal grandfather; Heart disease in her father and maternal grandparent; Myocardial Infarction in her paternal grandmother.  SOCIAL HISTORY: She reports that she quit smoking about 4 years ago. She quit after 30.00 years of use. She has never used smokeless tobacco. She reports current alcohol use of about 1.0 standard drink of alcohol per week. She reports current drug use. Drug: Marijuana.    PHYSICAL EXAMINATION    Visit Vitals  BP 126/74 (BP Site: Left arm, Patient Position: Sitting, Cuff Size: Medium)   Pulse 74   Ht 1.727 m (5\' 8" )   Wt 76.2 kg (168 lb)   BMI 25.54 kg/m       General Appearance:  A well-appearing female in no acute distress.    Skin: Warm and  dry to touch, no apparent skin lesions, or masses noted.  Head: Normocephalic, normal hair pattern, no masses or tenderness   Eyes: EOMS Intact, PERRL, conjunctivae and lids unremarkable.  ENT: Ears, Nose and throat reveal no gross abnormalities.  No pallor or cyanosis.  Dentition good.   Neck: JVP normal, no carotid bruit, thyroid not enlarged   Chest: Clear to auscultation bilaterally with good air movement and respiratory effort and no wheezes, rales, or rhonchi   Cardiovascular: Regular rhythm, S1 normal, S2 normal, No S3 or S4. Apical impulse not displaced. No murmur. No gallops or rubs detected   Abdomen: Soft, nontender, nondistended, with normoactive bowel sounds. No organomegaly.  No pulsatile masses, or bruits.   Extremities: Warm without edema. No clubbing, or cyanosis. All peripheral pulses are full and equal.   Neuro: Alert and oriented x3. No gross motor or sensory deficits noted, affect appropriate.        ECG: Normal sinus rhythm      LABS:   No results found for: WBC, HGB, HCT, PLT  Lab Results   Component Value Date    GLU 186 (H) 12/03/2017    BUN 15 12/03/2017    CREAT 1.05 (H) 12/03/2017    NA 136 12/03/2017    K 4.7 12/03/2017    CL 102 12/03/2017    CO2 24 12/03/2017     Lab Results   Component Value Date    HGBA1C 7.9 (A) 03/17/2018     No results found for: CHOL, TRIG, HDL, LDL         Most recent echo and nuclear study reviewed.      IMPRESSION:   [name] is a 55 y.o. female with the following problems:    1. Carotid artery disease  2. Lower extremity microvascular disease  3. Hyperlipidemia  4. Type 1 diabetes mellitus  5. Multiple coronary risk factors      RECOMMENDATIONS:    1. We discussed heart healthy diet exercise and lifestyle, as well as control of risk factors. She is on good preventive medical therapy. She is not currently taking aspirin and we will revisit this with the results of the above testing.  2. We will check carotid disease given her history to see the presence of  severe recurrent carotid disease, and determine the need for aspirin based on the study. We will also look at the vertebral flow to make sure there is no evidence of subclavian stenosis.  3. We will check a lead given the nonhealing ulcer to make sure she has not developed significant macrovascular disease. Microvascular disease is a given given her uncontrolled diabetes mellitus, and she is on good preventive medical fat therapy for this as far as is possible and obviously should exercise good blood sugar control.  4. We will check an exercise  treadmill test to make sure that she does not have significant burden of ischemia or high risk findings that would mandate a further evaluation.  5. Continue heart healthy diet exercise and lifestyle. I will see her back here in a year or sooner if the tests are significantly abnormal. Thank you for including me the care of his very nice lady.                                                     Orders Placed This Encounter   Procedures    ECG 12 lead (Normal)       No orders of the defined types were placed in this encounter.      SIGNED:    Tawni Pummel, MD          This note was generated by the Dragon speech recognition and may contain errors or omissions not intended by the user. Grammatical errors, random word insertions, deletions, pronoun errors, and incomplete sentences are occasional consequences of this technology due to software limitations. Not all errors are caught or corrected. If there are questions or concerns about the content of this note or information contained within the body of this dictation, they should be addressed directly with the author for clarification.

## 2021-04-04 NOTE — H&P (Signed)
[facility] OFFICE  [address] [place], [place] [address]     [name]    Date of Visit:  12/15/2017  Date of Birth: 1966-04-26  Age: 55 yrs.   Medical Record Number: [id number]  Referring Physician: Renaldo Reel MD, CAROLINE  __   CURRENT DIAGNOSES     1. DM Type I uncomplicated, unspecified, E10.9  2. Hyperlipidemia, mixed, E78.2  3. Nicotine dependence, cigarettes, uncomplicated, F17.210  4. PVD, bilateral legs  unspecified, I70.203  5. Spinal Stenosis, Lumbosacral Region, M48.07  6. Pre-op cardiovascular examination, Z01.810  7. Family history of ischemic heart disease and other diseases of the circulatory system, Z82.49  __   ALLERGIES    Actifed, Hives and/or rash  __  MEDICATIONS      1. acyclovir 400 mg tablet, 500 mg qod  2. atorvastatin 10 mg tablet, 1 po qd, unknown dosage  3. B Complex 1 tablet, 1 po qd  4. Calcium 600 + D(3) 600 mg calcium- 200 unit capsule, 1 po qd  5. Fish Oil 1,000 mg (120 mg-180  mg) capsule, 1 po qd  6. Humalog Mix 50-50 (U-100) Insulin 100 unit/mL subcutaneous suspension, use as directed, dosage unknown  7. levothyroxine 137 mcg tablet, 1 po qd  8. lisinopril 10 mg tablet, 1 po qd, dosage unknown  9. vitamin  E 1,000 unit capsule, 1 po qd  __  CHIEF COMPLAINT/REASON FOR VISIT  Pre-operative cardiovascular risk evaluation  __   HISTORY OF PRESENT ILLNESS  As you know, Ms. Vertz is a 55 year old female with history of type 1 diabetes mellitus for the past 30 years who is scheduled for spine surgery in the coming few days. She  was referred to me by her endocrinologist due to concerns about her cardiovascular status. She has been unable to exert herself in any description over the last few months. Prior to this, she had what sounds like a Charcot foot with some microvascular  dysfunction, has had multiple foot surgeries and really has not been very active for quite a while now. She certainly cannot attain 4 METs of exercise due to her functional limitations. She has  multiple risk factors. Her type 1 diabetes has had episodes  where it has been very poorly controlled. She also continues to smoke occasionally. Her father had a myocardial infarction at the age of 66 with a substantial ischemic cardiomyopathy status post ICD, and all members of the family on his side had premature  coronary disease. She has been taking atorvastatin for preventive means. She denies any exertional shortness of breath, but really is not able to do much. She denies palpitations. Her wounds following her foot surgeries were extremely slow to resolve  and she, as far as I can tell, has not had any type of lower extremity vascular evaluation.  __  PAST HISTORY      Past Medical Illnesses: Hypothyroidism, Diabetes mellitus-insulin dependent, Hyperlipidemia;  Past Cardiac  Illnesses: No previous history of cardiac disease.; Infectious Diseases: Usual childhood illnesses of mumps,  measles and chickenpox; Surgical Procedures: Foot surgery x8 2015-2017, Back surgery 2011 2015; Trauma  History: No previous history of significant trauma.; Cardiology Procedures-Invasive: No previous interventional  or invasive cardiology procedures.; Cardiology Procedures-Noninvasive: No previous non-invasive cardiovascular testing.   ___  FAMILY HISTORY  Father -- Aortic aneurysm (unspecified  aneurysm)  MaternalGrandparent -- Heart disease (unspecified heart issues)  Mother --  Diabetes mellitus type 2  PaternalGrandparent -- Myocardial infarction  PaternalGrandparent --  Diabetes  mellitus    __  CARDIAC RISK FACTORS     Tobacco Abuse : positive; Family History of Heart Disease: positive; Hyperlipidemia : positive; Hypertension: negative;  Diabetes Mellitus : positive; Prior History of Heart Disease: negative; Obesity : negative; Sedentary Life Style:positive; ZOX:WRUEAVWU;  Menopausal:biological menopause  __  SOCIAL HISTORY     Alcohol Use: Does not use alcohol; Smoking: 20 year history smoking, reports she recently quit  11/2017;  Current some day smoker ([id number]); Diet: Regular diet and Caffeine use-5 or more per day; Exercise : No regular exercise;   __  REVIEW OF SYSTEMS    General : decreased exercise tolerance, fatigue, weight loss, loss of appetite; Integumentary:  Denies any change in hair or nails, rashes, or skin lesions.; Eyes: Denies diplopia, glaucoma or visual field defects.;  Ears, Nose, Throat, Mouth: Denies any hearing loss, epistaxis, hoarseness or difficulty speaking.;Respiratory : Denies dyspnea, cough, wheezing or hemoptysis.; Cardiovascular:  Please review HPI, Painful cramping or sharp pains in the hips, thighs, or calves when walking, climbing stairs, or exercising, Foot or toe wounds that will not heal or heal very slowly, decreased walking endurance;  Abdominal : Denies ulcer disease, hematochezia or melena.;Musculoskeletal :loss of strength; Neurological : Denies any recurrent  strokes, TIA, or seizure disorder.; Psychiatric: Denies any depression, substance abuse or change in  cognitive functions.; Endocrine: Denies any weight change, heat/cold intolerance, polydipsia, or polyuria;  Hematologic/Immunologic: Denies any food allergies, seasonal allergies, bleeding disorders.  __   PHYSICAL EXAMINATION    Vital Signs:  Blood Pressure:   90/60 Sitting, Right arm, regular cuff      BMI: 24   Pulse:  70/min. EKG       Constitutional: Cooperative, alert and oriented,well developed, well nourished,  in no acute distress. Skin: Warm and dry to touch, no apparent skin lesions, or masses noted. Head : Normocephalic, normal hair pattern, no masses or tenderness Eyes: EOMS Intact, PERRL, conjunctivae and  lids normal. Funduscopic exam and visual fields not performed. ENT: Ears, Nose and throat reveal no gross  abnormalities. No pallor or cyanosis. Dentition good. Neck: No palpable masses or adenopathy, no thyromegaly,  no JVD, carotid pulses are full and equal bilaterally without bruits. Chest: Normal symmetry,  no tenderness to palpation, normal respiratory excursion,  no intercostal retraction, no use of accessory muscles, normal diaphragmatic excursion, clear to auscultation and percussion. Cardiac: Regular rhythm,  S1 normal, S2 normal, No S3 or S4, Apical impulse not displaced, no murmurs, gallops or rubs detected. Abdomen: Abdomen soft, bowel sounds normoactive,  no masses, no hepatosplenomegaly, non-tender, no bruits Peripheral Pulses: Diminished but present pedal  pulses. Strong popliteals bilaterally. Multiple right foot scars. Extremities/Back: No deformities, clubbing, cyanosis, erythema or edema observed. There  are no spinal abnormalities noted. Normal muscle strength and tone. Neurological: No gross motor or sensory  deficits noted, affect appropriate, oriented to time, person and place.   __    Medications added today by the physician:     ASSESSMENT:   1. Preoperative risk evaluation.  2. Type 1 diabetes mellitus with complications.  3. Tobacco use.  4. Strong family history of coronary artery disease and cardiomyopathy.   5. Likely lower extremity vascular disease with history of nonhealing ulcers and Charcot joint.    PLAN:   1. By the 2014 AHA/ACC guidelines, she  is scheduled for intermediate risk surgery and is unable   to attain 4 METs of exercise. With her multiple risk factors, ischemic evaluation is required  before she proceeds to the OR. We will set her up for an ASAP Lexiscan nuclear test. If  the   test is normal or low risk, she may proceed to the OR at acceptable cardiac risk. We will also   check an echo since she has never had a cardiac evaluation given her family history of   cardiomyopathy to exclude a familial form.   2. She should stop smoking immediately.  3. Blood sugar control is under the care of her endocrinologist, and I have explained the   importance of this for cardiovascular health.  4. We will plan to see her back here in two months. She will  need significant ongoing    maintenance including ABIs and/or lower extremity vascular evaluation.  5. Thank you for including me in the care of this very pleasant lady.    Tawni Pummel, MD, PhD, [facility] [place] [facility], [facility]PV[facility]     Tid: [id number]     cc: Shanda Howells MD  JEFFREY Magda Bernheim MD    shj  ____________________________  Christianne Dolin  2D, color flow, doppler At Patient [facility]  Tobacco_cessation_counseling_provided TODAY  Lexiscan Infusion First Available  Return Visit 15 MIN 2 months

## 2021-04-04 NOTE — Progress Notes (Signed)
[facility] OFFICE   [address] [place], [place] [address]     TELEMEDICINE VISIT       YESSENIA, MAILLET    Date of Visit:  05/18/2019  Date of Birth: 10-22-1966  Age: 55 yrs.   Medical Record Number: [id number]  __  CURRENT DIAGNOSES     1. DM Type I uncomplicated, unspecified,  E10.9  2. Hyperlipidemia, mixed, E78.2  3. Nicotine dependence, cigarettes, uncomplicated, F17.210  4. PVD, bilateral legs unspecified, I70.203  5. Orthostatic Hypotension, I95.1  6. Spinal Stenosis, Lumbosacral Region, M48.07   7. Pre-op cardiovascular examination, Z01.810  8. Family history of ischemic heart disease and other diseases of the circulatory system, Z82.49  __  ALLERGIES     No Known Drug Allergies  __  MEDICATIONS     1. levothyroxine 137 mcg tablet, 1 po qd  2.  acyclovir 400 mg tablet, 500 mg qod  3. Humalog Mix 50-50 (U-100) Insulin 100 unit/mL subcutaneous suspension, use as directed, dosage unknown  4. vitamin E 1,000 unit capsule, 1 po qd  5. Calcium 600 + D(3) 600 mg calcium- 200 unit capsule,  1 po qd  6. B Complex 1 tablet, 1 po qd  7. atorvastatin 20 mg tablet, 1 po qd  8. aspirin 81 mg chewable tablet, 1 po q am  9. midodrine 5 mg tablet, take 1 tab po tid last dose 4 hours before bedtime  __   CHIEF COMPLAINT/REASON FOR VISIT  Followup of Family history of ischemic heart disease and other diseases of the circulatory system, Followup of Hyperlipidemia, mixed, Followup of Orthostatic Hypotension,  Followup of PVD and bilateral legs unspecified  __  HISTORY OF PRESENT ILLNESS  The visit today was conducted via telemedicine due to COVID-19  precautions. The patient was in their home and was informed and gave verbal consent to proceed.Today's visit is conducted using Doximity. As you know this is a very nice 55 year old with history of type 1 diabetes, orthostatic hypotension, peripheral  vascular disease with microvascular ischemia of the feet hyperlipidemia and history of tobacco use. Since we last saw  her she developed a lot of orthostatic symptoms is actually seen [facility] autonomic instability specialists. They increase  the dose of Midrin. She has been trying to increase her hydration although has met with mixed success in this. She is also taken up smoking again from time to time. She is at home on disability due to spine stenosis and functional limitations. She has  not been exercising much due to coronavirus. She is gained a little bit of weight although she is not eating as much as usual. She is orthostatic today. She has had trouble with the midday dose of Midrin and this time of day tends to be worse than others  for the symptoms. She has not had any true syncope. There is no chest pain or shortness of breath.        __  PAST HISTORY      Past Medical Illnesses: Hypothyroidism, Diabetes mellitus-insulin dependent, Hyperlipidemia;  Past Cardiac  Illnesses: No previous history of cardiac disease.; Infectious Diseases: Usual childhood illnesses of mumps,  measles and chickenpox; Surgical Procedures: Foot surgery x8 2015-2017, Back surgery 2011 2015; Trauma  History: No previous history of significant trauma.; Cardiology Procedures-Invasive: No previous interventional  or invasive cardiology procedures.; Cardiology Procedures-Noninvasive: Echocardiogram December 2018, MPI (Nuclear) Study 2018;  Left Ventricular Ejection Fraction: LVEF of 60% documented via echocardiogram on 12/16/2017  ___  FAMILY HISTORY  Father -- Aortic aneurysm (unspecified aneurysm)  MaternalGrandparent --  Heart disease (unspecified heart issues)  Mother -- Diabetes mellitus type 2  PaternalGrandparent  -- Myocardial infarction  PaternalGrandparent -- Diabetes mellitus    __  CARDIAC RISK  FACTORS     Tobacco Abuse: positive; Family History of Heart Disease : positive; Hyperlipidemia: positive; Hypertension : negative;  Diabetes Mellitus: positive; Prior History of Heart Disease : negative; Obesity: BMI25 (Over weight);  Sedentary Life Style :positive; ZOX:WRUEAVWU; Menopausal:biological  menopause  __  SOCIAL HISTORY    Alcohol  Use: Does not use alcohol; Smoking: 20 year history smoking, reports she recently quit 11/2017; Current  some day smoker ([id number]); Diet: Regular diet and Caffeine use-5 or more per day; Exercise : No regular exercise;   __  PHYSICAL EXAMINATION    Vital Signs:  Blood Pressure:   78/50 Right Arm Home Monitor Sitting  108/67 Left Arm Home Monitor Sitting    Weight: 165.00 lbs.   Height: 68.00"  BMI: 25.09   Pulse:  84/min. home monitor       Constitutional: Cooperative, alert and oriented,well developed, well nourished, in no acute distress.    __    Medications added today by the physician:        Assessment  1. Autonomic instability with postural orthostasis  2. Continued tobacco use  3. Type 1 diabetes mellitus with neuropathy  4. Presumed lower extremity  vascular disease with history of Charcot foot  5. Hyperlipidemia    Plan  1. We talked about smoking and how important it is that she stop. She is going to try harder on this.  2. We talked about the importance of hydration, and continued  sodium intake. She will also take the Midrin as directed. It sounds as she is doing better on this. I have asked for her just arrange for the records from [facility] to be sent to [place] to ensure we are not duplicating efforts.  3. Diabetic control was  reviewed.  4. She recently had an injury to her foot which healed up quickly. There is no indication for further vascular testing at this point.  5. Heart healthy diet exercise and lifestyle was reviewed. Thank you for including in the care of  this very nice lady. I will next year in 6 months in the office or sooner if new symptoms arise.    Signed,  Tawni Pummel, MD, [facility]      cc: Shanda Howells MD    ____________________________  TODAYS ORDERS  Diet_mgmt_edu,_guidance_and_counseling  TODAY  Return Visit 15 MIN 6 months

## 2021-04-04 NOTE — Progress Notes (Signed)
[facility] OFFICE  [address] [place], [place] [address]     [name] [name]    Date of Visit:  08/21/2018  Date of Birth: 26-Aug-1966  Age: 55 yrs.   Medical Record Number: [id number]  __  CURRENT DIAGNOSES     1. DM Type I uncomplicated, unspecified,  E10.9  2. Hyperlipidemia, mixed, E78.2  3. Nicotine dependence, cigarettes, uncomplicated, F17.210  4. PVD, bilateral legs unspecified, I70.203  5. Spinal Stenosis, Lumbosacral Region, M48.07  6. Pre-op cardiovascular examination,  Z01.810  7. Family history of ischemic heart disease and other diseases of the circulatory system, Z82.49  __  ALLERGIES     Actifed, Hives and/or rash  __  MEDICATIONS     1. acyclovir 400 mg tablet, 500 mg qod   2. atorvastatin 10 mg tablet, 1 po qd, unknown dosage  3. B Complex 1 tablet, 1 po qd  4. Calcium 600 + D(3) 600 mg calcium- 200 unit capsule, 1 po qd  5. Fish Oil 1,000 mg (120 mg-180 mg) capsule, 1 po qd  6. Humalog Mix 50-50 (U-100)  Insulin 100 unit/mL subcutaneous suspension, use as directed, dosage unknown  7. levothyroxine 137 mcg tablet, 1 po qd  8. vitamin E 1,000 unit capsule, 1 po qd  __  CHIEF COMPLAINT/REASON  FOR VISIT  pre-syncope  __  HISTORY OF PRESENT ILLNESS  [name] [name] with history of type 1 diabetes,  peripheral vascular disease, hypertension, hyperlipidemia comes in today with complaints of intermittent presyncope. The patient states that she gets very lightheaded, especially with positional change going from sitting to standing or lying flat to standing.  The patient states that this is chronic and has been going on for months. The patient has recently seen Dr. Renaldo Reel, her endocrinologist who has taken her off the lisinopril one month ago. The patient states that since coming off the medication, her symptoms  have significantly improved. The patient denies any associated chest pain, shortness of breath and palpitations. On exam, the patient's blood pressure dropped from 120/72 to standing  left arm 80/50 and right arm 98/60. The patient's heart rate remained  the same at 80 beats per minute.   __  PAST HISTORY      Past Medical Illnesses: Hypothyroidism, Diabetes mellitus-insulin dependent, Hyperlipidemia;  Past Cardiac  Illnesses: No previous history of cardiac disease.; Infectious Diseases: Usual childhood illnesses of mumps,  measles and chickenpox; Surgical Procedures: Foot surgery x8 2015-2017, Back surgery 2011 2015; Trauma  History: No previous history of significant trauma.; Cardiology Procedures-Invasive: No previous interventional  or invasive cardiology procedures.; Cardiology Procedures-Noninvasive: No previous non-invasive cardiovascular testing.   PHYSICAL EXAMINATION    Vital Signs:  Blood Pressure:   120/72  124/70    Weight: 165.00 lbs.  Height:  65.00"  BMI: 27.45   Pulse: 80/min.        Constitutional: Cooperative, alert and oriented,well developed, well nourished, in no acute distress. Skin:  Warm and dry to touch, no apparent skin lesions, or masses noted. Head: Normocephalic, normal hair pattern,  no masses or tenderness Eyes: conjunctivae and lids normal ENT : No pallor or cyanosis Neck: no JVD, no bruits  Chest: clear to auscultation bilaterally, normal respiratory effort Cardiac: Regular rhythm, S1 normal,  S2 normal, No S3 or S4, Apical impulse not displaced, no murmurs, gallops or rubs detected. Abdomen: Abdomen soft, bowel sounds normoactive, no masses,  no hepatosplenomegaly, non-tender, no bruits Peripheral Pulses: Diminished but present pedal pulses.  Strong popliteals  bilaterally. Multiple right foot scars. Extremities/Back: Normal muscle strength and tone., No clubbing, cyanosis or edema  Neurological: No gross motor or sensory deficits noted, affect appropriate, oriented to time, person and place.   __     Medications added today by the physician:      IMPRESSIONS:   1. Orthostatic hypotension - likely  due to autonomic dysfunction.  2. Type 1 diabetes.  3. Peripheral  vascular disease.  4. Hypertension - low.  5. Hyperlipidemia.  6. Negative Lexiscan December 2018.  7. Normal echocardiogram with exception of mild MAC in December  2018.    RECOMMENDATIONS:   1. Advised the patient to stay off of all blood pressure medications at this time given severe  orthostasis.   2. Order for compression stockings 30-40 mmHg given and advised to wear during the day   and take them off at night.  3. Advised the patient to increase hydration.  4. Information to [facility] given along with phone number.   5. Follow up with Dr. Dimas Aguas in one year as planned or sooner if her presyncope worsens.    Joylene Igo, FNP-BC     Tid: [id number]:WUJ      cc: Shanda Howells MD

## 2021-04-04 NOTE — Progress Notes (Signed)
[facility] OFFICE   [address] [place], [place] [address]     [name] [name]    Date of Visit:  03/11/2019  Date of Birth: [birth year]  Age: 55 yrs.   Medical Record Number: [id number]  __  CURRENT DIAGNOSES     1. DM Type I uncomplicated, unspecified,  E10.9  2. Hyperlipidemia, mixed, E78.2  3. Nicotine dependence, cigarettes, uncomplicated, F17.210  4. PVD, bilateral legs unspecified, I70.203  5. Orthostatic Hypotension, I95.1  6. Spinal Stenosis, Lumbosacral Region, M48.07   7. Pre-op cardiovascular examination, Z01.810  8. Family history of ischemic heart disease and other diseases of the circulatory system, Z82.49  __  ALLERGIES     Actifed, Hives and/or rash  __  MEDICATIONS     1. levothyroxine 137 mcg tablet, 1 po qd   2. acyclovir 400 mg tablet, 500 mg qod  3. Humalog Mix 50-50 (U-100) Insulin 100 unit/mL subcutaneous suspension, use as directed, dosage unknown  4. vitamin E 1,000 unit capsule, 1 po qd  5. Calcium 600 + D(3) 600 mg calcium- 200 unit capsule,  1 po qd  6. B Complex 1 tablet, 1 po qd  7. Fish Oil 1,000 mg (120 mg-180 mg) capsule, 1 po qd  8. atorvastatin 20 mg tablet, 1 po qd  9. aspirin 81 mg chewable tablet, 1 po q am  10. midodrine 2.5 mg tablet, take 1 tab po tid last  dose 4 hours before bedtime  __  CHIEF COMPLAINT/REASON FOR VISIT  Followup of Orthostatic Hypotension  __   HISTORY OF PRESENT ILLNESS  [name] [name] with history of orthostatic hypotension, type 1 diabetes, peripheral vascular disease, hyperlipidemia, comes in today for followup. The patient states that she unfortunately  ordered the wrong size of compression stockings, so she has not been able to put them on. The patient states that she still gets intermittent presyncope, especially while changing positions, but has not had a fall. The patient's blood pressure today is  low with sitting measured at 100/60 and standing at 90/54. On exam, patient definitely seems dehydrated with dry oral mucosa and poor skin  turgor. The patient admits that she has not been keeping up with her fluids. The patient states that she does have  an appointment at [facility] in May. The patient is wondering whether Florinef will be appropriate for her. The patient denies any chest pain, shortness of breath and palpitations with ADL.  __   PAST HISTORY     Past Medical Illnesses: Hypothyroidism, Diabetes mellitus-insulin dependent, Hyperlipidemia;   Past Cardiac Illnesses: No previous history of cardiac disease.; Infectious Diseases : Usual childhood illnesses of mumps, measles and chickenpox; Surgical Procedures: Foot surgery x8 2015-2017, Back surgery 2011 2015;  Trauma History: No previous history of significant trauma.; Cardiology Procedures-Invasive: No previous  interventional or invasive cardiology procedures.; Cardiology Procedures-Noninvasive: Echocardiogram December 2018, MPI (Nuclear) Study 2018;  Left Ventricular Ejection Fraction: LVEF of 60% documented via echocardiogram on 12/16/2017  PHYSICAL EXAMINATION     Vital Signs:  Blood Pressure:  100/60 Sitting, Left arm, regular cuff  100/60 Sitting, Right arm,  regular cuff    Weight: 170.00 lbs.  Height: 65.00"   BMI: 28.29   Pulse: 70/min. Apical Regular   Respirations:  18/min.       Constitutional: Cooperative, alert and oriented,well developed, well nourished,  in no acute distress. Skin: Warm and dry to touch, no apparent skin lesions, or masses noted. Head : Normocephalic, normal hair  pattern, no masses or tenderness Eyes: conjunctivae and lids normal  ENT: No pallor or cyanosis Neck:  no JVD, no bruits Chest: clear to auscultation bilaterally, normal respiratory effort Cardiac : Regular rhythm, S1 normal, S2 normal, No S3 or S4, Apical impulse not displaced, no murmurs, gallops or rubs detected. Abdomen: abdomen soft, non-tender,  no bruits Peripheral Pulses: +2 bl radial puleses, R foot in boot  Extremities/Back: Normal muscle strength and tone.  Neurological:  No gross motor or sensory deficits noted, affect appropriate, oriented to time, person and place.   __    Medications added today by the physician:  midodrine 2.5 mg tablet, take 1 tab po tid last dose 4 hours before bedtime, 90     IMPRESSIONS:   1. Orthostatic hypotension - likely due to autonomic dysfunction. The patient has a pending appointment at [facility] in May.  2. Type 1 diabetes.  3. Peripheral  vascular disease.  4. Hyperlipidemia - on Lipitor.  5. Negative Lexiscan December 2018.  6. Normal echocardiogram with exception of mild MAC in December 2018.    RECOMMENDATIONS:    1. Given that patient has type 1 diabetes, I will start patient on midodrine instead of Florinef at this time. Advised patient to start 2.5 mg midodrine three times a day with the last dose occurring four hours before bedtime to prevent supine  hypertension.  2. Order for new compression stocking given, 30-40 mmHg, thigh high. Advised patient to wear during the day and take them off at night.  3. Advised patient to increase hydration. Informed patient to maybe carry a water bottle at  all times.  4. Follow with Dr. Dimas Aguas as planned in six weeks. Advised patient to bring her blood pressure readings at that time.    Joylene Igo, FNP-BC     Tid: [id number]:WU    cc: Shanda Howells MD

## 2021-04-04 NOTE — Progress Notes (Signed)
[facility] OFFICE  [address] [place], [place] [address]     [name]    Date of Visit:  04/07/2018  Date of Birth: 25-Feb-1966  Age: 55 yrs.   Medical Record Number: [id number]  __  CURRENT DIAGNOSES     1. DM Type I uncomplicated, unspecified,  E10.9  2. Hyperlipidemia, mixed, E78.2  3. Nicotine dependence, cigarettes, uncomplicated, F17.210  4. PVD, bilateral legs unspecified, I70.203  5. Spinal Stenosis, Lumbosacral Region, M48.07  6. Pre-op cardiovascular examination,  Z01.810  7. Family history of ischemic heart disease and other diseases of the circulatory system, Z82.49  __  ALLERGIES     Actifed, Hives and/or rash  __  MEDICATIONS     1. acyclovir 400 mg tablet, 500 mg qod   2. atorvastatin 10 mg tablet, 1 po qd, unknown dosage  3. B Complex 1 tablet, 1 po qd  4. Calcium 600 + D(3) 600 mg calcium- 200 unit capsule, 1 po qd  5. Fish Oil 1,000 mg (120 mg-180 mg) capsule, 1 po qd  6. Humalog Mix 50-50 (U-100)  Insulin 100 unit/mL subcutaneous suspension, use as directed, dosage unknown  7. levothyroxine 137 mcg tablet, 1 po qd  8. lisinopril 10 mg tablet, 1 po qd, dosage unknown  9. vitamin E 1,000 unit capsule, 1 po qd  __   CHIEF COMPLAINT/REASON FOR VISIT  Followup of cardiac risk factors.   __  HISTORY OF PRESENT ILLNESS   As you know, Ms. Holshouser is a very nice 55 year old with a history of type 1 diabetes mellitus for the past 30 years with a history of poor compliance. She is now on an insulin pump and much better. We saw her for preoperative consultation in December  and she underwent a normal Lexiscan nuclear test and transthoracic echo that was remarkable for mild mitral annular calcification. She has not had any chest pain or shortness of breath. She is going through the rehab process and is in much less pain from  her back as a result of the surgery she had. Her mobility is still not great. There have been no symptoms of concern. She remains on lisinopril and is having  occasional postural orthostasis that I think on occasion sounds like it may be related to poor  hydration. She continues with the atorvastatin. 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 A lot of the slow healing was related to recurrent pressure injury.   __  PAST HISTORY      Past Medical Illnesses: Hypothyroidism, Diabetes mellitus-insulin dependent, Hyperlipidemia;  Past Cardiac  Illnesses: No previous history of cardiac disease.; Infectious Diseases: Usual childhood illnesses of mumps,  measles and chickenpox; Surgical Procedures: Foot surgery x8 2015-2017, Back surgery 2011 2015; Trauma  History: No previous history of significant trauma.; Cardiology Procedures-Invasive: No previous interventional  or invasive cardiology procedures.; Cardiology Procedures-Noninvasive: No previous non-invasive cardiovascular testing.   ___  FAMILY HISTORY  Father -- Aortic aneurysm (unspecified  aneurysm)  MaternalGrandparent -- Heart disease (unspecified heart issues)  Mother --  Diabetes mellitus type 2  PaternalGrandparent -- Myocardial infarction  PaternalGrandparent --  Diabetes mellitus    __  CARDIAC RISK FACTORS     Tobacco Abuse : positive; Family History of Heart Disease: positive; Hyperlipidemia : positive; Hypertension:  negative;  Diabetes Mellitus : positive; Prior History of Heart Disease: negative; Obesity : negative; Sedentary Life Style:positive; GEX:BMWUXLKG;  Menopausal:biological menopause  __  SOCIAL HISTORY     Alcohol Use: Does not use alcohol; Smoking: 20 year history smoking, reports she recently quit 11/2017;  Current some day smoker ([id number]); Diet: Regular diet and Caffeine use-5 or more per day; Exercise : No regular exercise;   __  PHYSICAL EXAMINATION    Vital Signs :  Blood Pressure:  128/70  Sitting, Left arm, regular cuff  126/74 Sitting, Right arm, regular cuff     Weight: 163.00 lbs.  Height: 65.00"  BMI:  0   Pulse: 72/min. Apical Regular        Constitutional: Cooperative, alert and oriented,well developed, well nourished, in no acute distress. Skin:  Warm and dry to touch, no apparent skin lesions, or masses noted. Head: Normocephalic, normal hair pattern,  no masses or tenderness Eyes: EOMS Intact, PERRL, conjunctivae and lids normal. Funduscopic exam and visual fields not performed.  ENT: Ears, Nose and throat reveal no gross abnormalities. No pallor or cyanosis. Dentition good. Neck : No palpable masses or adenopathy, no thyromegaly, no JVD, carotid pulses are full and equal bilaterally without bruits. Chest : Normal symmetry, no tenderness to palpation, normal respiratory excursion, no intercostal retraction, no use of accessory muscles, normal diaphragmatic excursion, clear to auscultation and percussion.  Cardiac: Regular rhythm, S1 normal, S2 normal, No S3 or S4, Apical impulse not displaced, no murmurs, gallops or rubs detected. Abdomen : Abdomen soft, bowel sounds normoactive, no masses, no hepatosplenomegaly, non-tender, no bruits Peripheral Pulses:  Diminished but present pedal pulses. Strong popliteals bilaterally. Multiple right foot scars. Extremities/Back: No deformities, clubbing, cyanosis, erythema  or edema observed. There are no spinal abnormalities noted. Normal muscle strength and tone. Neurological:  No gross motor or sensory deficits noted, affect appropriate, oriented to time, person and place.   __    Medications added today by the physician:       ASSESSMENT:  1. Type 1 diabetes mellitus.   2. Multiple coronary risk factors.   3. Lower extremity vascular disease.   4. Hypertension   5. Hyperlipidemia.      PLAN:  At this time her medical issues seem well compensated. I have counseled her about avoiding smoking permanently from now on, which she so far has achieved, and in  addition she will continue her  preventive medical therapy. She will get [place] the records of her previous vascular evaluation. At this time there are no ulcers and she has no claudication. The importance of blood sugar control was discussed. I will see her back here in a year and on an  annual basis thereafter.     Tawni Pummel, MD, PhD, [facility], RPVI    [facility]    cc: Shanda Howells MD     AP  ____________________________  TODAYS ORDERS  Diet mgmt edu, guidance and counseling TODAY  Return Visit 30 MIN 1 year

## 2021-05-09 ENCOUNTER — Encounter (INDEPENDENT_AMBULATORY_CARE_PROVIDER_SITE_OTHER): Payer: Self-pay | Admitting: Cardiovascular Disease

## 2021-07-03 ENCOUNTER — Ambulatory Visit (INDEPENDENT_AMBULATORY_CARE_PROVIDER_SITE_OTHER): Payer: Medicare Other | Admitting: Cardiovascular Disease

## 2021-07-03 DIAGNOSIS — R9431 Abnormal electrocardiogram [ECG] [EKG]: Secondary | ICD-10-CM

## 2021-07-03 DIAGNOSIS — R0609 Other forms of dyspnea: Secondary | ICD-10-CM

## 2021-07-03 DIAGNOSIS — E109 Type 1 diabetes mellitus without complications: Secondary | ICD-10-CM

## 2021-07-03 DIAGNOSIS — R0600 Dyspnea, unspecified: Secondary | ICD-10-CM

## 2021-07-03 DIAGNOSIS — R0602 Shortness of breath: Secondary | ICD-10-CM

## 2021-07-03 NOTE — Procedures (Signed)
EXERCISE STRESS TEST    [facility] OFFICE      [place] [facility] OFFICE -CARDIOLOGY  [address]  [place] [address]  Dept: [phone]  Dept Fax: [phone]     Patient: [name] [name]  Sex: Female   DOB: 1966-07-07 (55 y.o.)  MRN:  [id number]     Test Date:  07/03/2021       Interpretation Date: 07/03/2021    Referring Physician: Willa Frater, PA     CLINICIAN: George Hugh, CT  SUPERVISING PROVIDER: Luella Cook, MD, [facility]    MEDICATIONS:  Patient has a current medication list which includes the following prescription(s): atorvastatin - 20 mg nightly   , b complex vitamins - Take 1 tablet by mouth daily, vitamin d3 - Take 2,000 Units by mouth daily, humalog - Inject 100 mLs into the skin as needed, freestyle - 1 each by Other route as needed, levothyroxine - Take 100 mcg by mouth nightly   , midodrine - Take 10 mg by mouth once as needed, fish oil - Take 1 capsule by mouth 2 (two) times daily, omeprazole - Take 40 mg by mouth daily, and vitamin e - Take 1,000 Units by mouth daily.    INDICATION:  (PVD)    GRADED ECG EXERCISE TEST:    ----- Protocol and Exercise Time -----   Protocol Bruce   Exercise Time (min) 6:00   ----- Heart Rate -----   Resting HR 75 bpm   Maximum HR 130 bpm   METS 7 METS   Percent Maximum Predicted HR 78 %    -----Blood Pressure -----   Resting BP 98/49mmHg   Maximum BP 180/80 mmHg     Patient Symptoms: L hip burning during exercise which resolved in recovery.    Reason for end: Leg pain    Resting ECG: Sinus Rhythm with isolated PVCs and early repolarization  Stress ECG: 1.5-2 mm of horizontal ST segment depression in the inferiolateral leads during peak exercise and early recovery that returned to baseline later in recovery.    Arrhythmias: Occasional PVCs during rest and recovery. One PAC in recovery.    INTERPRETATION:  Normal blood pressure response to exercise.  Exercise capacity average for age and gender.  No chest pain.  ST segment changes  with no symptoms.     CONCLUSIONS:  ST segment depression is likely a false positive given the normal blood pressure response and no symptoms (except hip pain).    For further evaluation, a nuclear medicine perfusion study has been ordered.    Results and recommendations conveyed to the patient.      INTERPRETED BY: Christin Fudge

## 2021-07-05 ENCOUNTER — Ambulatory Visit: Admission: RE | Admit: 2021-07-05 | Payer: Medicare Other | Source: Ambulatory Visit

## 2021-07-16 ENCOUNTER — Inpatient Hospital Stay
Admission: RE | Admit: 2021-07-16 | Discharge: 2021-07-16 | Disposition: A | Discharge status: Home / Self Care | Payer: Medicare Other | Source: Ambulatory Visit | Attending: Cardiovascular Disease | Admitting: Cardiovascular Disease

## 2021-07-16 DIAGNOSIS — Z79899 Other long term (current) drug therapy: Secondary | ICD-10-CM | POA: Insufficient documentation

## 2021-07-16 DIAGNOSIS — R0600 Dyspnea, unspecified: Secondary | ICD-10-CM

## 2021-07-16 DIAGNOSIS — R9431 Abnormal electrocardiogram [ECG] [EKG]: Secondary | ICD-10-CM

## 2021-07-16 DIAGNOSIS — R0609 Other forms of dyspnea: Secondary | ICD-10-CM | POA: Insufficient documentation

## 2021-07-16 DIAGNOSIS — E109 Type 1 diabetes mellitus without complications: Secondary | ICD-10-CM | POA: Insufficient documentation

## 2021-07-16 DIAGNOSIS — Z5329 Procedure and treatment not carried out because of patient's decision for other reasons: Secondary | ICD-10-CM | POA: Insufficient documentation

## 2021-07-16 MED ORDER — TECHNETIUM TC 99M TETROFOSMIN IV KIT
10.0000 | PACK | Freq: Once | INTRAVENOUS | Status: AC | PRN
Start: 2021-07-16 — End: 2021-07-16
  Administered 2021-07-16: 11:00:00 10 via INTRAVENOUS
  Filled 2021-07-16: qty 100

## 2021-07-24 NOTE — Progress Notes (Signed)
Patient arrived for her MPI. Was very distressed about the test and lots of stress going on in her life. We started an IV and she stated that it hurt too much and wanted it out and did not want to proceed on with the test. Then she changed her mind, stating that she has moved to [place] in the last week to take care of her parents. She calmed down and stated that she would continue on with the test. I started another IV and injected her with the tracer. When I was flushing with saline, the iv was infiltrated. When that happened, she stated that she was just not ready to do the test. I offered her to have the rest pics done here today. But she could not commit to being back in 7 days.  No images were taken today. She was injected with 10. of Myoview.    Author: Leron Croak, CNMT

## 2021-08-14 ENCOUNTER — Other Ambulatory Visit (INDEPENDENT_AMBULATORY_CARE_PROVIDER_SITE_OTHER): Payer: Self-pay | Admitting: Cardiovascular Disease

## 2021-08-14 DIAGNOSIS — R0609 Other forms of dyspnea: Secondary | ICD-10-CM

## 2021-08-14 DIAGNOSIS — E109 Type 1 diabetes mellitus without complications: Secondary | ICD-10-CM

## 2021-08-14 DIAGNOSIS — R0600 Dyspnea, unspecified: Secondary | ICD-10-CM

## 2021-08-14 DIAGNOSIS — R9431 Abnormal electrocardiogram [ECG] [EKG]: Secondary | ICD-10-CM

## 2021-08-16 ENCOUNTER — Ambulatory Visit (INDEPENDENT_AMBULATORY_CARE_PROVIDER_SITE_OTHER): Payer: Medicare Other

## 2021-08-16 ENCOUNTER — Encounter (HOSPITAL_COMMUNITY): Payer: Self-pay | Admitting: Emergency Medicine

## 2021-08-16 ENCOUNTER — Other Ambulatory Visit: Payer: Self-pay

## 2021-08-16 ENCOUNTER — Ambulatory Visit (HOSPITAL_COMMUNITY)
Admission: EM | Admit: 2021-08-16 | Discharge: 2021-08-16 | Disposition: A | Discharge status: Home / Self Care | Payer: Medicare Other | Attending: Family Medicine | Admitting: Family Medicine

## 2021-08-16 DIAGNOSIS — J189 Pneumonia, unspecified organism: Secondary | ICD-10-CM | POA: Insufficient documentation

## 2021-08-16 DIAGNOSIS — R0602 Shortness of breath: Secondary | ICD-10-CM | POA: Insufficient documentation

## 2021-08-16 DIAGNOSIS — M542 Cervicalgia: Secondary | ICD-10-CM | POA: Diagnosis not present

## 2021-08-16 DIAGNOSIS — R059 Cough, unspecified: Secondary | ICD-10-CM

## 2021-08-16 DIAGNOSIS — R61 Generalized hyperhidrosis: Secondary | ICD-10-CM | POA: Diagnosis present

## 2021-08-16 DIAGNOSIS — R5383 Other fatigue: Secondary | ICD-10-CM | POA: Insufficient documentation

## 2021-08-16 HISTORY — DX: Type 2 diabetes mellitus without complications: E11.9

## 2021-08-16 HISTORY — DX: Disorder of thyroid, unspecified: E07.9

## 2021-08-16 LAB — COMPREHENSIVE METABOLIC PANEL
ALT: 15 U/L (ref 0–44)
AST: 17 U/L (ref 15–41)
Albumin: 3.5 g/dL (ref 3.5–5.0)
Alkaline Phosphatase: 79 U/L (ref 38–126)
Anion gap: 10 (ref 5–15)
BUN: 16 mg/dL (ref 6–20)
CO2: 23 mmol/L (ref 22–32)
Calcium: 9.5 mg/dL (ref 8.9–10.3)
Chloride: 100 mmol/L (ref 98–111)
Creatinine, Ser: 1.16 mg/dL — ABNORMAL HIGH (ref 0.44–1.00)
GFR, Estimated: 56 mL/min — ABNORMAL LOW (ref >60)
Glucose, Bld: 307 mg/dL — ABNORMAL HIGH (ref 70–99)
Potassium: 5.1 mmol/L (ref 3.5–5.1)
Sodium: 133 mmol/L — ABNORMAL LOW (ref 135–145)
Total Bilirubin: 0.3 mg/dL (ref 0.3–1.2)
Total Protein: 7.4 g/dL (ref 6.5–8.1)

## 2021-08-16 MED ORDER — AMOXICILLIN-POT CLAVULANATE 875-125 MG PO TABS: 1.0000 | ORAL_TABLET | Freq: Two times a day (BID) | ORAL | 0 refills | Status: DC

## 2021-08-16 MED ORDER — AZITHROMYCIN 250 MG PO TABS: ORAL_TABLET | ORAL | 0 refills | Status: DC

## 2021-08-16 MED ORDER — FLUTICASONE PROPIONATE 50 MCG/ACT NA SUSP: 1.0000 | Freq: Two times a day (BID) | NASAL | 2 refills | Status: DC

## 2021-08-16 NOTE — ED Triage Notes (Signed)
Patient started feeling bad 8 days ago.  Patient reports sob, night sweats, no energy, cough ( lingering from COVID in January 2022).  Patient stresses the general fatigue.  "Feels like blood to brain is constricted..someone has hands around throat"

## 2021-08-16 NOTE — ED Notes (Signed)
Pt states that she had to go tend to her mother and would like someone to call her with results please.

## 2021-08-16 NOTE — ED Provider Notes (Signed)
[facility]    CSN: [id number] Arrival date & time: 08/16/21  0815      History   Chief Complaint Chief Complaint  Patient presents with   Cough    HPI Christina Bryan is a 55 y.o. female.   Patient presenting today with 8 to 10-day history of significant fatigue, decreased exercise tolerance, shortness of breath, night sweats, hacking productive cough, back pain, feeling like her brain is not getting blood circulation to it.  She is also having some ongoing right ear pain and muffled hearing following a double ear infection several weeks ago for which she completed antibiotics.  Denies abdominal pain, nausea, vomiting, diarrhea, congestion, sore throat.  COVID testing negative.  Has not been taking anything over-the-counter for symptoms thus far.  Past Medical History:  Diagnosis Date   Diabetes mellitus without complication (HCC)    Thyroid disease    There are no problems to display for this patient.  Past Surgical History:  Procedure Laterality Date   BACK SURGERY     FOOT AMPUTATION     OB History   No obstetric history on file.    Home Medications    Prior to Admission medications   Medication Sig Start Date End Date Taking? Authorizing Provider  acyclovir (ZOVIRAX) 400 MG tablet acyclovir 400 mg tablet  TK 1 T PO  BID 03/15/20  Yes [provider]  amoxicillin-clavulanate (AUGMENTIN) 875-125 MG tablet Take 1 tablet by mouth every 12 (twelve) hours. 08/16/21  Yes Particia Nearing, PA-C  azithromycin [facility]) 250 MG tablet Take 2 tabs day one, then 1 tab daily until completed 08/16/21  Yes Maurice March, Salley Hews, PA-C  fluticasone [facility]) 50 MCG/ACT nasal spray Place 1 spray into both nostrils 2 (two) times daily. 08/16/21  Yes Particia Nearing, PA-C  B Complex Vitamins (B COMPLEX 1 PO) B Complex    [provider]  celecoxib (CELEBREX) 200 MG capsule Take 200 mg by mouth daily. 02/28/21   [provider]  insulin  lispro (HUMALOG) 100 UNIT/ML injection Humalog U-100 Insulin 100 unit/mL subcutaneous solution  INJECT UP TO 50 UNITS UNDER THE SKIN DAILY WITH INSULIN PUMP    [provider]  insulin lispro (HUMALOG) 100 UNIT/ML injection Inject into the skin. 04/11/21   [provider]  levothyroxine (SYNTHROID) 100 MCG tablet levothyroxine 100 mcg tablet  take 1 tab po QD    [provider]  levothyroxine (SYNTHROID) 125 MCG tablet levothyroxine 125 mcg tablet  TAKE 1 TABLET BY MOUTH DAILY MONDAY THROUGH SATURDAY    [provider]  midodrine (PROAMATINE) 5 MG tablet Take by mouth. 05/20/21   [provider]  Omega-3 Fatty Acids (FISH OIL) 1000 MG CAPS Fish Oil    [provider]  omeprazole (PRILOSEC) 40 MG capsule Take 40 mg by mouth daily. 05/21/21   [provider]    Family History Family History  Problem Relation Age of Onset   Dementia Mother    CAD Father    Social History Social History   Tobacco Use   Smoking status: Former    Types: Cigarettes   Smokeless tobacco: Never  Vaping Use   Vaping Use: Never used  Substance Use Topics   Alcohol use: Never   Drug use: Yes    Types: Marijuana     Allergies   Patient has no known allergies.   Review of Systems Review of Systems Per HPI  Physical Exam Triage Vital Signs ED Triage  Vitals  Enc Vitals Group     BP 08/16/21 0908 (!) 154/83     Pulse Rate 08/16/21 0908 73     Resp 08/16/21 0908 20     Temp 08/16/21 0908 98.2 F (36.8 C)     Temp Source 08/16/21 0908 Oral     SpO2 08/16/21 0908 97 %     Weight --      Height --      Head Circumference --      Peak Flow --      Pain Score 08/16/21 0901 4     Pain Loc --      Pain Edu? --      Excl. in [place]? --    No data found.  Updated Vital Signs BP (!) 154/83 (BP Location: Right Arm)   Pulse 73   Temp 98.2 F (36.8 C) (Oral)   Resp 20   SpO2 97%   Visual Acuity Right Eye Distance:   Left Eye Distance:    Bilateral Distance:    Right Eye Near:   Left Eye Near:    Bilateral Near:     Physical Exam Vitals and nursing note reviewed.  Constitutional:      Appearance: Normal appearance. She is not ill-appearing.  HENT:     Head: Atraumatic.     Right Ear: Tympanic membrane normal.     Left Ear: Tympanic membrane normal.     Nose: Rhinorrhea present.     Mouth/Throat:     Mouth: Mucous membranes are moist.     Pharynx: Oropharynx is clear. No posterior oropharyngeal erythema.  Eyes:     Extraocular Movements: Extraocular movements intact.     Conjunctiva/sclera: Conjunctivae normal.  Neck:     Comments: Mild right-sided cervical paraspinal muscles tender to palpation extending to trapezius Cardiovascular:     Rate and Rhythm: Normal rate and regular rhythm.     Pulses: Normal pulses.     Heart sounds: Normal heart sounds.  Pulmonary:     Effort: Pulmonary effort is normal. No respiratory distress.     Breath sounds: Normal breath sounds. No wheezing or rales.  Abdominal:     General: Bowel sounds are normal. There is no distension.     Palpations: Abdomen is soft.     Tenderness: There is no abdominal tenderness. There is no right CVA tenderness, left CVA tenderness or guarding.  Musculoskeletal:        General: Normal range of motion.     Cervical back: Normal range of motion and neck supple. Tenderness present.  Lymphadenopathy:     Cervical: No cervical adenopathy.  Skin:    General: Skin is warm and dry.  Neurological:     Mental Status: She is alert and oriented to person, place, and time.     Motor: No weakness.     Gait: Gait normal.  Psychiatric:        Mood and Affect: Mood normal.        Thought Content: Thought content normal.        Judgment: Judgment normal.   UC Treatments / Results  Labs (all labs ordered are listed, but only abnormal results are displayed) Labs Reviewed  COMPREHENSIVE METABOLIC PANEL - Abnormal; Notable for the following components:       Result Value   Sodium 133 (*)    Glucose, Bld 307 (*)    Creatinine, Ser 1.16 (*)    GFR, Estimated 56 (*)  All other components within normal limits   EKG  Radiology DG Chest 2 View  Result Date: 08/16/2021 CLINICAL DATA:  Cough and shortness of breath. EXAM: CHEST - 2 VIEW COMPARISON:  None. FINDINGS: The heart size and mediastinal contours are within normal limits. Normal pulmonary vascularity. Patchy airspace disease in the right lower lobe. No pleural effusion or pneumothorax. No acute osseous abnormality. Thoracic spinal cord stimulator noted. IMPRESSION: 1. Right lower lobe pneumonia. Electronically Signed   By: Obie Dredge M.D.   On: 08/16/2021 10:21    Procedures Procedures (including critical care time)  Medications Ordered in UC Medications - No data to display  Initial Impression / Assessment and Plan / UC Course  I have reviewed the triage vital signs and the nursing notes.  Pertinent labs & imaging results that were available during my care of the patient were reviewed by me and considered in my medical decision making (see chart for details).     Very mildly hypertensive in triage but otherwise vital signs benign and reassuring.  Exam overall very reassuring.  Chest x-ray performed given her continued shortness of breath and cough, this showed a right lower lobe pneumonia.  Will cover with Augmentin, Zithromax and give Flonase for her left ear effusion.  Discussed supportive care for her neck soreness which I suspect to be muscular in nature.  Close PCP follow-up and strict ED precautions given for worsening symptoms.  Final Clinical Impressions(s) / UC Diagnoses   Final diagnoses:  Community acquired pneumonia of right lower lobe of lung  Fatigue, unspecified type  Night sweats  SOB (shortness of breath)  Neck pain on right side   Discharge Instructions   None    ED Prescriptions     Medication Sig Dispense Auth. Provider   amoxicillin-clavulanate  (AUGMENTIN) 875-125 MG tablet Take 1 tablet by mouth every 12 (twelve) hours. 14 tablet Particia Nearing, [place]   azithromycin (ZITHROMAX) 250 MG tablet Take 2 tabs day one, then 1 tab daily until completed 6 tablet Particia Nearing, PA-C   fluticasone [facility]) 50 MCG/ACT nasal spray Place 1 spray into both nostrils 2 (two) times daily. 16 g Particia Nearing, [place]      PDMP not reviewed this encounter.   Particia Nearing, [place] 08/16/21 1733

## 2021-08-29 ENCOUNTER — Encounter (HOSPITAL_COMMUNITY): Payer: Self-pay

## 2021-08-29 ENCOUNTER — Ambulatory Visit (INDEPENDENT_AMBULATORY_CARE_PROVIDER_SITE_OTHER): Payer: Medicare Other

## 2021-08-29 ENCOUNTER — Ambulatory Visit (HOSPITAL_COMMUNITY)
Admission: EM | Admit: 2021-08-29 | Discharge: 2021-08-29 | Disposition: A | Discharge status: Home / Self Care | Payer: Medicare Other | Attending: Physician Assistant | Admitting: Physician Assistant

## 2021-08-29 DIAGNOSIS — L03115 Cellulitis of right lower limb: Secondary | ICD-10-CM

## 2021-08-29 DIAGNOSIS — Z89421 Acquired absence of other right toe(s): Secondary | ICD-10-CM

## 2021-08-29 DIAGNOSIS — E1065 Type 1 diabetes mellitus with hyperglycemia: Secondary | ICD-10-CM

## 2021-08-29 DIAGNOSIS — L089 Local infection of the skin and subcutaneous tissue, unspecified: Secondary | ICD-10-CM

## 2021-08-29 DIAGNOSIS — M79671 Pain in right foot: Secondary | ICD-10-CM | POA: Diagnosis not present

## 2021-08-29 DIAGNOSIS — L97511 Non-pressure chronic ulcer of other part of right foot limited to breakdown of skin: Secondary | ICD-10-CM | POA: Diagnosis not present

## 2021-08-29 DIAGNOSIS — E10622 Type 1 diabetes mellitus with other skin ulcer: Secondary | ICD-10-CM

## 2021-08-29 DIAGNOSIS — Z794 Long term (current) use of insulin: Secondary | ICD-10-CM

## 2021-08-29 LAB — CBC WITH DIFFERENTIAL/PLATELET
Abs Immature Granulocytes: 0.02 10*3/uL (ref 0.00–0.07)
Basophils Absolute: 0.1 10*3/uL (ref 0.0–0.1)
Basophils Relative: 1 %
Eosinophils Absolute: 0.1 10*3/uL (ref 0.0–0.5)
Eosinophils Relative: 1 %
HCT: 32.5 % — ABNORMAL LOW (ref 36.0–46.0)
Hemoglobin: 10.7 g/dL — ABNORMAL LOW (ref 12.0–15.0)
Immature Granulocytes: 0 %
Lymphocytes Relative: 18 %
Lymphs Abs: 1.7 10*3/uL (ref 0.7–4.0)
MCH: 30.5 pg (ref 26.0–34.0)
MCHC: 32.9 g/dL (ref 30.0–36.0)
MCV: 92.6 fL (ref 80.0–100.0)
Monocytes Absolute: 0.8 10*3/uL (ref 0.1–1.0)
Monocytes Relative: 9 %
Neutro Abs: 6.9 10*3/uL (ref 1.7–7.7)
Neutrophils Relative %: 71 %
Platelets: 252 10*3/uL (ref 150–400)
RBC: 3.51 MIL/uL — ABNORMAL LOW (ref 3.87–5.11)
RDW: 12.6 % (ref 11.5–15.5)
WBC: 9.6 10*3/uL (ref 4.0–10.5)
nRBC: 0 % (ref 0.0–0.2)

## 2021-08-29 LAB — COMPREHENSIVE METABOLIC PANEL
ALT: 14 U/L (ref 0–44)
AST: 16 U/L (ref 15–41)
Albumin: 3.6 g/dL (ref 3.5–5.0)
Alkaline Phosphatase: 92 U/L (ref 38–126)
Anion gap: 6 (ref 5–15)
BUN: 12 mg/dL (ref 6–20)
CO2: 28 mmol/L (ref 22–32)
Calcium: 9.9 mg/dL (ref 8.9–10.3)
Chloride: 102 mmol/L (ref 98–111)
Creatinine, Ser: 1.06 mg/dL — ABNORMAL HIGH (ref 0.44–1.00)
GFR, Estimated: >60 mL/min (ref >60)
Glucose, Bld: 161 mg/dL — ABNORMAL HIGH (ref 70–99)
Potassium: 4.1 mmol/L (ref 3.5–5.1)
Sodium: 136 mmol/L (ref 135–145)
Total Bilirubin: 1.2 mg/dL (ref 0.3–1.2)
Total Protein: 7.1 g/dL (ref 6.5–8.1)

## 2021-08-29 LAB — SEDIMENTATION RATE: Sed Rate: 50 mm/hr — ABNORMAL HIGH (ref 0–22)

## 2021-08-29 MED ORDER — CEFTRIAXONE SODIUM 1 G IJ SOLR
INTRAMUSCULAR | Status: AC
  Filled 2021-08-29: qty 10

## 2021-08-29 MED ORDER — DOXYCYCLINE HYCLATE 100 MG PO CAPS: 100.0000 mg | ORAL_CAPSULE | Freq: Two times a day (BID) | ORAL | 0 refills | Status: DC

## 2021-08-29 MED ORDER — LIDOCAINE HCL (PF) 1 % IJ SOLN
INTRAMUSCULAR | Status: AC
  Filled 2021-08-29: qty 2

## 2021-08-29 MED ORDER — CEFTRIAXONE SODIUM 1 G IJ SOLR
1.0000 g | Freq: Once | INTRAMUSCULAR | Status: AC
Start: 2021-08-29 — End: 2021-08-29
  Administered 2021-08-29: 1 g via INTRAMUSCULAR

## 2021-08-29 MED ORDER — CEPHALEXIN 500 MG PO CAPS: 500.0000 mg | ORAL_CAPSULE | Freq: Three times a day (TID) | ORAL | 0 refills | Status: DC

## 2021-08-29 NOTE — Discharge Instructions (Addendum)
Your x-ray did not show any evidence of infection in her bone at this point which is great news.  We will contact you if your lab work is abnormal and if you have a significantly elevated white blood cell count you may need to go to the hospital as we discussed.  We gave you a shot of antibiotics today and I would like you to start cephalexin 3 times a day for 10 days as well as doxycycline 100 mg twice a day for 10 days.  Make sure to eat with antibiotics as they can upset your stomach.  Use over-the-counter medications as needed for pain relief.  If area continues to spread or you develop any additional symptoms including fever, nausea, vomiting, worsening diarrhea you need to go directly to the emergency room as we discussed.  Please call podiatry for an appointment today or tomorrow if possible.

## 2021-08-29 NOTE — ED Provider Notes (Signed)
[facility]    CSN: [id number] Arrival date & time: 08/29/21  1028      History   Chief Complaint Chief Complaint  Patient presents with  . Wound Infection    HPI Christina Bryan is a 55 y.o. female.   Patient presents today with a several day history of wound on the bottom of her right foot.  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  She reports pain is rated 6 on a 0-10 pain scale, localized to dorsal right foot without radiation, described as aching, no aggravating relieving factors identified.  She does report a pocket of fluid that drained purulent fluid yesterday.  She has cleaned affected area but has not taken any other over-the-counter medications for symptom management.  She recently moved to [facility] and does not currently have a podiatrist is open to establishing with someone in this clinic.  She is a type I diabetic and reports that her blood sugars have been adequately controlled over the past several days; currently blood sugar is 164.  She was recently treated with Augmentin and azithromycin for pneumonia and reports last dose of antibiotics was approximately 4 days ago.   Past Medical History:  Diagnosis Date  . Diabetes mellitus without complication ([place])   . Thyroid disease     There are no problems to display for this patient.   Past Surgical History:  Procedure Laterality Date  . BACK SURGERY    . FOOT AMPUTATION      OB History   No obstetric history on file.      Home Medications    Prior to Admission medications   Medication Sig Start Date End Date Taking? Authorizing Provider  cephALEXin (KEFLEX) 500 MG capsule Take 1 capsule (500 mg total) by mouth 3 (three) times daily. 08/29/21  Yes Nichoel Digiulio K, PA-C  doxycycline (VIBRAMYCIN) 100 MG capsule Take 1 capsule (100 mg total) by mouth 2  (two) times daily. 08/29/21  Yes Reeder Brisby, Derry Skill, PA-C  acyclovir (ZOVIRAX) 400 MG tablet acyclovir 400 mg tablet  TK 1 T PO  BID 03/15/20   [provider]  B Complex Vitamins (B COMPLEX 1 PO) B Complex    [provider]  celecoxib (CELEBREX) 200 MG capsule Take 200 mg by mouth daily. 02/28/21   [provider]  fluticasone (FLONASE) 50 MCG/ACT nasal spray Place 1 spray into both nostrils 2 (two) times daily. 08/16/21   Volney American, PA-C  insulin lispro (HUMALOG) 100 UNIT/ML injection Humalog U-100 Insulin 100 unit/mL subcutaneous solution  INJECT UP TO 50 UNITS UNDER THE SKIN DAILY WITH INSULIN PUMP    [provider]  insulin lispro (HUMALOG) 100 UNIT/ML injection Inject into the skin. 04/11/21   [provider]  levothyroxine (SYNTHROID) 100 MCG tablet levothyroxine 100 mcg tablet  take 1 tab po QD    [provider]  levothyroxine (SYNTHROID) 125 MCG tablet levothyroxine 125 mcg tablet  TAKE 1 TABLET BY MOUTH DAILY MONDAY THROUGH SATURDAY    [provider]  midodrine (PROAMATINE) 5 MG tablet Take by mouth. 05/20/21   [provider]  Omega-3 Fatty Acids (FISH OIL) 1000 MG CAPS Fish Oil    [provider]  omeprazole (PRILOSEC) 40 MG capsule Take 40 mg by mouth daily. 05/21/21   [provider]  Family History Family History  Problem Relation Age of Onset  . Dementia Mother   . CAD Father     Social History Social History   Tobacco Use  . Smoking status: Former    Types: Cigarettes  . Smokeless tobacco: Never  Vaping Use  . Vaping Use: Never used  Substance Use Topics  . Alcohol use: Never  . Drug use: Yes    Types: Marijuana     Allergies   Patient has no known allergies.   Review of Systems Review of Systems  Constitutional:  Positive for activity change. Negative for appetite change, fatigue and fever.  Respiratory:  Negative for cough and shortness of breath.    Cardiovascular:  Negative for chest pain.  Gastrointestinal:  Positive for diarrhea (chronic). Negative for abdominal pain, nausea and vomiting.  Musculoskeletal:  Negative for arthralgias and myalgias.  Skin:  Positive for color change and wound.  Neurological:  Negative for dizziness, light-headedness and headaches.    Physical Exam Triage Vital Signs ED Triage Vitals  Enc Vitals Group     BP 08/29/21 1305 128/78     Pulse Rate 08/29/21 1305 77     Resp 08/29/21 1305 18     Temp 08/29/21 1305 98.2 F (36.8 C)     Temp Source 08/29/21 1305 Oral     SpO2 08/29/21 1305 98 %     Weight --      Height --      Head Circumference --      Peak Flow --      Pain Score 08/29/21 1302 6     Pain Loc --      Pain Edu? --      Excl. in [place]? --    No data found.  Updated Vital Signs BP 128/78 (BP Location: Right Arm)   Pulse 77   Temp 98.2 F (36.8 C) (Oral)   Resp 18   SpO2 98%   Visual Acuity Right Eye Distance:   Left Eye Distance:   Bilateral Distance:    Right Eye Near:   Left Eye Near:    Bilateral Near:     Physical Exam Vitals reviewed.  Constitutional:      General: She is awake. She is not in acute distress.    Appearance: Normal appearance. She is normal weight. She is not ill-appearing.     Comments: Very pleasant female presented to no acute distress sitting comfortably in no acute distress  HENT:     Head: Normocephalic and atraumatic.  Cardiovascular:     Rate and Rhythm: Normal rate and regular rhythm.     Pulses:          Posterior tibial pulses are 2+ on the right side and 2+ on the left side.     Heart sounds: Normal heart sounds, S1 normal and S2 normal. No murmur heard. Pulmonary:     Effort: Pulmonary effort is normal.     Breath sounds: Normal breath sounds. No wheezing, rhonchi or rales.     Comments: Clear difficulties bilaterally Abdominal:     Palpations: Abdomen is soft.     Tenderness: There is no abdominal tenderness.   Musculoskeletal:     Right foot: Deformity present.  Feet:     Right foot:     Protective Sensation: 4 sites tested.  0 sites sensed.     Skin integrity: Ulcer, skin breakdown, erythema and warmth present.     Comments: Right foot: Several ulcerated  lesions noted on dorsal right foot with active purulent drainage from lesion at base of phalanges.  Surgically absent fifth digit.  Warm to touch with surrounding lesion. Psychiatric:        Behavior: Behavior is cooperative.     UC Treatments / Results  Labs (all labs ordered are listed, but only abnormal results are displayed) Labs Reviewed  COMPREHENSIVE METABOLIC PANEL  CBC WITH DIFFERENTIAL/PLATELET  SEDIMENTATION RATE    EKG   Radiology DG Foot Complete Right  Result Date: 08/29/2021 CLINICAL DATA:  Concern for osteomyelitis, history of foot surgery in diabetic EXAM: RIGHT FOOT COMPLETE - 3+ VIEW COMPARISON:  None. 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 There is screw fixation of the second through fourth toes across the proximal interphalangeal joints. There is no evidence of hardware related complication. There is no evidence of osseous destruction. Nine alignment is normal. There is mild degenerative change about the midfoot. IMPRESSION: No definite evidence of osseous destruction to suggest osteomyelitis. If there is persistent clinical concern, MRI or nuclear medicine study may be obtained. Electronically Signed   By: Valetta Mole M.D.   On: 08/29/2021 13:51    Procedures Procedures (including critical care time)  Medications Ordered in UC Medications  cefTRIAXone (ROCEPHIN) injection 1 g (has no administration in time range)    Initial Impression / Assessment and Plan / UC Course  I have reviewed the triage vital signs and the nursing notes.  Pertinent labs & imaging results that were available during my care of the patient were reviewed by me and considered in my  medical decision making (see chart for details).      X-ray obtained to rule out osteomyelitis showed no destructive changes.  CBC, CMP, ESR obtained today.  Discussed that if she has significant leukocytosis or elevated sed rate she would need to go to the hospital.  She was given Rocephin in clinic and started on Keflex and doxycycline outpatient she was previously had negative reactions with clindamycin and Bactrim.  She was encouraged to monitor her blood sugar closely and if she has significant hyperglycemia she should be reevaluated.  Discussed the importance of close glycemic control to ensure adequate healing.  Patient is agreeable to establishing with a podiatrist and was given contact information for local group with instruction to call immediately to schedule an appointment ASAP.  Discussed that she needs to be reevaluated within 24 to 48 hours and if she is unable to see specialist in this timeframe she can contact our clinic for further evaluation.  Discussed alarm symptoms that warrant emergent evaluation.  Strict return precautions given to which patient expressed understanding.  Final Clinical Impressions(s) / UC Diagnoses   Final diagnoses:  Foot infection  Ulcer of right foot, limited to breakdown of skin (HCC)  Cellulitis of right lower extremity  Type 1 diabetes mellitus with hyperglycemia [facility])     Discharge Instructions      Your x-ray did not show any evidence of infection in her bone at this point which is great news.  We will contact you if your lab work is abnormal and if you have a significantly elevated white blood cell count you may need to go to the hospital as we discussed.  We gave you a shot of antibiotics today and I would like you to start cephalexin 3 times a day for 10 days as well as doxycycline 100 mg twice a day for 10 days.  Make sure to eat with antibiotics as they can upset your stomach.  Use over-the-counter medications as needed for pain relief.  If  area continues to spread or you develop any additional symptoms including fever, nausea, vomiting, worsening diarrhea you need to go directly to the emergency room as we discussed.  Please call podiatry for an appointment today or tomorrow if possible.     ED Prescriptions     Medication Sig Dispense Auth. Provider   cephALEXin (KEFLEX) 500 MG capsule Take 1 capsule (500 mg total) by mouth 3 (three) times daily. 30 capsule Tomesha Sargent K, PA-C   doxycycline (VIBRAMYCIN) 100 MG capsule Take 1 capsule (100 mg total) by mouth 2 (two) times daily. 20 capsule Adrienne Delay, Derry Skill, PA-C      PDMP not reviewed this encounter.   Terrilee Croak, PA-C 08/29/21 1418

## 2021-08-29 NOTE — ED Triage Notes (Signed)
Pt presents with an infection on bottom of right foot from stepping on vacuum cleaner cord X 2 days ago.  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

## 2021-09-06 ENCOUNTER — Ambulatory Visit (INDEPENDENT_AMBULATORY_CARE_PROVIDER_SITE_OTHER): Payer: Medicare Other | Admitting: Sports Medicine

## 2021-09-06 ENCOUNTER — Other Ambulatory Visit: Payer: Self-pay

## 2021-09-06 ENCOUNTER — Encounter: Payer: Self-pay | Admitting: *Deleted

## 2021-09-06 ENCOUNTER — Ambulatory Visit (INDEPENDENT_AMBULATORY_CARE_PROVIDER_SITE_OTHER): Payer: Medicare Other

## 2021-09-06 ENCOUNTER — Other Ambulatory Visit: Payer: Self-pay | Admitting: Sports Medicine

## 2021-09-06 DIAGNOSIS — M2040 Other hammer toe(s) (acquired), unspecified foot: Secondary | ICD-10-CM

## 2021-09-06 DIAGNOSIS — L03119 Cellulitis of unspecified part of limb: Secondary | ICD-10-CM | POA: Diagnosis not present

## 2021-09-06 DIAGNOSIS — M79672 Pain in left foot: Secondary | ICD-10-CM

## 2021-09-06 DIAGNOSIS — L02619 Cutaneous abscess of unspecified foot: Secondary | ICD-10-CM

## 2021-09-06 DIAGNOSIS — Z89421 Acquired absence of other right toe(s): Secondary | ICD-10-CM

## 2021-09-06 DIAGNOSIS — M79671 Pain in right foot: Secondary | ICD-10-CM

## 2021-09-06 DIAGNOSIS — M7752 Other enthesopathy of left foot: Secondary | ICD-10-CM

## 2021-09-06 DIAGNOSIS — Z8781 Personal history of (healed) traumatic fracture: Secondary | ICD-10-CM

## 2021-09-06 DIAGNOSIS — E1010 Type 1 diabetes mellitus with ketoacidosis without coma: Secondary | ICD-10-CM

## 2021-09-06 NOTE — Progress Notes (Signed)
Subjective: Christina Bryan is a 55 y.o. female patient seen in office for evaluation of abscess of the right plantar forefoot.  Patient reports that 2 Mondays ago she stepped on a cord and afterwards the right foot became swollen and red and she saw a blister appearance to the ball of the foot went to urgent care last week where she was started on Keflex and doxycycline.  Reports that it seems to be doing better draining less has been covering with gauze and antibiotic cream.  Patient also reports that she wants her left foot checked as well thinks she broke the left third toe back in July there is no current pain but is noticing that it is still staying a little swollen.  Patient reports that she just recently relocated to [place] 1 month ago and is trying to reestablish care. Denies nausea/fever/vomiting/chills/night sweats/shortness of breath/pain. Patient has no other pedal complaints at this time.  Fasting blood sugar not recorded at this visit  Patient Active Problem List   Diagnosis Date Noted   Autonomic neuropathy 07/30/2019   History of amputation of foot (HCC) 01/04/2019   Chronic orthostatic hypotension 01/04/2019   Dizziness 07/07/2018   Mixed hyperlipidemia 05/08/2018   History of iron deficiency 03/26/2018   Vitamin D deficiency 03/26/2018   Acute back pain with sciatica 12/14/2017   Postablative hypothyroidism 12/14/2017   Type 1 diabetes mellitus (HCC) 12/14/2017   Current Outpatient Medications on File Prior to Visit  Medication Sig Dispense Refill   acyclovir (ZOVIRAX) 400 MG tablet acyclovir 400 mg tablet  TK 1 T PO  BID     atorvastatin (LIPITOR) 20 MG tablet atorvastatin 20 mg tablet     B Complex Vitamins (B COMPLEX 1 PO) B Complex     cefdinir (OMNICEF) 300 MG capsule Take 300 mg by mouth 2 (two) times daily.     celecoxib (CELEBREX) 200 MG capsule celecoxib 200 mg capsule     cephALEXin (KEFLEX) 500 MG capsule Take 1 capsule (500 mg total) by mouth 3 (three)  times daily. 30 capsule 0   doxycycline (VIBRAMYCIN) 100 MG capsule Take 1 capsule (100 mg total) by mouth 2 (two) times daily. 20 capsule 0   fluticasone (FLONASE) 50 MCG/ACT nasal spray Place 1 spray into both nostrils 2 (two) times daily. 16 g 2   insulin lispro (HUMALOG) 100 UNIT/ML injection Humalog U-100 Insulin 100 unit/mL subcutaneous solution  INJECT UP TO 50 UNITS UNDER THE SKIN DAILY WITH INSULIN PUMP     levothyroxine (SYNTHROID) 100 MCG tablet levothyroxine 100 mcg tablet  take 1 tab po QD     lidocaine (LIDODERM) 5 % lidocaine 5 % topical patch     midodrine (PROAMATINE) 5 MG tablet Take by mouth.     Omega-3 Fatty Acids (FISH OIL) 1000 MG CAPS Fish Oil     omeprazole (PRILOSEC) 40 MG capsule Take 40 mg by mouth daily.     omeprazole (PRILOSEC) 40 MG capsule omeprazole 40 mg capsule,delayed release     No current facility-administered medications on file prior to visit.   Allergies  Allergen Reactions   Cranberry Hives    Recent Results (from the past 2160 hour(s))  Comprehensive metabolic panel     Status: Abnormal   Collection Time: 08/16/21  9:49 AM  Result Value Ref Range   Sodium 133 (L) 135 - 145 mmol/L   Potassium 5.1 3.5 - 5.1 mmol/L   Chloride 100 98 - 111 mmol/L   CO2 23  22 - 32 mmol/L   Glucose, Bld 307 (H) 70 - 99 mg/dL    Comment: Glucose reference range applies only to samples taken after fasting for at least 8 hours.   BUN 16 6 - 20 mg/dL   Creatinine, Ser 5.63 (H) 0.44 - 1.00 mg/dL   Calcium 9.5 8.9 - 87.5 mg/dL   Total Protein 7.4 6.5 - 8.1 g/dL   Albumin 3.5 3.5 - 5.0 g/dL   AST 17 15 - 41 U/L   ALT 15 0 - 44 U/L   Alkaline Phosphatase 79 38 - 126 U/L   Total Bilirubin 0.3 0.3 - 1.2 mg/dL   GFR, Estimated 56 (L) >60 mL/min    Comment: (NOTE) Calculated using the CKD-EPI Creatinine Equation (2021)    Anion gap 10 5 - 15    Comment: Performed at [facility], [address] [place], [place] [address]  Comprehensive metabolic panel      Status: Abnormal   Collection Time: 08/29/21  1:17 PM  Result Value Ref Range   Sodium 136 135 - 145 mmol/L   Potassium 4.1 3.5 - 5.1 mmol/L   Chloride 102 98 - 111 mmol/L   CO2 28 22 - 32 mmol/L   Glucose, Bld 161 (H) 70 - 99 mg/dL    Comment: Glucose reference range applies only to samples taken after fasting for at least 8 hours.   BUN 12 6 - 20 mg/dL   Creatinine, Ser 9.51 (H) 0.44 - 1.00 mg/dL   Calcium 9.9 8.9 - 88.4 mg/dL   Total Protein 7.1 6.5 - 8.1 g/dL   Albumin 3.6 3.5 - 5.0 g/dL   AST 16 15 - 41 U/L   ALT 14 0 - 44 U/L   Alkaline Phosphatase 92 38 - 126 U/L   Total Bilirubin 1.2 0.3 - 1.2 mg/dL   GFR, Estimated >16 >60 mL/min    Comment: (NOTE) Calculated using the CKD-EPI Creatinine Equation (2021)    Anion gap 6 5 - 15    Comment: Performed at [facility], [address], [place], [place] [address]  CBC with Differential     Status: Abnormal   Collection Time: 08/29/21  1:17 PM  Result Value Ref Range   WBC 9.6 4.0 - 10.5 K/uL   RBC 3.51 (L) 3.87 - 5.11 MIL/uL   Hemoglobin 10.7 (L) 12.0 - 15.0 g/dL   HCT 01.0 (L) 93.2 - 35.5 %   MCV 92.6 80.0 - 100.0 fL   MCH 30.5 26.0 - 34.0 pg   MCHC 32.9 30.0 - 36.0 g/dL   RDW 73.2 20.2 - 54.2 %   Platelets 252 150 - 400 K/uL   nRBC 0.0 0.0 - 0.2 %   Neutrophils Relative % 71 %   Neutro Abs 6.9 1.7 - 7.7 K/uL   Lymphocytes Relative 18 %   Lymphs Abs 1.7 0.7 - 4.0 K/uL   Monocytes Relative 9 %   Monocytes Absolute 0.8 0.1 - 1.0 K/uL   Eosinophils Relative 1 %   Eosinophils Absolute 0.1 0.0 - 0.5 K/uL   Basophils Relative 1 %   Basophils Absolute 0.1 0.0 - 0.1 K/uL   Immature Granulocytes 0 %   Abs Immature Granulocytes 0.02 0.00 - 0.07 K/uL    Comment: Performed at [facility], [address] [place], [place] [address]  Sedimentation rate     Status: Abnormal   Collection Time: 08/29/21  1:17 PM  Result Value Ref Range  Sed Rate 50 (H) 0 - 22 mm/hr    Comment: Performed at [facility],  [address] [place], [place] [id number]    Objective: There were no vitals filed for this visit.  General: Patient is awake, alert, oriented x 3 and in no acute distress.  Dermatology: Skin is warm and dry bilateral with a partial thickness ulceration present once the abscess/blister roof was removed which was necessary because of patient history of previous purulent drainage from the area ulcerated bed measures approximately 3 x 2 cm at the right plantar forefoot. There is a macerated border with a granular base. The ulceration does not probe to bone. There is no malodor, no active purulent drainage clear to yellow drainage noted, minimal localized erythema to the plantar forefoot and dorsal forefoot on the right.  Minimal edema to the right foot.   Minimal edema noted to the left third toe.  Noopening.  No acute signs of infection.   Vascular: Dorsalis Pedis pulse = 1/4 Bilateral,  Posterior Tibial pulse = 1/4 Bilateral,  Capillary Fill Time < 5 seconds  Neurologic: Protective sensation diminished bilateral.  Musculosketal: No pain to palpation to ulcer/abscess area plantar right foot.  Status post right fifth toe amputation.  Hammertoe deformity noted on the left foot.  Xrays, right foot x-rays reviewed from urgent care which is negative for any acute osteomyelitis.  Left foot x-rays obtained in office which is negative for any acute findings however there is possible suspicion of an old fracture at the midshaft of the proximal phalanx of the left third toe however nondisplaced with hammertoe deformity.  No results for input(s): GRAMSTAIN, LABORGA in the last 8760 hours.  Assessment and Plan:  Problem List Items Addressed This Visit       Endocrine   Type 1 diabetes mellitus (HCC)   Relevant Medications   atorvastatin (LIPITOR) 20 MG tablet   Other Visit Diagnoses     Cellulitis and abscess of foot, except toes    -  Primary   Relevant Orders   WOUND CULTURE   Pain in right foot        Pain in left foot       Hammer toe, unspecified laterality       History of toe fracture       History of amputation of lesser toe of right foot (HCC)            -Examined patient and discussed the progression of the wound and treatment alternatives. -Previous blood work from last week reviewed -Xrays reviewed - Excisionally dedbrided ulceration/abscess tissue at right plantar forefoot to healthy bleeding borders removing nonviable tissue using a sterile chisel blade. Wound measures post debridement as above wound was debrided to the level of the dermis with viable wound base exposed to promote healing. Hemostasis was achieved with manuel pressure. Patient tolerated procedure well without any discomfort or anesthesia necessary for this wound debridement.  -Wound culture was obtained from the wound bed of the right foot -Applied Betadine and dry sterile dressing and instructed patient to continue with daily dressings at home consisting of same daily -Advised patient to resume using her boot that she has at home -Advised patient to continue with Keflex and doxycycline until completed we will call patient if she needs further antibiotics however at this time no acute indication to extend - Advised patient to go to the ER or return to office if the wound worsens or if constitutional symptoms are present. -  Advised patient to closely monitor the left foot no acute treatment needed at this time and advised patient with any history of fracture it can take several months for swelling to go away -Patient to return to office in 1 week for follow up right foot wound care and evaluation or sooner if problems arise.  Asencion Islam, DPM

## 2021-09-09 LAB — WOUND CULTURE
MICRO NUMBER:: 12347614
SPECIMEN QUALITY:: ADEQUATE

## 2021-09-13 ENCOUNTER — Other Ambulatory Visit: Payer: Self-pay

## 2021-09-13 ENCOUNTER — Ambulatory Visit (INDEPENDENT_AMBULATORY_CARE_PROVIDER_SITE_OTHER): Payer: Medicare Other | Admitting: Sports Medicine

## 2021-09-13 ENCOUNTER — Encounter: Payer: Self-pay | Admitting: Sports Medicine

## 2021-09-13 DIAGNOSIS — E1010 Type 1 diabetes mellitus with ketoacidosis without coma: Secondary | ICD-10-CM

## 2021-09-13 DIAGNOSIS — L97511 Non-pressure chronic ulcer of other part of right foot limited to breakdown of skin: Secondary | ICD-10-CM | POA: Diagnosis not present

## 2021-09-13 DIAGNOSIS — L02619 Cutaneous abscess of unspecified foot: Secondary | ICD-10-CM

## 2021-09-13 DIAGNOSIS — Z89421 Acquired absence of other right toe(s): Secondary | ICD-10-CM

## 2021-09-13 DIAGNOSIS — L03119 Cellulitis of unspecified part of limb: Secondary | ICD-10-CM

## 2021-09-13 NOTE — Progress Notes (Signed)
Subjective: Christina Bryan is a 55 y.o. female patient seen in office for follow-up evaluation of right foot wound secondary to abscess/blister.  Patient reports that she is doing better has not noticed much drainage and states that she ended her antibiotics a few days early because of GI issues.  Denies any other constitutional symptoms at this time.  Patient Active Problem List   Diagnosis Date Noted   Autonomic neuropathy 07/30/2019   History of amputation of foot (HCC) 01/04/2019   Chronic orthostatic hypotension 01/04/2019   Dizziness 07/07/2018   Mixed hyperlipidemia 05/08/2018   History of iron deficiency 03/26/2018   Vitamin D deficiency 03/26/2018   Acute back pain with sciatica 12/14/2017   Postablative hypothyroidism 12/14/2017   Type 1 diabetes mellitus (HCC) 12/14/2017   Current Outpatient Medications on File Prior to Visit  Medication Sig Dispense Refill   acyclovir (ZOVIRAX) 400 MG tablet acyclovir 400 mg tablet  TK 1 T PO  BID     atorvastatin (LIPITOR) 20 MG tablet atorvastatin 20 mg tablet     B Complex Vitamins (B COMPLEX 1 PO) B Complex     cefdinir (OMNICEF) 300 MG capsule Take 300 mg by mouth 2 (two) times daily.     celecoxib (CELEBREX) 200 MG capsule celecoxib 200 mg capsule     cephALEXin (KEFLEX) 500 MG capsule Take 1 capsule (500 mg total) by mouth 3 (three) times daily. 30 capsule 0   doxycycline (VIBRAMYCIN) 100 MG capsule Take 1 capsule (100 mg total) by mouth 2 (two) times daily. 20 capsule 0   fluticasone (FLONASE) 50 MCG/ACT nasal spray Place 1 spray into both nostrils 2 (two) times daily. 16 g 2   insulin lispro (HUMALOG) 100 UNIT/ML injection Humalog U-100 Insulin 100 unit/mL subcutaneous solution  INJECT UP TO 50 UNITS UNDER THE SKIN DAILY WITH INSULIN PUMP     levothyroxine (SYNTHROID) 100 MCG tablet levothyroxine 100 mcg tablet  take 1 tab po QD     lidocaine (LIDODERM) 5 % lidocaine 5 % topical patch     midodrine (PROAMATINE) 5 MG tablet Take  by mouth.     Omega-3 Fatty Acids (FISH OIL) 1000 MG CAPS Fish Oil     omeprazole (PRILOSEC) 40 MG capsule Take 40 mg by mouth daily.     omeprazole (PRILOSEC) 40 MG capsule omeprazole 40 mg capsule,delayed release     No current facility-administered medications on file prior to visit.   Allergies  Allergen Reactions   Cranberry Hives    Recent Results (from the past 2160 hour(s))  Comprehensive metabolic panel     Status: Abnormal   Collection Time: 08/16/21  9:49 AM  Result Value Ref Range   Sodium 133 (L) 135 - 145 mmol/L   Potassium 5.1 3.5 - 5.1 mmol/L   Chloride 100 98 - 111 mmol/L   CO2 23 22 - 32 mmol/L   Glucose, Bld 307 (H) 70 - 99 mg/dL    Comment: Glucose reference range applies only to samples taken after fasting for at least 8 hours.   BUN 16 6 - 20 mg/dL   Creatinine, Ser 2.99 (H) 0.44 - 1.00 mg/dL   Calcium 9.5 8.9 - 37.1 mg/dL   Total Protein 7.4 6.5 - 8.1 g/dL   Albumin 3.5 3.5 - 5.0 g/dL   AST 17 15 - 41 U/L   ALT 15 0 - 44 U/L   Alkaline Phosphatase 79 38 - 126 U/L   Total Bilirubin 0.3 0.3 - 1.2 mg/dL  GFR, Estimated 56 (L) >60 mL/min    Comment: (NOTE) Calculated using the CKD-EPI Creatinine Equation (2021)    Anion gap 10 5 - 15    Comment: Performed at [facility], [address], [place], [place] [address]  Comprehensive metabolic panel     Status: Abnormal   Collection Time: 08/29/21  1:17 PM  Result Value Ref Range   Sodium 136 135 - 145 mmol/L   Potassium 4.1 3.5 - 5.1 mmol/L   Chloride 102 98 - 111 mmol/L   CO2 28 22 - 32 mmol/L   Glucose, Bld 161 (H) 70 - 99 mg/dL    Comment: Glucose reference range applies only to samples taken after fasting for at least 8 hours.   BUN 12 6 - 20 mg/dL   Creatinine, Ser 2.95 (H) 0.44 - 1.00 mg/dL   Calcium 9.9 8.9 - 28.4 mg/dL   Total Protein 7.1 6.5 - 8.1 g/dL   Albumin 3.6 3.5 - 5.0 g/dL   AST 16 15 - 41 U/L   ALT 14 0 - 44 U/L   Alkaline Phosphatase 92 38 - 126 U/L   Total Bilirubin 1.2  0.3 - 1.2 mg/dL   GFR, Estimated >13 >24 mL/min    Comment: (NOTE) Calculated using the CKD-EPI Creatinine Equation (2021)    Anion gap 6 5 - 15    Comment: Performed at [facility], [address], [place], [place] [address]  CBC with Differential     Status: Abnormal   Collection Time: 08/29/21  1:17 PM  Result Value Ref Range   WBC 9.6 4.0 - 10.5 K/uL   RBC 3.51 (L) 3.87 - 5.11 MIL/uL   Hemoglobin 10.7 (L) 12.0 - 15.0 g/dL   HCT 72.5 (L) 36.6 - 44.0 %   MCV 92.6 80.0 - 100.0 fL   MCH 30.5 26.0 - 34.0 pg   MCHC 32.9 30.0 - 36.0 g/dL   RDW 34.7 42.5 - 95.6 %   Platelets 252 150 - 400 K/uL   nRBC 0.0 0.0 - 0.2 %   Neutrophils Relative % 71 %   Neutro Abs 6.9 1.7 - 7.7 K/uL   Lymphocytes Relative 18 %   Lymphs Abs 1.7 0.7 - 4.0 K/uL   Monocytes Relative 9 %   Monocytes Absolute 0.8 0.1 - 1.0 K/uL   Eosinophils Relative 1 %   Eosinophils Absolute 0.1 0.0 - 0.5 K/uL   Basophils Relative 1 %   Basophils Absolute 0.1 0.0 - 0.1 K/uL   Immature Granulocytes 0 %   Abs Immature Granulocytes 0.02 0.00 - 0.07 K/uL    Comment: Performed at [facility], [address] [place], [place] [address]  Sedimentation rate     Status: Abnormal   Collection Time: 08/29/21  1:17 PM  Result Value Ref Range   Sed Rate 50 (H) 0 - 22 mm/hr    Comment: Performed at [facility], [address], [place], [place] [address]  WOUND CULTURE     Status: Abnormal   Collection Time: 09/06/21  5:05 PM   Specimen: Abscess; Wound  Result Value Ref Range   MICRO NUMBER: [id number]    SPECIMEN QUALITY: Adequate    SOURCE: RIGHT FOOT    STATUS: FINAL    GRAM STAIN:      No white blood cells seen Few epithelial cells Moderate Gram positive bacilli Few Gram positive cocci in clusters Few Gram negative bacilli   ISOLATE 1:  Serratia marcescens (A)     Comment: Light growth of Serratia marcescens      Susceptibility   Serratia marcescens - AEROBIC CULT, GRAM STAIN NEGATIVE 1    AMOX/CLAVULANIC  16 Resistant     CEFAZOLIN* >=64 Resistant      * For infections other than uncomplicated UTI caused by E. coli, K. pneumoniae or P. mirabilis: Cefazolin is resistant if MIC > or = 8 mcg/mL. (Distinguishing susceptible versus intermediate for isolates with MIC < or = 4 mcg/mL requires additional testing.)     CEFTAZIDIME <=1 Sensitive     CEFEPIME <=1 Sensitive     CEFTRIAXONE <=1 Sensitive     CIPROFLOXACIN <=0.25 Sensitive     LEVOFLOXACIN <=0.12 Sensitive     GENTAMICIN <=1 Sensitive     TOBRAMYCIN 4 Sensitive     TRIMETH/SULFA* <=20 Sensitive      * For infections other than uncomplicated UTI caused by E. coli, K. pneumoniae or P. mirabilis: Cefazolin is resistant if MIC > or = 8 mcg/mL. (Distinguishing susceptible versus intermediate for isolates with MIC < or = 4 mcg/mL requires additional testing.) Legend: S = Susceptible  I = Intermediate R = Resistant  NS = Not susceptible * = Not tested  NR = Not reported **NN = See antimicrobic comments     Objective: There were no vitals filed for this visit.  General: Patient is awake, alert, oriented x 3 and in no acute distress.  Dermatology: Skin is warm and dry bilateral with a partial thickness wound plantar right foot that measures 3 x 1-1/2 cm with a granular base there is small area of maceration noted to the sulcus area underneath the toes of the right foot with peeling blistered skin, minimal edema, resolved erythema, no malodor, no active drainage.  No other acute signs of infection.   Vascular: Dorsalis Pedis pulse = 1/4 Bilateral,  Posterior Tibial pulse = 1/4 Bilateral,  Capillary Fill Time < 5 seconds   Neurologic: Protective sensation diminished bilateral.   Musculosketal: No pain to palpation to ulcer/abscess area plantar right foot.  Status post right fifth toe amputation.  Hammertoe deformity noted on the left foot.   No results for input(s): GRAMSTAIN, LABORGA in the last 8760 hours.  Assessment and  Plan:  Problem List Items Addressed This Visit       Endocrine   Type 1 diabetes mellitus (HCC)   Other Visit Diagnoses     Right foot ulcer, limited to breakdown of skin (HCC)    -  Primary   Cellulitis and abscess of foot, except toes       History of amputation of lesser toe of right foot (HCC)            -Examined patient and discussed the progression of the wound and treatment alternatives. -Previous wound culture reviewed -Excisionally dedbrided ulceration at right plantar forefoot to healthy bleeding borders removing nonviable tissue using a sterile tissue nipper.  Wound measures post debridement as above. Wound was debrided to the level of the dermis with viable wound base exposed to promote healing. Hemostasis was achieved with manuel pressure. Patient tolerated procedure well without any discomfort or anesthesia necessary for this wound debridement.  -No additional antibiotics needed at this time -Applied Betadine and dry sterile dressing and instructed patient to continue with daily dressings at home consisting of same daily as long as there is maceration advised patient to use this however once the maceration has resolved may switch over to  using Neosporin antibiotic powder -Continue with cam boot - Advised patient to go to the ER or return to office if the wound worsens or if constitutional symptoms are present. -Patient to return to office in 2 weeks for follow up care and evaluation or sooner if problems arise.  Asencion Islam, DPM

## 2021-09-18 ENCOUNTER — Ambulatory Visit (INDEPENDENT_AMBULATORY_CARE_PROVIDER_SITE_OTHER): Payer: Medicare Other | Admitting: Internal Medicine

## 2021-09-18 ENCOUNTER — Encounter: Payer: Self-pay | Admitting: Internal Medicine

## 2021-09-18 VITALS — BP 132/66 | HR 73 | Temp 98.2°F | Ht 67.5 in | Wt 169.2 lb

## 2021-09-18 DIAGNOSIS — Z0000 Encounter for general adult medical examination without abnormal findings: Secondary | ICD-10-CM

## 2021-09-18 DIAGNOSIS — L02611 Cutaneous abscess of right foot: Secondary | ICD-10-CM | POA: Diagnosis not present

## 2021-09-18 DIAGNOSIS — E890 Postprocedural hypothyroidism: Secondary | ICD-10-CM | POA: Diagnosis not present

## 2021-09-18 DIAGNOSIS — Z720 Tobacco use: Secondary | ICD-10-CM

## 2021-09-18 DIAGNOSIS — E1010 Type 1 diabetes mellitus with ketoacidosis without coma: Secondary | ICD-10-CM | POA: Diagnosis not present

## 2021-09-18 DIAGNOSIS — H938X9 Other specified disorders of ear, unspecified ear: Secondary | ICD-10-CM | POA: Insufficient documentation

## 2021-09-18 DIAGNOSIS — E782 Mixed hyperlipidemia: Secondary | ICD-10-CM | POA: Diagnosis not present

## 2021-09-18 DIAGNOSIS — G909 Disorder of the autonomic nervous system, unspecified: Secondary | ICD-10-CM | POA: Diagnosis not present

## 2021-09-18 DIAGNOSIS — H938X1 Other specified disorders of right ear: Secondary | ICD-10-CM | POA: Diagnosis not present

## 2021-09-18 DIAGNOSIS — Z89431 Acquired absence of right foot: Secondary | ICD-10-CM

## 2021-09-18 LAB — POCT GLYCOSYLATED HEMOGLOBIN (HGB A1C): Hemoglobin A1C: 8.5 % — AB (ref 4.0–5.6)

## 2021-09-18 LAB — GLUCOSE, CAPILLARY: Glucose-Capillary: 152 mg/dL — ABNORMAL HIGH (ref 70–99)

## 2021-09-18 NOTE — Assessment & Plan Note (Signed)
Healing abscess on right foot. No signs of current infection.  Continue to follow with podiatry.

## 2021-09-18 NOTE — Assessment & Plan Note (Addendum)
Likely complication from poorly controlled DM with further effects including gastroparesis and chronic orthostatic hypotension.

## 2021-09-18 NOTE — Assessment & Plan Note (Signed)
She has a hx of amputation of right fifth toe which she reports was done several years ago after her foot became infected from an overflowing toilet. Furthermore, during this infection, she required a skin graft to close wound on sole of her foot. She has some residual nerve damage and is unable to feel gross touch on majority of right foot with the exception of her right great toe.

## 2021-09-18 NOTE — Assessment & Plan Note (Signed)
Currently taking lipitor 20mg . She has several other risk factors for cardiovascular disease including hx tobacco use and uncontrolled DM.  Will check lipid panel. If LDL >100, can increase statin dose.

## 2021-09-18 NOTE — Progress Notes (Signed)
CC: New to establish, diabetes  HPI:Ms.Cai Flott is a 55 y.o. female who presents for evaluation of establish care, diabetes. Please see individual problem based A/P for details.  Previously was seen in the emergency department on 8/18 for community-acquired pneumonia treated with Augmentin and azithromycin, and 8/31 for a foot infection given a dose of Rocephin in the clinic and started on Keflex and doxycycline as an outpatient.. Recently moved to [place] and only recently has established with a podiatrist. Here to establish care and discuss her diabetes.  She was referred from urgent care recently.  Pleas see encounters tab for problem based charting.  Problem List Items Addressed This Visit       Endocrine   Postablative hypothyroidism    Patient reports ablation 1999. She has been on synthroid for many years. Asymptomatic today.  Check TSH Continue Synthroid for now.       Relevant Orders   TSH   Type 1 diabetes mellitus (HCC)    Patient reports a history of type 1 diabetes diagnosed in her early 68s. She reports she did not properly control her disease for many years when younger.  Previously followed with [facility] prior to move. Was a patient with her for approximately 5 years. Records requested from her office. She has a medtronic pump that she has been using since January 2016. She reports that she checks her blood sugar at home, AM values typically 95-115 and PM values 295-395. Reports that when she has a1c checked, typically around 7.3.   Her diabetes does not appear to be well controlled currently as she he many complications from her disease including autonomic neuropathy, gastroparesis and chronic orthostatic hypotension. She reports that she must use omeprazole and routinely administers her insulin roughly 30 minutes after eating because her blood sugar can drop too low as a result of her gastroparesis if she administers insulin  before. Furthermore, she has had multiple infections of her feet, and has significantly decreased sensation bilaterally in her feet.  Her a1c 8.5 today.  Urine micoalbumin:creatine ordered.  Recommended referral to endocrinologist. Patient deferred for now, stating that she wants to find a female endocrinologist with whom she feels comfortable. She wants to find endocrinologist on her own. Informed pt that once she has decided on one, we will be more than happy to place referral for her.          Relevant Orders   POC Hbg A1C (Completed)   Microalbumin / Creatinine Urine Ratio     Nervous and Auditory   Autonomic neuropathy    Likely complication from poorly controlled DM with further effects including gastroparesis and chronic orthostatic hypotension.         Other   History of amputation of foot (HCC)    She has a hx of amputation of right fifth toe which she reports was done several years ago after her foot became infected from an overflowing toilet. Furthermore, during this infection, she required a skin graft to close wound on sole of her foot. She has some residual nerve damage and is unable to feel gross touch on majority of right foot with the exception of her right great toe.      Mixed hyperlipidemia - Primary    Currently taking lipitor 20mg . She has several other risk factors for cardiovascular disease including hx tobacco use and uncontrolled DM.  Will check lipid panel. If LDL >100, can increase statin dose.  Relevant Orders   Lipid Profile   Abscess of right foot    Healing abscess on right foot. No signs of current infection.  Continue to follow with podiatry.       Tobacco abuse    Hx tobacco use. Reports that she quit, but occasionally still smokes during social gatherings. Will need low dose chest CT scan at next appt.      Abnormal sensation in ear    Internal and external ear appear to be healthy. No drainage noted. Hearing intact.   Advised  against Q-tip use. Continue to keep ears clean.       Healthcare maintenance    Flu shot administered today.   Will need low dose chest CT at next appt due to hx tobacco use.        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  Past Medical History:  Diagnosis Date   Diabetes mellitus without complication (HCC)    Thyroid disease    Review of Systems:   Review of Systems  Constitutional: Negative.   HENT: Negative.    Respiratory: Negative.    Cardiovascular: Negative.   Gastrointestinal: Negative.   Genitourinary: Negative.   Musculoskeletal: Negative.   Skin: Negative.   Neurological: Negative.   Psychiatric/Behavioral: Negative.      Physical Exam: Vitals:   09/18/21 1438  BP: 132/66  Pulse: 73  Temp: 98.2 F (36.8 C)  TempSrc: Oral  SpO2: 100%  Weight: 169 lb 3.2 oz (76.7 kg)  Height: 5' 7.5" (1.715 m)     General: alert and oriented, no acute distress HEENT: Conjunctiva nl , antiicteric sclerae, moist mucous membranes, no exudate or erythema Cardiovascular: Normal rate, regular rhythm.  No murmurs, rubs, or gallops Pulmonary : Equal breath sounds, No wheezes, rales, or rhonchi Abdominal: soft, nontender,  bowel sounds present Ext: No edema in lower extremities, no tenderness to palpation of lower extremities.  Abrasion right knee Right 5th toe surgically missing,  healing abscess ball right foot, scar, scar sole of right foot. Decreased sensation to gross touch bilaterally.    Assessment & Plan:   See Encounters Tab for problem based charting.  Patient seen with Dr. Oswaldo Done

## 2021-09-18 NOTE — Assessment & Plan Note (Signed)
Hx tobacco use. Reports that she quit, but occasionally still smokes during social gatherings. Will need low dose chest CT scan at next appt.

## 2021-09-18 NOTE — Assessment & Plan Note (Signed)
Patient reports ablation 1999. She has been on synthroid for many years. Asymptomatic today.  Check TSH Continue Synthroid for now.

## 2021-09-18 NOTE — Assessment & Plan Note (Signed)
Flu shot administered today.   Will need low dose chest CT at next appt due to hx tobacco use.

## 2021-09-18 NOTE — Patient Instructions (Addendum)
Dear Christina Bryan,  Today we discussed your diabetes, recent foot infection, high cholesterol, hypothyroidism, and right ear.  For your type 1 diabetes we will check an A1c today as well as a urine micro/creatinine to assess for overall kidney function.  Once you have found an endocrinologist that you are happy with we will place a referral for you. For your hyperlipidemia we will check a lipid panel today if your cholesterol still high we may need to increase your statin.   For your hypothyroidism, we will check a TSH to see if your medication needs to be adjusted. We also administered a flu shot today. Please continue to follow with your podiatrist at [facility]. Please follow-up in 3 months for diabetes recheck.

## 2021-09-18 NOTE — Assessment & Plan Note (Signed)
Internal and external ear appear to be healthy. No drainage noted. Hearing intact.   Advised against Q-tip use. Continue to keep ears clean.

## 2021-09-18 NOTE — Assessment & Plan Note (Addendum)
Patient reports a history of type 1 diabetes diagnosed in her early 9s. She reports she did not properly control her disease for many years when younger.  Previously followed with [facility] prior to move. Was a patient with her for approximately 5 years. Records requested from her office. She has a medtronic pump that she has been using since January 2016. She reports that she checks her blood sugar at home, AM values typically 95-115 and PM values 295-395. Reports that when she has a1c checked, typically around 7.3.   Her diabetes does not appear to be well controlled currently as she he many complications from her disease including autonomic neuropathy, gastroparesis and chronic orthostatic hypotension. She reports that she must use omeprazole and routinely administers her insulin roughly 30 minutes after eating because her blood sugar can drop too low as a result of her gastroparesis if she administers insulin before. Furthermore, she has had multiple infections of her feet, and has significantly decreased sensation bilaterally in her feet.  Her a1c 8.5 today.  Urine micoalbumin:creatine ordered.  Recommended referral to endocrinologist. Patient deferred for now, stating that she wants to find a female endocrinologist with whom she feels comfortable. She wants to find endocrinologist on her own. Informed pt that once she has decided on one, we will be more than happy to place referral for her.

## 2021-09-19 LAB — LIPID PANEL
Chol/HDL Ratio: 2 ratio (ref 0.0–4.4)
Cholesterol, Total: 146 mg/dL (ref 100–199)
HDL: 72 mg/dL (ref >39)
LDL Chol Calc (NIH): 61 mg/dL (ref 0–99)
Triglycerides: 64 mg/dL (ref 0–149)
VLDL Cholesterol Cal: 13 mg/dL (ref 5–40)

## 2021-09-19 LAB — MICROALBUMIN / CREATININE URINE RATIO
Creatinine, Urine: 130.6 mg/dL
Microalb/Creat Ratio: 13 mg/g creat (ref 0–29)
Microalbumin, Urine: 17.1 ug/mL

## 2021-09-19 LAB — TSH: TSH: 0.734 u[IU]/mL (ref 0.450–4.500)

## 2021-09-19 NOTE — Addendum Note (Signed)
Addended by: Erlinda Hong T on: 09/19/2021 08:57 AM   Modules accepted: Level of Service

## 2021-09-19 NOTE — Progress Notes (Signed)
Internal Medicine Attending:   I saw and examined the patient. I reviewed the resident's note and I agree with the resident's findings and plan as documented in the resident's note.  

## 2021-09-23 NOTE — Progress Notes (Signed)
Cardiology Office Note  Date:  09/24/2021   ID:  Christina Bryan, DOB 1966-10-28, MRN [id number]  PCP:  Adron Bene, MD   Chief Complaint  Patient presents with   Self Referral     Establish care for carotid artery disease. Patient c/o frequent intense headaches that come on at anytime and may last 5 minutes. Medications reviewed by the patient verbally.      HPI:  Christina Bryan is a 55 year old woman with past medical history of Type 1 diabetes, amputations of toes on right foot after infection of nonhealing ulcer gastroparesis Chronic orthostasis, midodrine as needed Hyperlipidemia Post ablative hypothyroidism Smoker CEA on right 12/2017 Orthostatic hypotension Who presents by referral from Dr. Burnice Logan for consultation of her PAD, hyperlipidemia, cardiac work-up  Reports that she was seen by cardiology in [place], they were scheduled to perform a stress test but she had to leave the area and come to [facility] to help take care of her parents Stress test being performed for anginal symptoms, severe PAD, carotid endarterectomy, high risk features with diabetes type 1  Reports having frequent headaches Lasting 2-4 min, sometimes 4-5 a day "Balloon in head about to burst"  Lab work reviewed A1c 8.5  Carotid u/s in [place]: july 2022 Records requested  Echocardiogram reviewed, performed August 2022  normal ejection fraction, no valve disease, normal pressures  EKG personally reviewed by myself on todays visit Shows normal sinus rhythm rate 69 bpm no significant ST-T wave changes  PMH:   has a past medical history of Diabetes mellitus without complication (HCC) and Thyroid disease.  PSH:    Past Surgical History:  Procedure Laterality Date   BACK SURGERY     CAROTID ENDARTERECTOMY     FOOT AMPUTATION      Current Outpatient Medications  Medication Sig Dispense Refill   atorvastatin (LIPITOR) 20 MG tablet atorvastatin 20 mg tablet     B Complex Vitamins (B  COMPLEX 1 PO) B Complex     insulin lispro (HUMALOG) 100 UNIT/ML injection Humalog U-100 Insulin 100 unit/mL subcutaneous solution  INJECT UP TO 50 UNITS UNDER THE SKIN DAILY WITH INSULIN PUMP     levothyroxine (SYNTHROID) 100 MCG tablet levothyroxine 100 mcg tablet  take 1 tab po QD     lidocaine (LIDODERM) 5 % lidocaine 5 % topical patch     metoprolol tartrate (LOPRESSOR) 50 MG tablet Take 1 tablet (50 mg total) by mouth once for 1 dose. Take 2 hours before your CCTA procedure 1 tablet 0   midodrine (PROAMATINE) 5 MG tablet Take by mouth.     Omega-3 Fatty Acids (FISH OIL) 1000 MG CAPS Fish Oil     omeprazole (PRILOSEC) 40 MG capsule omeprazole 40 mg capsule,delayed release     acyclovir (ZOVIRAX) 400 MG tablet acyclovir 400 mg tablet  TK 1 T PO  BID (Patient not taking: Reported on 09/24/2021)     cefdinir (OMNICEF) 300 MG capsule Take 300 mg by mouth 2 (two) times daily. (Patient not taking: Reported on 09/24/2021)     celecoxib (CELEBREX) 200 MG capsule celecoxib 200 mg capsule (Patient not taking: Reported on 09/24/2021)     cephALEXin (KEFLEX) 500 MG capsule Take 1 capsule (500 mg total) by mouth 3 (three) times daily. (Patient not taking: Reported on 09/24/2021) 30 capsule 0   doxycycline (VIBRAMYCIN) 100 MG capsule Take 1 capsule (100 mg total) by mouth 2 (two) times daily. (Patient not taking: Reported on 09/24/2021) 20 capsule 0   fluticasone (FLONASE)  50 MCG/ACT nasal spray Place 1 spray into both nostrils 2 (two) times daily. (Patient not taking: Reported on 09/24/2021) 16 g 2   omeprazole (PRILOSEC) 40 MG capsule Take 40 mg by mouth daily. (Patient not taking: Reported on 09/24/2021)     No current facility-administered medications for this visit.    Allergies:   Cranberry   Social History:  The patient  reports that she quit smoking about 2 years ago. Her smoking use included cigarettes. She has a 25.00 pack-year smoking history. She has never used smokeless tobacco. She reports  current drug use. Drug: Marijuana. She reports that she does not drink alcohol.   Family History:   family history includes CAD in her father; Dementia in her mother.    Review of Systems: Review of Systems  Constitutional: Negative.   HENT: Negative.    Respiratory: Negative.    Cardiovascular: Negative.   Gastrointestinal: Negative.   Musculoskeletal: Negative.   Neurological: Negative.   Psychiatric/Behavioral: Negative.    All other systems reviewed and are negative.   PHYSICAL EXAM: VS:  BP (!) 98/52 (BP Location: Right Arm, Patient Position: Sitting, Cuff Size: Normal)   Pulse 69   Ht 5' 7.5" (1.715 m)   Wt 167 lb 4 oz (75.9 kg)   SpO2 98%   BMI 25.81 kg/m  , BMI Body mass index is 25.81 kg/m. GEN: Well nourished, well developed, in no acute distress HEENT: normal Neck: no JVD, carotid bruits, or masses Cardiac: RRR; no murmurs, rubs, or gallops,no edema  Respiratory:  clear to auscultation bilaterally, normal work of breathing GI: soft, nontender, nondistended, + BS MS: no deformity or atrophy, brace on her right foot Skin: warm and dry, no rash Neuro:  Strength and sensation are intact Psych: euthymic mood, full affect   Recent Labs: 08/29/2021: ALT 14; BUN 12; Creatinine, Ser 1.06; Hemoglobin 10.7; Platelets 252; Potassium 4.1; Sodium 136 09/18/2021: TSH 0.734    Lipid Panel Lab Results  Component Value Date   CHOL 146 09/18/2021   HDL 72 09/18/2021   LDLCALC 61 09/18/2021   TRIG 64 09/18/2021      Wt Readings from Last 3 Encounters:  09/24/21 167 lb 4 oz (75.9 kg)  09/18/21 169 lb 3.2 oz (76.7 kg)       ASSESSMENT AND PLAN:  Problem List Items Addressed This Visit   None Visit Diagnoses     Angina pectoris (HCC)    -  Primary   Relevant Medications   metoprolol tartrate (LOPRESSOR) 50 MG tablet   Other Relevant Orders   EKG 12-Lead   CT CORONARY MORPH W/CTA COR W/SCORE W/CA W/CM &/OR WO/CM   PAD (peripheral artery disease) (HCC)        Relevant Medications   metoprolol tartrate (LOPRESSOR) 50 MG tablet   Other Relevant Orders   EKG 12-Lead   CT CORONARY MORPH W/CTA COR W/SCORE W/CA W/CM &/OR WO/CM   Type 1 diabetes mellitus with complications (HCC)       Relevant Orders   CT CORONARY MORPH W/CTA COR W/SCORE W/CA W/CM &/OR WO/CM      Peripheral arterial disease History of carotid endarterectomy on the right We have requested carotid ultrasound from her cardiologist in [place] LDL at goal, Discussed that new guidelines for pushing LDL lower, could consider higher dose Lipitor, addition of Zetia and follow-up  Angina Cardiac CTA ordered, discussed benefits of CTA versus Myoview High risk given PAD, prior carotid endarterectomy For any worsening symptoms recommend she  call our office  Diabetes type 1 with complications A1c elevated, she is working to establish care with endocrinology locally  Hyperlipidemia Continue Lipitor 20 for now, Could consider increasing the dose, adding Zetia in future  Headaches Etiology unclear, if symptoms persist may need head MRA At least could consider consultation with neurology    Total encounter time more than 60 minutes  Greater than 50% was spent in counseling and coordination of care with the patient  Patient was seen in consultation for Dr. Burnice Logan will be referred back to his office for ongoing care of the issues detailed above  Signed, Dossie Arbour, M.D., Ph.D. [facility] [place], [place] [phone]

## 2021-09-24 ENCOUNTER — Ambulatory Visit (INDEPENDENT_AMBULATORY_CARE_PROVIDER_SITE_OTHER): Payer: Medicare Other | Admitting: Cardiovascular Disease

## 2021-09-24 ENCOUNTER — Encounter: Payer: Self-pay | Admitting: Cardiovascular Disease

## 2021-09-24 ENCOUNTER — Other Ambulatory Visit: Payer: Self-pay

## 2021-09-24 VITALS — BP 98/52 | HR 69 | Ht 67.5 in | Wt 167.2 lb

## 2021-09-24 DIAGNOSIS — E108 Type 1 diabetes mellitus with unspecified complications: Secondary | ICD-10-CM

## 2021-09-24 DIAGNOSIS — R519 Headache, unspecified: Secondary | ICD-10-CM

## 2021-09-24 DIAGNOSIS — E782 Mixed hyperlipidemia: Secondary | ICD-10-CM

## 2021-09-24 DIAGNOSIS — I739 Peripheral vascular disease, unspecified: Secondary | ICD-10-CM | POA: Diagnosis not present

## 2021-09-24 DIAGNOSIS — I209 Angina pectoris, unspecified: Secondary | ICD-10-CM | POA: Diagnosis not present

## 2021-09-24 DIAGNOSIS — G8929 Other chronic pain: Secondary | ICD-10-CM

## 2021-09-24 MED ORDER — METOPROLOL TARTRATE 50 MG PO TABS: 50.0000 mg | ORAL_TABLET | Freq: Once | ORAL | 0 refills | Status: DC

## 2021-09-24 NOTE — Patient Instructions (Addendum)
Medication Instructions:  No changes  If you need a refill on your cardiac medications before your next appointment, please call your pharmacy.    Lab work: Not needed  Testing/Procedures: Cardiac CTA   Follow-Up: At [facility], you and your health needs are our priority.  As part of our continuing mission to provide you with exceptional heart care, we have created designated Provider Care Teams.  These Care Teams include your primary Cardiologist (physician) and Advanced Practice Providers (APPs -  Physician Assistants and Nurse Practitioners) who all work together to provide you with the care you need, when you need it.  You will need a follow up appointment as needed  Providers on your designated Care Team:   Nicolasa Ducking, NP Eula Listen, PA-C Marisue Ivan, PA-C Cadence Washington, [place]  COVID-19 Vaccine Information can be found at: [email or web address] For questions related to vaccine distribution or appointments, please email [email or web address] or call [phone].   Cardiac (coronary) CTA   [facility] [address] [place], [place] [address] [phone]  Date: 09/27/2021   Time: 01:30 pm, arrive at 1:15 pm Please arrive 15 mins early for check-in and test prep.  Please follow these instructions carefully:   On the Night Before the Test: Be sure to Drink plenty of water. Do not consume any caffeinated/decaffeinated beverages or chocolate 12 hours prior to your test. This includes decaf as well Do not take any antihistamines 12 hours prior to your test. Such as Benadryl  On the Day of the Test: Drink plenty of water until 1 hour prior to the test. Do not eat any food 4 hours prior to the test. You may take your regular medications prior to the test.  Take metoprolol (Lopressor) two hours prior to test. Lopressor 50 mg This was sent in to your  pharmacy for pick-up HOLD Furosemide/Hydrochlorothiazide morning of the test. No fluid pills FEMALES- please wear underwire-free bra if available       After the Test: Drink plenty of water. After receiving IV contrast, you may experience a mild flushed feeling. This is normal. On occasion, you may experience a mild rash up to 24 hours after the test. This is not dangerous. If this occurs, you can take Benadryl 25 mg and increase your fluid intake (to flush out the kidneys) If you experience trouble breathing, this can be serious. If it is severe call 911 IMMEDIATELY. If it is mild, please call our office. If you take any of these medications:  Glipizide/Metformin Avandament,  Glucavance,  please do not take 48 hours after completing test unless otherwise instructed.   Once we have confirmed authorization from your insurance company, we will call you to set up a date and time for your test. Based on how quickly your insurance processes prior authorizations requests, please allow up to 4 weeks to be contacted for scheduling your Cardiac CT appointment. Be advised that routine Cardiac CT appointments could be scheduled as many as 8 weeks after your provider has ordered it.   For scheduling needs, including cancellations and rescheduling, please call [place], [phone].

## 2021-09-26 ENCOUNTER — Telehealth (HOSPITAL_COMMUNITY): Payer: Self-pay | Admitting: *Deleted

## 2021-09-26 NOTE — Telephone Encounter (Signed)
Reaching out to patient to offer assistance regarding upcoming cardiac imaging study; pt verbalizes understanding of appt date/time, parking situation and where to check in, pre-test NPO status and medications ordered, and verified current allergies; name and call back number provided for further questions should they arise  Daylynn Stumpp RN Navigator Cardiac Imaging [facility] [phone] office [phone] cell  Patient to take 50mg metoprolol tartrate two hours prior to cardiac CT scan. 

## 2021-09-27 ENCOUNTER — Ambulatory Visit
Admission: RE | Admit: 2021-09-27 | Discharge: 2021-09-27 | Disposition: A | Discharge status: Home / Self Care | Payer: Medicare Other | Source: Ambulatory Visit | Attending: Cardiovascular Disease | Admitting: Cardiovascular Disease

## 2021-09-27 ENCOUNTER — Ambulatory Visit: Admission: RE | Admit: 2021-09-27 | Payer: Medicare Other | Source: Ambulatory Visit

## 2021-09-27 ENCOUNTER — Other Ambulatory Visit: Payer: Self-pay

## 2021-09-27 ENCOUNTER — Ambulatory Visit: Payer: Medicare Other | Admitting: Sports Medicine

## 2021-09-27 ENCOUNTER — Other Ambulatory Visit: Payer: Self-pay | Admitting: Cardiovascular Disease

## 2021-09-27 DIAGNOSIS — I2510 Atherosclerotic heart disease of native coronary artery without angina pectoris: Secondary | ICD-10-CM | POA: Diagnosis not present

## 2021-09-27 DIAGNOSIS — R931 Abnormal findings on diagnostic imaging of heart and coronary circulation: Secondary | ICD-10-CM | POA: Diagnosis not present

## 2021-09-27 DIAGNOSIS — I209 Angina pectoris, unspecified: Secondary | ICD-10-CM | POA: Diagnosis present

## 2021-09-27 DIAGNOSIS — I739 Peripheral vascular disease, unspecified: Secondary | ICD-10-CM | POA: Diagnosis present

## 2021-09-27 DIAGNOSIS — E108 Type 1 diabetes mellitus with unspecified complications: Secondary | ICD-10-CM | POA: Diagnosis present

## 2021-09-27 MED ORDER — DILTIAZEM HCL 25 MG/5ML IV SOLN
10.0000 mg | Freq: Once | INTRAVENOUS | Status: AC
  Administered 2021-09-27: 10 mg via INTRAVENOUS

## 2021-09-27 MED ORDER — NITROGLYCERIN 0.4 MG SL SUBL
0.8000 mg | SUBLINGUAL_TABLET | Freq: Once | SUBLINGUAL | Status: AC
  Administered 2021-09-27: 0.8 mg via SUBLINGUAL

## 2021-09-27 MED ORDER — IOHEXOL 350 MG/ML SOLN
75.0000 mL | Freq: Once | INTRAVENOUS | Status: AC | PRN
  Administered 2021-09-27: 75 mL via INTRAVENOUS

## 2021-09-27 NOTE — Progress Notes (Signed)
Patient tolerated CT well. Drank water after. Vital signs stable encourage to drink water throughout day.Reasons explained and verbalized understanding.  

## 2021-10-04 ENCOUNTER — Other Ambulatory Visit: Payer: Self-pay

## 2021-10-04 ENCOUNTER — Ambulatory Visit (INDEPENDENT_AMBULATORY_CARE_PROVIDER_SITE_OTHER): Payer: Medicare Other | Admitting: Sports Medicine

## 2021-10-04 ENCOUNTER — Encounter: Payer: Self-pay | Admitting: Sports Medicine

## 2021-10-04 DIAGNOSIS — L97511 Non-pressure chronic ulcer of other part of right foot limited to breakdown of skin: Secondary | ICD-10-CM | POA: Diagnosis not present

## 2021-10-04 DIAGNOSIS — Z89421 Acquired absence of other right toe(s): Secondary | ICD-10-CM

## 2021-10-04 DIAGNOSIS — M79671 Pain in right foot: Secondary | ICD-10-CM

## 2021-10-04 DIAGNOSIS — E1010 Type 1 diabetes mellitus with ketoacidosis without coma: Secondary | ICD-10-CM

## 2021-10-04 NOTE — Progress Notes (Signed)
Subjective: Christina Bryan is a 55 y.o. type I diabetic female patient seen in office for follow-up evaluation of right foot wound secondary to abscess/blister.  Patient reports that she is doing good but forgot her appointment on last week.  Patient reports that she has been using cam boot and changing her dressing daily as previously directed denies nausea vomiting fever chills or any constitutional symptoms at this time.    Fasting blood sugar not documented this visit.  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   Type 1 diabetes mellitus (HCC) 12/14/2017   Current Outpatient Medications on File Prior to Visit  Medication Sig Dispense Refill   atorvastatin (LIPITOR) 20 MG tablet atorvastatin 20 mg tablet     B Complex Vitamins (B COMPLEX 1 PO) B Complex     insulin lispro (HUMALOG) 100 UNIT/ML injection Humalog U-100 Insulin 100 unit/mL subcutaneous solution  INJECT UP TO 50 UNITS UNDER THE SKIN DAILY WITH INSULIN PUMP     levothyroxine (SYNTHROID) 100 MCG tablet levothyroxine 100 mcg tablet  take 1 tab po QD     lidocaine (LIDODERM) 5 % lidocaine 5 % topical patch     midodrine (PROAMATINE) 5 MG tablet Take by mouth.     Omega-3 Fatty Acids (FISH OIL) 1000 MG CAPS Fish Oil     omeprazole (PRILOSEC) 40 MG capsule omeprazole 40 mg capsule,delayed release     acyclovir (ZOVIRAX) 400 MG tablet acyclovir 400 mg tablet  TK 1 T PO  BID (Patient not taking: Reported on 09/24/2021)     cefdinir (OMNICEF)  300 MG capsule Take 300 mg by mouth 2 (two) times daily. (Patient not taking: Reported on 09/24/2021)     celecoxib (CELEBREX) 200 MG capsule celecoxib 200 mg capsule (Patient not taking: Reported on 09/24/2021)     cephALEXin (KEFLEX) 500 MG capsule Take 1 capsule (500 mg total) by mouth 3 (three) times daily. (Patient not taking: Reported on 09/24/2021) 30 capsule 0   doxycycline (VIBRAMYCIN) 100 MG capsule Take 1 capsule (100 mg total) by mouth 2 (two) times daily. (Patient not taking: Reported on 09/24/2021) 20 capsule 0   fluticasone (FLONASE) 50 MCG/ACT nasal spray Place 1 spray into both nostrils 2 (two) times daily. (Patient not taking: Reported on 09/24/2021) 16 g 2   metoprolol tartrate (LOPRESSOR) 50 MG tablet Take 1 tablet (50 mg total) by mouth once for 1 dose. Take 2 hours before your CCTA procedure 1 tablet 0   omeprazole (PRILOSEC) 40 MG capsule Take 40 mg by mouth daily. (Patient not taking: Reported on 09/24/2021)     No current facility-administered medications on file prior to visit.   Allergies  Allergen Reactions   Cranberry Hives    Recent Results (from the past 2160 hour(s))  Comprehensive metabolic panel     Status: Abnormal   Collection Time: 08/16/21  9:49 AM  Result Value Ref Range   Sodium 133 (L) 135 - 145 mmol/L   Potassium 5.1 3.5 - 5.1 mmol/L   Chloride 100 98 -  111 mmol/L   CO2 23 22 - 32 mmol/L   Glucose, Bld 307 (H) 70 - 99 mg/dL    Comment: Glucose reference range applies only to samples taken after fasting for at least 8 hours.   BUN 16 6 - 20 mg/dL   Creatinine, Ser 2.70 (H) 0.44 - 1.00 mg/dL   Calcium 9.5 8.9 - 62.3 mg/dL   Total Protein 7.4 6.5 - 8.1 g/dL   Albumin 3.5 3.5 - 5.0 g/dL   AST 17 15 - 41 U/L   ALT 15 0 - 44 U/L   Alkaline Phosphatase 79 38 - 126 U/L   Total Bilirubin 0.3 0.3 - 1.2 mg/dL   GFR, Estimated 56 (L) >60 mL/min    Comment: (NOTE) Calculated using the CKD-EPI Creatinine Equation (2021)    Anion gap 10 5 - 15    Comment:  Performed at [facility], [address], [place], [place] [address]  Comprehensive metabolic panel     Status: Abnormal   Collection Time: 08/29/21  1:17 PM  Result Value Ref Range   Sodium 136 135 - 145 mmol/L   Potassium 4.1 3.5 - 5.1 mmol/L   Chloride 102 98 - 111 mmol/L   CO2 28 22 - 32 mmol/L   Glucose, Bld 161 (H) 70 - 99 mg/dL    Comment: Glucose reference range applies only to samples taken after fasting for at least 8 hours.   BUN 12 6 - 20 mg/dL   Creatinine, Ser 1.51 (H) 0.44 - 1.00 mg/dL   Calcium 9.9 8.9 - 76.1 mg/dL   Total Protein 7.1 6.5 - 8.1 g/dL   Albumin 3.6 3.5 - 5.0 g/dL   AST 16 15 - 41 U/L   ALT 14 0 - 44 U/L   Alkaline Phosphatase 92 38 - 126 U/L   Total Bilirubin 1.2 0.3 - 1.2 mg/dL   GFR, Estimated >60 >73 mL/min    Comment: (NOTE) Calculated using the CKD-EPI Creatinine Equation (2021)    Anion gap 6 5 - 15    Comment: Performed at [facility], [address], [place], [place] [address]  CBC with Differential     Status: Abnormal   Collection Time: 08/29/21  1:17 PM  Result Value Ref Range   WBC 9.6 4.0 - 10.5 K/uL   RBC 3.51 (L) 3.87 - 5.11 MIL/uL   Hemoglobin 10.7 (L) 12.0 - 15.0 g/dL   HCT 69.4 (L) 85.4 - 62.7 %   MCV 92.6 80.0 - 100.0 fL   MCH 30.5 26.0 - 34.0 pg   MCHC 32.9 30.0 - 36.0 g/dL   RDW 03.5 00.9 - 38.1 %   Platelets 252 150 - 400 K/uL   nRBC 0.0 0.0 - 0.2 %   Neutrophils Relative % 71 %   Neutro Abs 6.9 1.7 - 7.7 K/uL   Lymphocytes Relative 18 %   Lymphs Abs 1.7 0.7 - 4.0 K/uL   Monocytes Relative 9 %   Monocytes Absolute 0.8 0.1 - 1.0 K/uL   Eosinophils Relative 1 %   Eosinophils Absolute 0.1 0.0 - 0.5 K/uL   Basophils Relative 1 %   Basophils Absolute 0.1 0.0 - 0.1 K/uL   Immature Granulocytes 0 %   Abs Immature Granulocytes 0.02 0.00 - 0.07 K/uL    Comment: Performed at [facility], [address] [place], [place] [address]  Sedimentation rate     Status: Abnormal   Collection Time: 08/29/21  1:17 PM  Result Value Ref Range   Sed Rate 50 (H) 0 - 22 mm/hr    Comment: Performed at [facility], [address], [place], [place] [address]  WOUND CULTURE     Status: Abnormal   Collection Time: 09/06/21  5:05 PM   Specimen: Abscess; Wound  Result Value Ref Range   MICRO NUMBER: [id number]    SPECIMEN QUALITY: Adequate    SOURCE: RIGHT FOOT    STATUS: FINAL    GRAM STAIN:      No white blood cells seen Few epithelial cells Moderate Gram positive bacilli Few Gram positive cocci in clusters Few Gram negative bacilli   ISOLATE 1: Serratia marcescens (A)     Comment: Light growth of Serratia marcescens      Susceptibility   Serratia marcescens - AEROBIC CULT, GRAM STAIN NEGATIVE 1    AMOX/CLAVULANIC 16 Resistant     CEFAZOLIN* >=64 Resistant      * For infections other than uncomplicated UTI caused by E. coli, K. pneumoniae or P. mirabilis: Cefazolin is resistant if MIC > or = 8 mcg/mL. (Distinguishing susceptible versus intermediate for isolates with MIC < or = 4 mcg/mL requires additional testing.)     CEFTAZIDIME <=1 Sensitive     CEFEPIME <=1 Sensitive     CEFTRIAXONE <=1 Sensitive     CIPROFLOXACIN <=0.25 Sensitive     LEVOFLOXACIN <=0.12 Sensitive     GENTAMICIN <=1 Sensitive     TOBRAMYCIN 4 Sensitive     TRIMETH/SULFA* <=20 Sensitive      * For infections other than uncomplicated UTI caused by E. coli, K. pneumoniae or P. mirabilis: Cefazolin is resistant if MIC > or = 8 mcg/mL. (Distinguishing susceptible versus intermediate for isolates with MIC < or = 4 mcg/mL requires additional testing.) Legend: S = Susceptible  I = Intermediate R = Resistant  NS = Not susceptible * = Not tested  NR = Not reported **NN = See antimicrobic comments   Microalbumin / Creatinine Urine Ratio     Status: None   Collection Time: 09/18/21  4:15 PM  Result Value Ref Range   Creatinine, Urine 130.6 Not Estab. mg/dL   Microalbumin, Urine 22.0 Not Estab. ug/mL   Microalb/Creat  Ratio 13 0 - 29 mg/g creat    Comment:                        Normal:                0 -  29                        Moderately increased: 30 - 300                        Severely increased:       >300   Lipid Profile     Status: None   Collection Time: 09/18/21  4:17 PM  Result Value Ref Range   Cholesterol, Total 146 100 - 199 mg/dL   Triglycerides 64 0 - 149 mg/dL   HDL 72 >25 mg/dL   VLDL Cholesterol Cal 13 5 - 40 mg/dL   LDL Chol Calc (NIH) 61 0 - 99 mg/dL   Chol/HDL Ratio 2.0 0.0 - 4.4 ratio    Comment:  T. Chol/HDL Ratio                                             Men  Women                               1/2 Avg.Risk  3.4    3.3                                   Avg.Risk  5.0    4.4                                2X Avg.Risk  9.6    7.1                                3X Avg.Risk 23.4   11.0   TSH     Status: None   Collection Time: 09/18/21  4:17 PM  Result Value Ref Range   TSH 0.734 0.450 - 4.500 uIU/mL  Glucose, capillary     Status: Abnormal   Collection Time: 09/18/21  4:26 PM  Result Value Ref Range   Glucose-Capillary 152 (H) 70 - 99 mg/dL    Comment: Glucose reference range applies only to samples taken after fasting for at least 8 hours.  POC Hbg A1C     Status: Abnormal   Collection Time: 09/18/21  4:33 PM  Result Value Ref Range   Hemoglobin A1C 8.5 (A) 4.0 - 5.6 %   HbA1c POC (<> result, manual entry)     HbA1c, POC (prediabetic range)     HbA1c, POC (controlled diabetic range)      Objective: There were no vitals filed for this visit.  General: Patient is awake, alert, oriented x 3 and in no acute distress.  Dermatology: Skin is warm and dry bilateral with a partial thickness wound plantar right foot that measures 1.1x0.9 cm, 50% improved since last encounter, with a granular base there is small area of keratotic margin periwound with maceration noted to the sulcus area underneath the toes of the right foot with peeling  blistered skin, minimal edema, resolved erythema, no malodor, no active drainage.  No other acute signs of infection.   Vascular: Dorsalis Pedis pulse = 1/4 Bilateral,  Posterior Tibial pulse = 1/4 Bilateral,  Capillary Fill Time < 5 seconds   Neurologic: Protective sensation diminished bilateral.   Musculosketal: No pain to palpation to ulcer/abscess area plantar right foot.  Status post right fifth toe amputation.  Hammertoe deformity noted on the left foot.   No results for input(s): GRAMSTAIN, LABORGA in the last 8760 hours.  Assessment and Plan:  Problem List Items Addressed This Visit       Endocrine   Type 1 diabetes mellitus (HCC)   Other Visit Diagnoses     Right foot ulcer, limited to breakdown of skin (HCC)    -  Primary   History of amputation of lesser toe of right foot (HCC)       Pain in right foot            -Examined patient and discussed  the progression of the wound and treatment alternatives. -Excisionally dedbrided ulceration at right plantar forefoot to healthy bleeding borders removing nonviable tissue using a 15 blade.  Wound measures post debridement as above. Wound was debrided to the level of the dermis with viable wound base exposed to promote healing. Hemostasis was achieved with manuel pressure. Patient tolerated procedure well without any discomfort or anesthesia necessary for this wound debridement.  -Applied Betadine to the perimeter and Medihoney to the central wound and oval shape offloading padding covered with dry sterile dressing and instructed patient to continue with daily dressings at home consisting of same daily as directed -Continue with cam boot at all times to reduce plantar pressure on the right foot - Advised patient to go to the ER or return to office if the wound worsens or if constitutional symptoms are present. -Patient to return to office in 2 weeks for follow up wound care  or sooner if problems arise.  Asencion Islam, DPM

## 2021-10-10 ENCOUNTER — Ambulatory Visit: Payer: Medicare Other | Admitting: Cardiovascular Disease

## 2021-10-10 NOTE — Progress Notes (Deleted)
Cardiology Office Note  Date:  10/10/2021   ID:  Christina Bryan, DOB 1966-12-11, MRN [id number]  PCP:  Adron Bene, MD   No chief complaint on file.   HPI:  Christina Bryan is a 55 year old woman with past medical history of Type 1 diabetes, amputations of toes on right foot after infection of nonhealing ulcer gastroparesis Chronic orthostasis, midodrine as needed Hyperlipidemia Post ablative hypothyroidism Smoker CEA on right 12/2017 Orthostatic hypotension Who presents for follow-up of her PAD, hyperlipidemia, review of her cardiac CTA  On last clinic visit cardiac CTA ordered for anginal symptoms Other high risk features diabetes type 1, severe PAD, history of carotid endarterectomy Results discussed Calcium score 2343 ---CT FFR analysis showed significant stenosis in the mid LAD. 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 Moderate disease in left circumflex    Reports that she was seen by cardiology in [place], they were scheduled to perform a stress test but she had to leave the area and come to [facility] [place] to help take care of her parents Stress test being performed for anginal symptoms, severe PAD, carotid endarterectomy, high risk features with diabetes type 1  Reports having frequent headaches Lasting 2-4 min, sometimes 4-5 a day "Balloon in head about to burst"  Lab work reviewed A1c 8.5  Carotid u/s in [place]: july 2022 Records requested  Echocardiogram reviewed, performed August 2022  normal ejection fraction, no valve disease, normal pressures  EKG personally reviewed by myself on todays visit Shows normal sinus rhythm rate 69 bpm no significant ST-T wave changes  PMH:   has a past medical history of Diabetes mellitus without complication (HCC) and Thyroid disease.  PSH:    Past Surgical History:  Procedure Laterality Date   BACK SURGERY     CAROTID ENDARTERECTOMY     FOOT AMPUTATION      Current Outpatient Medications  Medication Sig Dispense  Refill   acyclovir (ZOVIRAX) 400 MG tablet acyclovir 400 mg tablet  TK 1 T PO  BID (Patient not taking: Reported on 09/24/2021)     atorvastatin (LIPITOR) 20 MG tablet atorvastatin 20 mg tablet     B Complex Vitamins (B COMPLEX 1 PO) B Complex     cefdinir (OMNICEF) 300 MG capsule Take 300 mg by mouth 2 (two) times daily. (Patient not taking: Reported on 09/24/2021)     celecoxib (CELEBREX) 200 MG capsule celecoxib 200 mg capsule (Patient not taking: Reported on 09/24/2021)     cephALEXin (KEFLEX) 500 MG capsule Take 1 capsule (500 mg total) by mouth 3 (three) times daily. (Patient not taking: Reported on 09/24/2021) 30 capsule 0   doxycycline (VIBRAMYCIN) 100 MG capsule Take 1 capsule (100 mg total) by mouth 2 (two) times daily. (Patient not taking: Reported on 09/24/2021) 20 capsule 0   fluticasone (FLONASE) 50 MCG/ACT nasal spray Place 1 spray into both nostrils 2 (two) times daily. (Patient not taking: Reported on 09/24/2021) 16 g 2   insulin lispro (HUMALOG) 100 UNIT/ML injection Humalog U-100 Insulin 100 unit/mL subcutaneous solution  INJECT UP TO 50 UNITS UNDER THE SKIN DAILY WITH INSULIN PUMP     levothyroxine (SYNTHROID) 100 MCG tablet levothyroxine 100 mcg tablet  take 1 tab po QD     lidocaine (LIDODERM) 5 % lidocaine 5 % topical patch     metoprolol tartrate (LOPRESSOR) 50 MG tablet Take 1 tablet (50 mg total) by mouth once for 1 dose. Take 2 hours before your CCTA procedure 1 tablet 0   midodrine (PROAMATINE) 5 MG  tablet Take by mouth.     Omega-3 Fatty Acids (FISH OIL) 1000 MG CAPS Fish Oil     omeprazole (PRILOSEC) 40 MG capsule Take 40 mg by mouth daily. (Patient not taking: Reported on 09/24/2021)     omeprazole (PRILOSEC) 40 MG capsule omeprazole 40 mg capsule,delayed release     No current facility-administered medications for this visit.    Allergies:   Cranberry   Social History:  The patient  reports that she quit smoking about 2 years ago. Her smoking use included  cigarettes. She has a 25.00 pack-year smoking history. She has never used smokeless tobacco. She reports current drug use. Drug: Marijuana. She reports that she does not drink alcohol.   Family History:   family history includes CAD in her father; Dementia in her mother.    Review of Systems: Review of Systems  Constitutional: Negative.   HENT: Negative.    Respiratory: Negative.    Cardiovascular: Negative.   Gastrointestinal: Negative.   Musculoskeletal: Negative.   Neurological: Negative.   Psychiatric/Behavioral: Negative.    All other systems reviewed and are negative.   PHYSICAL EXAM: VS:  There were no vitals taken for this visit. , BMI There is no height or weight on file to calculate BMI. GEN: Well nourished, well developed, in no acute distress HEENT: normal Neck: no JVD, carotid bruits, or masses Cardiac: RRR; no murmurs, rubs, or gallops,no edema  Respiratory:  clear to auscultation bilaterally, normal work of breathing GI: soft, nontender, nondistended, + BS MS: no deformity or atrophy, brace on her right foot Skin: warm and dry, no rash Neuro:  Strength and sensation are intact Psych: euthymic mood, full affect   Recent Labs: 08/29/2021: ALT 14; BUN 12; Creatinine, Ser 1.06; Hemoglobin 10.7; Platelets 252; Potassium 4.1; Sodium 136 09/18/2021: TSH 0.734    Lipid Panel Lab Results  Component Value Date   CHOL 146 09/18/2021   HDL 72 09/18/2021   LDLCALC 61 09/18/2021   TRIG 64 09/18/2021      Wt Readings from Last 3 Encounters:  09/24/21 167 lb 4 oz (75.9 kg)  09/18/21 169 lb 3.2 oz (76.7 kg)       ASSESSMENT AND PLAN:  Problem List Items Addressed This Visit   None Peripheral arterial disease History of carotid endarterectomy on the right We have requested carotid ultrasound from her cardiologist in [place] LDL at goal, Discussed that new guidelines for pushing LDL lower, could consider higher dose Lipitor, addition of Zetia and  follow-up  Angina Cardiac CTA ordered, discussed benefits of CTA versus Myoview High risk given PAD, prior carotid endarterectomy For any worsening symptoms recommend she call our office  Diabetes type 1 with complications A1c elevated, she is working to establish care with endocrinology locally  Hyperlipidemia Continue Lipitor 20 for now, Could consider increasing the dose, adding Zetia in future  Headaches Etiology unclear, if symptoms persist may need head MRA At least could consider consultation with neurology    Total encounter time more than 60 minutes  Greater than 50% was spent in counseling and coordination of care with the patient  Patient was seen in consultation for Dr. Burnice Logan will be referred back to his office for ongoing care of the issues detailed above  Signed, Dossie Arbour, M.D., Ph.D. [facility] [place], [place] [phone]

## 2021-10-15 ENCOUNTER — Encounter: Payer: Self-pay | Admitting: Cardiovascular Disease

## 2021-10-15 ENCOUNTER — Ambulatory Visit (INDEPENDENT_AMBULATORY_CARE_PROVIDER_SITE_OTHER): Payer: Medicare Other | Admitting: Cardiovascular Disease

## 2021-10-15 ENCOUNTER — Other Ambulatory Visit: Payer: Self-pay

## 2021-10-15 VITALS — BP 100/60 | HR 62 | Ht 67.0 in | Wt 165.4 lb

## 2021-10-15 DIAGNOSIS — Z01810 Encounter for preprocedural cardiovascular examination: Secondary | ICD-10-CM | POA: Diagnosis not present

## 2021-10-15 DIAGNOSIS — I25118 Atherosclerotic heart disease of native coronary artery with other forms of angina pectoris: Secondary | ICD-10-CM

## 2021-10-15 DIAGNOSIS — I209 Angina pectoris, unspecified: Secondary | ICD-10-CM | POA: Diagnosis not present

## 2021-10-15 DIAGNOSIS — I739 Peripheral vascular disease, unspecified: Secondary | ICD-10-CM | POA: Diagnosis not present

## 2021-10-15 DIAGNOSIS — E108 Type 1 diabetes mellitus with unspecified complications: Secondary | ICD-10-CM

## 2021-10-15 DIAGNOSIS — E782 Mixed hyperlipidemia: Secondary | ICD-10-CM

## 2021-10-15 LAB — GLUCOSE, CAPILLARY

## 2021-10-15 MED ORDER — ASPIRIN EC 81 MG PO TBEC: 81.0000 mg | DELAYED_RELEASE_TABLET | Freq: Every day | ORAL | 3 refills | Status: DC

## 2021-10-15 MED ORDER — MIDODRINE HCL 5 MG PO TABS: 5.0000 mg | ORAL_TABLET | Freq: Every day | ORAL | 3 refills | Status: DC | PRN

## 2021-10-15 MED ORDER — ATORVASTATIN CALCIUM 20 MG PO TABS: ORAL_TABLET | ORAL | 3 refills | Status: DC

## 2021-10-15 NOTE — Progress Notes (Signed)
Cardiology Office Note  Date:  10/15/2021   ID:  Christina Bryan, DOB 02/23/1966, MRN [id number]  PCP:  Gawaluck, [place], MD   Chief Complaint  Patient presents with   Discuss CT and Cardiac Cath    "Doing well." Medications reviewed by the patient verbally.     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 gastroparesis Chronic orthostasis, midodrine as needed Hyperlipidemia Post ablative hypothyroidism Smoker CEA on right 12/2017 Orthostatic hypotension Cardiac CTA September 27, 2021 calcium score 2300, significant stenosis of mid LAD, severe stenosis of RCA, moderate left circumflex disease Who presents for follow-up of her PAD, hyperlipidemia,   Presents for follow-up of her anginal symptoms review of her cardiac CTA Demonstrating severe stenosis mid LAD, proximal to mid RCA Details as below  Reports some shortness of breath, concerning for angina Continues to have fullness in her head, "Balloon in head about to burst"  Cardiac risk factors diabetes type 1, severe PAD, history of carotid endarterectomy  CT results discussed with her in detail Calcium score 2343 ---CT FFR analysis showed significant stenosis in the mid LAD. 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 Moderate disease in left circumflex  Lab work reviewed A1c 8.5  Carotid u/s in [place]: july 2022  Echocardiogram reviewed,from [place] performed August 2022  normal ejection fraction, no valve disease, normal pressures  EKG personally reviewed by myself on todays visit Normal sinus rhythm rate 82 bpm no significant ST-T wave changes  PMH:   has a past medical history of Diabetes mellitus without complication (HCC) and Thyroid disease.  PSH:    Past Surgical History:  Procedure Laterality Date   BACK SURGERY     CAROTID ENDARTERECTOMY     FOOT AMPUTATION      Current Outpatient  Medications  Medication Sig Dispense Refill   aspirin EC 81 MG tablet Take 1 tablet (81 mg total) by mouth daily. Swallow whole. 90 tablet 3   B Complex Vitamins (B COMPLEX 1 PO) B Complex     insulin lispro (HUMALOG) 100 UNIT/ML injection Humalog U-100 Insulin 100 unit/mL subcutaneous solution  INJECT UP TO 50 UNITS UNDER THE SKIN DAILY WITH INSULIN PUMP     levothyroxine (SYNTHROID) 100 MCG tablet levothyroxine 100 mcg tablet  take 1 tab po QD     lidocaine (LIDODERM) 5 % lidocaine 5 % topical patch     Omega-3 Fatty Acids (FISH OIL) 1000 MG CAPS Fish Oil     omeprazole (PRILOSEC) 40 MG capsule omeprazole 40 mg capsule,delayed release     acyclovir (ZOVIRAX) 400 MG tablet acyclovir 400 mg tablet  TK 1 T PO  BID (Patient not taking: No sig reported)     atorvastatin (LIPITOR) 20 MG tablet atorvastatin 20 mg tablet 90 tablet 3   midodrine (PROAMATINE) 5 MG tablet Take 1 tablet (5 mg total) by mouth daily as needed. For low BP 60 tablet 3   No current facility-administered medications for this visit.    Allergies:   Cranberry   Social History:  The patient  reports that she quit smoking about 2 years ago. Her smoking use included cigarettes. She has a 25.00 pack-year smoking history. She has never used smokeless tobacco. She reports current drug use. Drug: Marijuana. She reports that she does not drink alcohol.   Family History:   family history includes CAD in her father; Dementia in her mother.      Review of Systems: Review of Systems  Constitutional: Negative.   HENT: Negative.    Respiratory:  Positive for shortness of breath.   Cardiovascular:  Positive for chest pain.  Gastrointestinal: Negative.   Musculoskeletal: Negative.   Neurological: Negative.        Head fullness  Psychiatric/Behavioral: Negative.    All other systems reviewed and are negative.   PHYSICAL EXAM: VS:  BP 100/60 (BP Location: Left Arm, Patient Position: Sitting, Cuff Size: Normal)   Pulse 62   Ht  5' 7" (1.702 m)   Wt 165 lb 6 oz (75 kg)   SpO2 98%   BMI 25.90 kg/m  , BMI Body mass index is 25.9 kg/m. Constitutional:  oriented to person, place, and time. No distress.  HENT:  Head: Grossly normal Eyes:  no discharge. No scleral icterus.  Neck: No JVD, no carotid bruits  Cardiovascular: Regular rate and rhythm, no murmurs appreciated Pulmonary/Chest: Clear to auscultation bilaterally, no wheezes or rails Abdominal: Soft.  no distension.  no tenderness.  Musculoskeletal: Normal range of motion Neurological:  normal muscle tone. Coordination normal. No atrophy Skin: Skin warm and dry Psychiatric: normal affect, pleasant   Recent Labs: 08/29/2021: ALT 14; BUN 12; Creatinine, Ser 1.06; Hemoglobin 10.7; Platelets 252; Potassium 4.1; Sodium 136 09/18/2021: TSH 0.734    Lipid Panel Lab Results  Component Value Date   CHOL 146 09/18/2021   HDL 72 09/18/2021   LDLCALC 61 09/18/2021   TRIG 64 09/18/2021      Wt Readings from Last 3 Encounters:  10/15/21 165 lb 6 oz (75 kg)  09/24/21 167 lb 4 oz (75.9 kg)  09/18/21 169 lb 3.2 oz (76.7 kg)       ASSESSMENT AND PLAN:  Problem List Items Addressed This Visit       Cardiology Problems   Mixed hyperlipidemia   Relevant Medications   aspirin EC 81 MG tablet   atorvastatin (LIPITOR) 20 MG tablet   midodrine (PROAMATINE) 5 MG tablet   Other Visit Diagnoses     PAD (peripheral artery disease) (HCC)    -  Primary   Relevant Medications   aspirin EC 81 MG tablet   atorvastatin (LIPITOR) 20 MG tablet   midodrine (PROAMATINE) 5 MG tablet   Type 1 diabetes mellitus with complications (HCC)       Relevant Medications   aspirin EC 81 MG tablet   atorvastatin (LIPITOR) 20 MG tablet   Pre-procedural cardiovascular examination       Relevant Orders   Basic metabolic panel   CBC   EKG 12-Lead     Coronary artery disease with stable angina Cardiac CTA results discussed with her, Recommended cardiac catheterization given  shortness of breath, high risk features including PAD, endarterectomy -She lives in [place], would prefer any further work-up to be done there I have reviewed the risks, indications, and alternatives to cardiac catheterization, possible angioplasty, and stenting with the patient. Risks include but are not limited to bleeding, infection, vascular injury, stroke, myocardial infection, arrhythmia, kidney injury, radiation-related injury in the case of prolonged fluoroscopy use, emergency cardiac surgery, and death. The patient understands the risks of serious complication is 1-2 in 1000 with diagnostic cardiac cath and 1-2% or less with angioplasty/stenting.  -- We have recommended cardiac catheterization, this will be scheduled when her husband is back later this week, tentatively scheduled for 2/26 in [place] When she start aspirin 81 mg daily, stay on statin unable to add beta-blocker given hypotension -Anxious   about bruising, long discussion with her.  warfarin should not be needed, which she was concerned about  Peripheral arterial disease History of carotid endarterectomy on the right -Likely benefit from higher dose statin, adding Zetia Discussed with her at the time of her catheterization or in follow-up  Angina Cardiac CTA results discussed, catheterization as above scheduled for next week  Diabetes type 1 with complications A1c elevated, Recommend she follow-up with primary care, may need endocrine  Hyperlipidemia Continue Lipitor 20 for now, numbers at goal Could consider higher dose Lipitor in the future  Headaches Unclear if this is her anginal equivalent, may need MRI if no improvement    Total encounter time more than 25 minutes  Greater than 50% was spent in counseling and coordination of care with the patient  Signed, Dossie Arbour, M.D., Ph.D. [facility] [place], [place] [phone]

## 2021-10-15 NOTE — H&P (View-Only) (Signed)
Cardiology Office Note  Date:  10/15/2021   ID:  [name], DOB 24-Jul-1966, MRN [id number]  PCP:  Adron Bene, MD   Chief Complaint  Patient presents with   Discuss CT and Cardiac Cath    "Doing well." Medications reviewed by the patient verbally.     HPI:  Christina Bryan is a 55 year old woman with past medical history of Type 1 diabetes, amputations of toes on right foot after infection of nonhealing ulcer gastroparesis Chronic orthostasis, midodrine as needed Hyperlipidemia Post ablative hypothyroidism Smoker CEA on right 12/2017 Orthostatic hypotension Cardiac CTA September 27, 2021 calcium score 2300, significant stenosis of mid LAD, severe stenosis of RCA, moderate left circumflex disease Who presents for follow-up of her PAD, hyperlipidemia,   Presents for follow-up of her anginal symptoms review of her cardiac CTA Demonstrating severe stenosis mid LAD, proximal to mid RCA Details as below  Reports some shortness of breath, concerning for angina Continues to have fullness in her head, "Balloon in head about to burst"  Cardiac risk factors diabetes type 1, severe PAD, history of carotid endarterectomy  CT results discussed with her in detail Calcium score 2343 ---CT FFR analysis showed significant stenosis in the mid LAD. 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 Moderate disease in left circumflex  Lab work reviewed A1c 8.5  Carotid u/s in [place]: july 2022  Echocardiogram reviewed,from [place] performed August 2022  normal ejection fraction, no valve disease, normal pressures  EKG personally reviewed by myself on todays visit Normal sinus rhythm rate 82 bpm no significant ST-T wave changes  PMH:   has a past medical history of Diabetes mellitus without complication (HCC) and Thyroid disease.  PSH:    Past Surgical History:  Procedure Laterality Date   BACK SURGERY     CAROTID ENDARTERECTOMY     FOOT AMPUTATION      Current Outpatient  Medications  Medication Sig Dispense Refill   aspirin EC 81 MG tablet Take 1 tablet (81 mg total) by mouth daily. Swallow whole. 90 tablet 3   B Complex Vitamins (B COMPLEX 1 PO) B Complex     insulin lispro (HUMALOG) 100 UNIT/ML injection Humalog U-100 Insulin 100 unit/mL subcutaneous solution  INJECT UP TO 50 UNITS UNDER THE SKIN DAILY WITH INSULIN PUMP     levothyroxine (SYNTHROID) 100 MCG tablet levothyroxine 100 mcg tablet  take 1 tab po QD     lidocaine (LIDODERM) 5 % lidocaine 5 % topical patch     Omega-3 Fatty Acids (FISH OIL) 1000 MG CAPS Fish Oil     omeprazole (PRILOSEC) 40 MG capsule omeprazole 40 mg capsule,delayed release     acyclovir (ZOVIRAX) 400 MG tablet acyclovir 400 mg tablet  TK 1 T PO  BID (Patient not taking: No sig reported)     atorvastatin (LIPITOR) 20 MG tablet atorvastatin 20 mg tablet 90 tablet 3   midodrine (PROAMATINE) 5 MG tablet Take 1 tablet (5 mg total) by mouth daily as needed. For low BP 60 tablet 3   No current facility-administered medications for this visit.    Allergies:   Cranberry   Social History:  The patient  reports that she quit smoking about 2 years ago. Her smoking use included cigarettes. She has a 25.00 pack-year smoking history. She has never used smokeless tobacco. She reports current drug use. Drug: Marijuana. She reports that she does not drink alcohol.   Family History:   family history includes CAD in her father; Dementia in her mother.  Review of Systems: Review of Systems  Constitutional: Negative.   HENT: Negative.    Respiratory:  Positive for shortness of breath.   Cardiovascular:  Positive for chest pain.  Gastrointestinal: Negative.   Musculoskeletal: Negative.   Neurological: Negative.        Head fullness  Psychiatric/Behavioral: Negative.    All other systems reviewed and are negative.   PHYSICAL EXAM: VS:  BP 100/60 (BP Location: Left Arm, Patient Position: Sitting, Cuff Size: Normal)   Pulse 62   Ht  5\' 7"  (1.702 m)   Wt 165 lb 6 oz (75 kg)   SpO2 98%   BMI 25.90 kg/m  , BMI Body mass index is 25.9 kg/m. Constitutional:  oriented to person, place, and time. No distress.  HENT:  Head: Grossly normal Eyes:  no discharge. No scleral icterus.  Neck: No JVD, no carotid bruits  Cardiovascular: Regular rate and rhythm, no murmurs appreciated Pulmonary/Chest: Clear to auscultation bilaterally, no wheezes or rails Abdominal: Soft.  no distension.  no tenderness.  Musculoskeletal: Normal range of motion Neurological:  normal muscle tone. Coordination normal. No atrophy Skin: Skin warm and dry Psychiatric: normal affect, pleasant   Recent Labs: 08/29/2021: ALT 14; BUN 12; Creatinine, Ser 1.06; Hemoglobin 10.7; Platelets 252; Potassium 4.1; Sodium 136 09/18/2021: TSH 0.734    Lipid Panel Lab Results  Component Value Date   CHOL 146 09/18/2021   HDL 72 09/18/2021   LDLCALC 61 09/18/2021   TRIG 64 09/18/2021      Wt Readings from Last 3 Encounters:  10/15/21 165 lb 6 oz (75 kg)  09/24/21 167 lb 4 oz (75.9 kg)  09/18/21 169 lb 3.2 oz (76.7 kg)       ASSESSMENT AND PLAN:  Problem List Items Addressed This Visit       Cardiology Problems   Mixed hyperlipidemia   Relevant Medications   aspirin EC 81 MG tablet   atorvastatin (LIPITOR) 20 MG tablet   midodrine (PROAMATINE) 5 MG tablet   Other Visit Diagnoses     PAD (peripheral artery disease) (HCC)    -  Primary   Relevant Medications   aspirin EC 81 MG tablet   atorvastatin (LIPITOR) 20 MG tablet   midodrine (PROAMATINE) 5 MG tablet   Type 1 diabetes mellitus with complications (HCC)       Relevant Medications   aspirin EC 81 MG tablet   atorvastatin (LIPITOR) 20 MG tablet   Pre-procedural cardiovascular examination       Relevant Orders   Basic metabolic panel   CBC   EKG 12-Lead     Coronary artery disease with stable angina Cardiac CTA results discussed with her, Recommended cardiac catheterization given  shortness of breath, high risk features including PAD, endarterectomy -She lives in [place], would prefer any further work-up to be done there I have reviewed the risks, indications, and alternatives to cardiac catheterization, possible angioplasty, and stenting with the patient. Risks include but are not limited to bleeding, infection, vascular injury, stroke, myocardial infection, arrhythmia, kidney injury, radiation-related injury in the case of prolonged fluoroscopy use, emergency cardiac surgery, and death. The patient understands the risks of serious complication is 1-2 in 1000 with diagnostic cardiac cath and 1-2% or less with angioplasty/stenting.  -- We have recommended cardiac catheterization, this will be scheduled when her husband is back later this week, tentatively scheduled for 2/26 in [place] When she start aspirin 81 mg daily, stay on statin unable to add beta-blocker given hypotension -Anxious  about bruising, long discussion with her.  warfarin should not be needed, which she was concerned about  Peripheral arterial disease History of carotid endarterectomy on the right -Likely benefit from higher dose statin, adding Zetia Discussed with her at the time of her catheterization or in follow-up  Angina Cardiac CTA results discussed, catheterization as above scheduled for next week  Diabetes type 1 with complications A1c elevated, Recommend she follow-up with primary care, may need endocrine  Hyperlipidemia Continue Lipitor 20 for now, numbers at goal Could consider higher dose Lipitor in the future  Headaches Unclear if this is her anginal equivalent, may need MRI if no improvement    Total encounter time more than 25 minutes  Greater than 50% was spent in counseling and coordination of care with the patient  Signed, Dossie Arbour, M.D., Ph.D. [facility] [place], [place] [phone]

## 2021-10-15 NOTE — Patient Instructions (Signed)
Medication Instructions:  Please START  aspirin 81 mg daily  If you need a refill on your cardiac medications before your next appointment, please call your pharmacy.   Lab work: CBC & BMP  Pre-procedural labs   Testing/Procedures: Heart Cath with Dr. Kirke Corin   Follow-Up: At [facility], you and your health needs are our priority.  As part of our continuing mission to provide you with exceptional heart care, we have created designated Provider Care Teams.  These Care Teams include your primary Cardiologist (physician) and Advanced Practice Providers (APPs -  Physician Assistants and Nurse Practitioners) who all work together to provide you with the care you need, when you need it.  You will need a follow up appointment in 12 months  Providers on your designated Care Team:   Nicolasa Ducking, NP Eula Listen, PA-C Marisue Ivan, PA-C [place], [place]  COVID-19 Vaccine Information can be found at: [email or web address] For questions related to vaccine distribution or appointments, please email [email or web address] or call [phone].   [facility] Cardiac Cath Instructions  [facility] [address] [place], [place] [address] [phone] Please arrive at the [facility] main   You are scheduled for a Cardiac Cath on: 10/24/2021 (Wednesday) Please arrive at 08:30 am on the day of your procedure Your start time is 10:30 am  Please expect a call from our [facility] to pre-register you Do not eat/drink anything after midnight Someone will need to drive you home It is recommended someone be with you for the first 24 hours after your procedure Wear clothes that are easy to get on/off and wear slip on shoes if possible  Please bring a current list of all medications with you  _X__ You may take all of your medications the morning of your procedure with enough water to swallow  safely Be sure to take aspirin 81 mg the morning of the hearth cath  _X__ DO NOT take these medications before your procedure: Only take half dose insulin night before No insulin morning of procedure  For your safety and being able to monitor your condition, we request that they you do not wear nail polish, gel polish, artificial nails, or any other type of covering on natural nails including finger and toenails. If you have artificial nails, gel coating, etc.that requires to be removed by a nail saloon please have this removed prior to procedure. Your procedure may need to be canceled/ delayed if the performing provider/anesthesia team feels like the patient is unable to be adequately monitored.  Day of your procedure:  Please arrive at the [facility] main. Free valet service is available.  Please check-in at the registration desk to receive your armband. After receiving your armband someone will escort you to the cardiac cath/special procedures waiting area.  The usual length of stay after your procedure is about 2 to 3 hours.  This can vary.  If you have any questions, please call our office at [phone], or you may call the cardiac cath lab at [facility] directly at [phone]  Your physician has requested that you have a cardiac catheterization. Cardiac catheterization is used to diagnose and/or treat various heart conditions. Doctors may recommend this procedure for a number of different reasons. The most common reason is to evaluate chest pain. Chest pain can be a symptom of coronary artery disease (CAD), and cardiac catheterization can show whether plaque is narrowing or blocking your heart's arteries. This procedure is also used to evaluate  the valves, as well as measure the blood flow and oxygen levels in different parts of your heart. For further information please visit [email or web address] Please follow instruction sheet, as given.

## 2021-10-16 LAB — BASIC METABOLIC PANEL
BUN/Creatinine Ratio: 13 (ref 9–23)
BUN: 16 mg/dL (ref 6–24)
CO2: 25 mmol/L (ref 20–29)
Calcium: 10.2 mg/dL (ref 8.7–10.2)
Chloride: 99 mmol/L (ref 96–106)
Creatinine, Ser: 1.21 mg/dL — ABNORMAL HIGH (ref 0.57–1.00)
Glucose: 139 mg/dL — ABNORMAL HIGH (ref 70–99)
Potassium: 4.3 mmol/L (ref 3.5–5.2)
Sodium: 139 mmol/L (ref 134–144)
eGFR: 53 mL/min/{1.73_m2} — ABNORMAL LOW (ref >59)

## 2021-10-16 LAB — CBC
Hematocrit: 32.3 % — ABNORMAL LOW (ref 34.0–46.6)
Hemoglobin: 11.3 g/dL (ref 11.1–15.9)
MCH: 30.6 pg (ref 26.6–33.0)
MCHC: 35 g/dL (ref 31.5–35.7)
MCV: 88 fL (ref 79–97)
Platelets: 210 10*3/uL (ref 150–450)
RBC: 3.69 x10E6/uL — ABNORMAL LOW (ref 3.77–5.28)
RDW: 11.9 % (ref 11.7–15.4)
WBC: 8.5 10*3/uL (ref 3.4–10.8)

## 2021-10-18 ENCOUNTER — Other Ambulatory Visit: Payer: Self-pay

## 2021-10-18 ENCOUNTER — Ambulatory Visit (INDEPENDENT_AMBULATORY_CARE_PROVIDER_SITE_OTHER): Payer: Medicare Other | Admitting: Sports Medicine

## 2021-10-18 DIAGNOSIS — M79671 Pain in right foot: Secondary | ICD-10-CM

## 2021-10-18 DIAGNOSIS — I2510 Atherosclerotic heart disease of native coronary artery without angina pectoris: Secondary | ICD-10-CM | POA: Insufficient documentation

## 2021-10-18 DIAGNOSIS — E1010 Type 1 diabetes mellitus with ketoacidosis without coma: Secondary | ICD-10-CM

## 2021-10-18 DIAGNOSIS — Z89421 Acquired absence of other right toe(s): Secondary | ICD-10-CM

## 2021-10-18 DIAGNOSIS — L97511 Non-pressure chronic ulcer of other part of right foot limited to breakdown of skin: Secondary | ICD-10-CM

## 2021-10-18 NOTE — Progress Notes (Signed)
Subjective: Christina Bryan is a 55 y.o. type I diabetic female patient seen in office for follow-up evaluation of right foot wound secondary to abscess/blister.  Patient reports that she is doing good ok and will go for a cardiac procedure next week.  Fasting blood sugar not documented this visit.  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 Swallow whole. 90 tablet 3   atorvastatin (LIPITOR) 20 MG tablet atorvastatin 20 mg tablet 90 tablet 3   B Complex Vitamins (B COMPLEX 1 PO) B Complex     insulin lispro (HUMALOG) 100 UNIT/ML injection Humalog U-100 Insulin 100 unit/mL subcutaneous solution  INJECT UP TO 50 UNITS UNDER THE SKIN DAILY WITH INSULIN PUMP     levothyroxine (SYNTHROID) 100 MCG tablet levothyroxine 100 mcg tablet  take 1 tab po QD     lidocaine (LIDODERM) 5 % lidocaine 5 % topical patch     midodrine (PROAMATINE) 5 MG tablet Take 1 tablet (5 mg total) by mouth daily as needed. For low BP 60 tablet 3   Omega-3 Fatty Acids (FISH OIL) 1000 MG CAPS Fish Oil     omeprazole (PRILOSEC) 40 MG capsule omeprazole 40 mg capsule,delayed release     acyclovir (ZOVIRAX) 400 MG tablet acyclovir 400 mg tablet  TK 1 T PO  BID (Patient not taking: No sig reported)      No current facility-administered medications on file prior to visit.   Allergies  Allergen Reactions   Cranberry Hives    Recent Results (from the past 2160 hour(s))  Comprehensive metabolic panel     Status: Abnormal   Collection Time: 08/16/21  9:49 AM  Result Value Ref Range   Sodium 133 (L) 135 - 145 mmol/L   Potassium 5.1 3.5 - 5.1 mmol/L   Chloride 100 98 - 111 mmol/L   CO2 23 22 - 32 mmol/L   Glucose, Bld 307 (H) 70 - 99 mg/dL    Comment: Glucose reference range applies only to samples taken after fasting for at least 8 hours.   BUN 16 6 - 20 mg/dL   Creatinine, Ser 1.16 (H) 0.44 - 1.00 mg/dL   Calcium 9.5 8.9 - 10.3 mg/dL   Total Protein 7.4 6.5 - 8.1 g/dL   Albumin 3.5 3.5 - 5.0 g/dL   AST 17 15 - 41 U/L   ALT 15 0 - 44 U/L   Alkaline Phosphatase 79 38 - 126 U/L   Total Bilirubin 0.3 0.3 - 1.2 mg/dL   GFR, Estimated 56 (L) >60 mL/min    Comment: (NOTE) Calculated using the CKD-EPI Creatinine Equation (2021)    Anion gap 10 5 - 15    Comment: Performed at [facility] [address] [place], [place] [address]  Comprehensive metabolic panel  Status: Abnormal   Collection Time: 08/29/21  1:17 PM  Result Value Ref Range   Sodium 136 135 - 145 mmol/L   Potassium 4.1 3.5 - 5.1 mmol/L   Chloride 102 98 - 111 mmol/L   CO2 28 22 - 32 mmol/L   Glucose, Bld 161 (H) 70 - 99 mg/dL    Comment: Glucose reference range applies only to samples taken after fasting for at least 8 hours.   BUN 12 6 - 20 mg/dL   Creatinine, Ser 1.06 (H) 0.44 - 1.00 mg/dL   Calcium 9.9 8.9 - 10.3 mg/dL   Total Protein 7.1 6.5 - 8.1 g/dL   Albumin 3.6 3.5 - 5.0 g/dL   AST 16 15 - 41 U/L   ALT 14 0 - 44 U/L   Alkaline Phosphatase 92 38 - 126 U/L   Total Bilirubin 1.2 0.3 - 1.2 mg/dL   GFR, Estimated >60 >60 mL/min    Comment: (NOTE) Calculated using the CKD-EPI Creatinine Equation (2021)    Anion gap 6 5 - 15    Comment: Performed at [facility] [address]  [place], [place] [address]  CBC with Differential     Status: Abnormal   Collection Time: 08/29/21  1:17 PM  Result Value Ref Range   WBC 9.6 4.0 - 10.5 K/uL   RBC 3.51 (L) 3.87 - 5.11 MIL/uL   Hemoglobin 10.7 (L) 12.0 - 15.0 g/dL   HCT 32.5 (L) 36.0 - 46.0 %   MCV 92.6 80.0 - 100.0 fL   MCH 30.5 26.0 - 34.0 pg   MCHC 32.9 30.0 - 36.0 g/dL   RDW 12.6 11.5 - 15.5 %   Platelets 252 150 - 400 K/uL   nRBC 0.0 0.0 - 0.2 %   Neutrophils Relative % 71 %   Neutro Abs 6.9 1.7 - 7.7 K/uL   Lymphocytes Relative 18 %   Lymphs Abs 1.7 0.7 - 4.0 K/uL   Monocytes Relative 9 %   Monocytes Absolute 0.8 0.1 - 1.0 K/uL   Eosinophils Relative 1 %   Eosinophils Absolute 0.1 0.0 - 0.5 K/uL   Basophils Relative 1 %   Basophils Absolute 0.1 0.0 - 0.1 K/uL   Immature Granulocytes 0 %   Abs Immature Granulocytes 0.02 0.00 - 0.07 K/uL    Comment: Performed at [facility] [address], [place], [place] [address]  Sedimentation rate     Status: Abnormal   Collection Time: 08/29/21  1:17 PM  Result Value Ref Range   Sed Rate 50 (H) 0 - 22 mm/hr    Comment: Performed at [facility] [address], [place], [place] [address]  WOUND CULTURE     Status: Abnormal   Collection Time: 09/06/21  5:05 PM   Specimen: Abscess; Wound  Result Value Ref Range   MICRO NUMBER: [id number]    SPECIMEN QUALITY: Adequate    SOURCE: RIGHT FOOT    STATUS: FINAL    GRAM STAIN:      No white blood cells seen Few epithelial cells Moderate Gram positive bacilli Few Gram positive cocci in clusters Few Gram negative bacilli   ISOLATE 1: Serratia marcescens (A)     Comment: Light growth of Serratia marcescens      Susceptibility   Serratia marcescens - AEROBIC CULT, GRAM STAIN NEGATIVE 1    AMOX/CLAVULANIC 16 Resistant     CEFAZOLIN* >=64 Resistant      * For infections other  than uncomplicated UTI caused by E. coli, K. pneumoniae or P. mirabilis: Cefazolin is resistant if MIC > or = 8 mcg/mL. (Distinguishing  susceptible versus intermediate for isolates with MIC < or = 4 mcg/mL requires additional testing.)     CEFTAZIDIME <=1 Sensitive     CEFEPIME <=1 Sensitive     CEFTRIAXONE <=1 Sensitive     CIPROFLOXACIN <=0.25 Sensitive     LEVOFLOXACIN <=0.12 Sensitive     GENTAMICIN <=1 Sensitive     TOBRAMYCIN 4 Sensitive     TRIMETH/SULFA* <=20 Sensitive      * For infections other than uncomplicated UTI caused by E. coli, K. pneumoniae or P. mirabilis: Cefazolin is resistant if MIC > or = 8 mcg/mL. (Distinguishing susceptible versus intermediate for isolates with MIC < or = 4 mcg/mL requires additional testing.) Legend: S = Susceptible  I = Intermediate R = Resistant  NS = Not susceptible * = Not tested  NR = Not reported **NN = See antimicrobic comments   Microalbumin / Creatinine Urine Ratio     Status: None   Collection Time: 09/18/21  4:15 PM  Result Value Ref Range   Creatinine, Urine 130.6 Not Estab. mg/dL   Microalbumin, Urine 17.1 Not Estab. ug/mL   Microalb/Creat Ratio 13 0 - 29 mg/g creat    Comment:                        Normal:                0 -  29                        Moderately increased: 30 - 300                        Severely increased:       >300   Lipid Profile     Status: None   Collection Time: 09/18/21  4:17 PM  Result Value Ref Range   Cholesterol, Total 146 100 - 199 mg/dL   Triglycerides 64 0 - 149 mg/dL   HDL 72 >39 mg/dL   VLDL Cholesterol Cal 13 5 - 40 mg/dL   LDL Chol Calc (NIH) 61 0 - 99 mg/dL   Chol/HDL Ratio 2.0 0.0 - 4.4 ratio    Comment:                                   T. Chol/HDL Ratio                                             Men  Women                               1/2 Avg.Risk  3.4    3.3                                   Avg.Risk  5.0    4.4  2X Avg.Risk  9.6    7.1                                3X Avg.Risk 23.4   11.0   TSH     Status: None   Collection Time: 09/18/21  4:17 PM  Result Value Ref  Range   TSH 0.734 0.450 - 4.500 uIU/mL  Glucose, capillary     Status: Abnormal   Collection Time: 09/18/21  4:26 PM  Result Value Ref Range   Glucose-Capillary 152 (H) 70 - 99 mg/dL    Comment: Glucose reference range applies only to samples taken after fasting for at least 8 hours.  POC Hbg A1C     Status: Abnormal   Collection Time: 09/18/21  4:33 PM  Result Value Ref Range   Hemoglobin A1C 8.5 (A) 4.0 - 5.6 %   HbA1c POC (<> result, manual entry)     HbA1c, POC (prediabetic range)     HbA1c, POC (controlled diabetic range)    Basic metabolic panel     Status: Abnormal   Collection Time: 10/15/21 12:38 PM  Result Value Ref Range   Glucose 139 (H) 70 - 99 mg/dL   BUN 16 6 - 24 mg/dL   Creatinine, Ser 1.21 (H) 0.57 - 1.00 mg/dL   eGFR 53 (L) >59 mL/min/1.73   BUN/Creatinine Ratio 13 9 - 23   Sodium 139 134 - 144 mmol/L   Potassium 4.3 3.5 - 5.2 mmol/L   Chloride 99 96 - 106 mmol/L   CO2 25 20 - 29 mmol/L   Calcium 10.2 8.7 - 10.2 mg/dL  CBC     Status: Abnormal   Collection Time: 10/15/21 12:38 PM  Result Value Ref Range   WBC 8.5 3.4 - 10.8 x10E3/uL   RBC 3.69 (L) 3.77 - 5.28 x10E6/uL   Hemoglobin 11.3 11.1 - 15.9 g/dL   Hematocrit 32.3 (L) 34.0 - 46.6 %   MCV 88 79 - 97 fL   MCH 30.6 26.6 - 33.0 pg   MCHC 35.0 31.5 - 35.7 g/dL   RDW 11.9 11.7 - 15.4 %   Platelets 210 150 - 450 x10E3/uL    Objective: There were no vitals filed for this visit.  General: Patient is awake, alert, oriented x 3 and in no acute distress.  Dermatology: Skin is warm and dry bilateral with a partial thickness wound plantar right foot that measures 0.8x0.9 cm, improving, with a granular base there is small area of keratotic margin periwound with maceration noted to the sulcus area underneath the toes of the right foot with peeling blistered skin, minimal edema, resolved erythema, no malodor, no active drainage.  No other acute signs of infection.   Vascular: Dorsalis Pedis pulse = 1/4  Bilateral,  Posterior Tibial pulse = 1/4 Bilateral,  Capillary Fill Time < 5 seconds   Neurologic: Protective sensation diminished bilateral.   Musculosketal: No pain to palpation to ulcer/abscess area plantar right foot.  Status post right fifth toe amputation.  Hammertoe deformity noted on the left foot.   No results for input(s): GRAMSTAIN, LABORGA in the last 8760 hours.  Assessment and Plan:  Problem List Items Addressed This Visit       Endocrine   Type 1 diabetes mellitus ([place])   Other Visit Diagnoses     Right foot ulcer, limited to breakdown of skin ([place])    -  Primary  History of amputation of lesser toe of right foot (HCC)       Pain in right foot            -Examined patient and discussed the progression of the wound and treatment alternatives. -Excisionally dedbrided ulceration at right plantar forefoot to healthy bleeding borders removing nonviable tissue using a 15 blade.  Wound measures post debridement as above. Wound was debrided to the level of the dermis with viable wound base exposed to promote healing. Hemostasis was achieved with manuel pressure. Patient tolerated procedure well without any discomfort or anesthesia necessary for this wound debridement.  -Applied Betadine ointment to the perimeter and Medihoney to the central wound and oval shape offloading padding covered with dry sterile dressing and instructed patient to continue with daily dressings at home consisting of same daily as directed -Continue with cam boot at all times to reduce plantar pressure on the right foot - Advised patient to go to the ER or return to office if the wound worsens or if constitutional symptoms are present. -Patient to return to office in 2 weeks for follow up wound care  or sooner if problems arise.  Landis Martins, DPM

## 2021-10-22 ENCOUNTER — Ambulatory Visit (INDEPENDENT_AMBULATORY_CARE_PROVIDER_SITE_OTHER): Payer: Medicare Other | Admitting: Internal Medicine

## 2021-10-22 ENCOUNTER — Other Ambulatory Visit: Payer: Self-pay

## 2021-10-22 ENCOUNTER — Encounter: Payer: Self-pay | Admitting: Internal Medicine

## 2021-10-22 DIAGNOSIS — E1010 Type 1 diabetes mellitus with ketoacidosis without coma: Secondary | ICD-10-CM | POA: Diagnosis not present

## 2021-10-22 DIAGNOSIS — L02611 Cutaneous abscess of right foot: Secondary | ICD-10-CM | POA: Diagnosis not present

## 2021-10-22 DIAGNOSIS — I209 Angina pectoris, unspecified: Secondary | ICD-10-CM

## 2021-10-22 DIAGNOSIS — Z720 Tobacco use: Secondary | ICD-10-CM | POA: Diagnosis not present

## 2021-10-22 DIAGNOSIS — H60501 Unspecified acute noninfective otitis externa, right ear: Secondary | ICD-10-CM | POA: Diagnosis not present

## 2021-10-22 DIAGNOSIS — H6090 Unspecified otitis externa, unspecified ear: Secondary | ICD-10-CM | POA: Insufficient documentation

## 2021-10-22 DIAGNOSIS — I2510 Atherosclerotic heart disease of native coronary artery without angina pectoris: Secondary | ICD-10-CM

## 2021-10-22 MED ORDER — CIPRO HC 0.2-1 % OT SUSP: 4.0000 [drp] | Freq: Two times a day (BID) | OTIC | 0 refills | Status: AC

## 2021-10-22 NOTE — Assessment & Plan Note (Signed)
Patient should get lung cancer screening CT 2/2 smoking history, though patient asked to defer this until the next visit due to her other medical problems going on at this time. Says that she is 99.9 smoking free.

## 2021-10-22 NOTE — Assessment & Plan Note (Signed)
Patient says that she is scheduled to have a heart cath done in the next few weeks. She said that she was found to have 70% blockages in her heart. - f/u with cath results at next visit.

## 2021-10-22 NOTE — Assessment & Plan Note (Signed)
Patient has been recently treated for bilateral ear infections with antibiotics but has been noticing increased foul smelling drainage and difficulty hearing from her right ear. On exam there is a greenish colored discharge in the auditory canal with no effusion present.  - 7 days of cipro drops BID - if not improved in one week will refer to ENT

## 2021-10-22 NOTE — Assessment & Plan Note (Signed)
Patient continues to follow with podiatry, says that wound is healing well and started as the size of a golf ball and now is the size of a dime. - continue to monitor healing

## 2021-10-22 NOTE — Progress Notes (Signed)
   CC: Ear fullness  HPI:  Ms.Deshauna Monnier is a 55 y.o. PMH noted below, who presents to the [facility] with complaints of ear fullness. To see the management of his acute and chronic conditions, please refer to the A&P note under the encounters tab.   Past Medical History:  Diagnosis Date   Diabetes mellitus without complication (HCC)    Thyroid disease    Review of Systems:  positive for hearing loss, ear fullness, negative for ear pain, congestion, post nasal drop  Physical Exam: Gen: WNWD in NAD HEENT: normocephalic atraumatic, MMM, neck supple Left auditory canal clear with effusion of the tympanic membrane Right auditory canal with greenish colored discharge, no effusion behind the tympanic membrane CV: RRR, no m/r/g   Resp: CTAB, normal WOB  GI: soft, nontender MSK: moves all extremities without difficulty Skin:warm and dry, right foot in walking boot Neuro:alert answering questions appropriately Psych: normal affect   Assessment & Plan:   See Encounters Tab for problem based charting.  Patient seen with Dr. Mikey Bussing ;

## 2021-10-22 NOTE — Patient Instructions (Signed)
Christina Bryan  It was a pleasure seeing you in the clinic today.   We talked about your ear problems. You will start using an ear drop twice a day for one week. If the problem does not improve by the end of that treatment course we can consider sending you to the ear nose and throat doctor. Please contact our clinic to schedule another appointment if your symptoms don't resolve.  Please call our clinic at [phone] if you have any questions or concerns. The best time to call is Monday-Friday from 9am-4pm, but there is someone available 24/7 at the same number. If you need medication refills, please notify your pharmacy one week in advance and they will send [place] a request.   Thank you for letting us take part in your care. We look forward to seeing you next time!

## 2021-10-22 NOTE — Assessment & Plan Note (Signed)
Patient has a 35 year history of T1DM that has not been adequately controlled in the past. Patient is motivated to take care of herself now as she is dealing with the consequences of not controlling her sugars in the past. She says that she has a lot of stomach problems and difficulty eating certain foods but has been working to eat healthier and has bought an Psychologist, sport and exercise. Patient offered diabetic management services to discuss gastroparesis diet but she declined. - A1c in two months

## 2021-10-23 ENCOUNTER — Telehealth: Payer: Self-pay | Admitting: *Deleted

## 2021-10-23 NOTE — Telephone Encounter (Signed)
Cardiac catheterization scheduled at [facility] for: Wednesday October 24, 2021 10:30 AM [facility] Main Entrance A [facility]) at: 8:30 AM   No solid food after midnight prior to cath, clear liquids until 5 AM day of procedure.  Medication instructions: Insulin pump-pt will manage  Usual morning medications can be taken pre-cath with sips of water including aspirin 81 mg.    Confirmed patient has responsible adult to drive home post procedure and be with patient first 24 hours after arriving home.  [facility] does allow one visitor to accompany you and wait in the hospital waiting room while you are there for your procedure. You and your visitor will be asked to wear a mask once you enter the hospital.   Patient reports does not currently have any new symptoms concerning for COVID-19 and no household members with COVID-19 like illness.   Reviewed procedure/mask/visitor instructions with patient.

## 2021-10-23 NOTE — Progress Notes (Signed)
Internal Medicine Clinic Attending  I saw and evaluated the patient.  I personally confirmed the key portions of the history and exam documented by Dr. DeMaio   and I reviewed pertinent patient test results.  The assessment, diagnosis, and plan were formulated together and I agree with the documentation in the resident's note.  

## 2021-10-24 ENCOUNTER — Other Ambulatory Visit: Payer: Self-pay | Admitting: Cardiovascular Disease

## 2021-10-24 ENCOUNTER — Encounter (HOSPITAL_COMMUNITY): Payer: Self-pay | Admitting: Cardiovascular Disease

## 2021-10-24 ENCOUNTER — Ambulatory Visit (HOSPITAL_COMMUNITY)
Admission: RE | Admit: 2021-10-24 | Discharge: 2021-10-24 | Disposition: A | Discharge status: Home / Self Care | Payer: Medicare Other | Attending: Cardiovascular Disease | Admitting: Cardiovascular Disease

## 2021-10-24 ENCOUNTER — Encounter (HOSPITAL_COMMUNITY)
Admission: RE | Disposition: A | Discharge status: Home / Self Care | Payer: Self-pay | Source: Home / Self Care | Attending: Cardiovascular Disease

## 2021-10-24 ENCOUNTER — Other Ambulatory Visit: Payer: Self-pay

## 2021-10-24 DIAGNOSIS — Z7982 Long term (current) use of aspirin: Secondary | ICD-10-CM | POA: Diagnosis not present

## 2021-10-24 DIAGNOSIS — K3184 Gastroparesis: Secondary | ICD-10-CM | POA: Insufficient documentation

## 2021-10-24 DIAGNOSIS — R0602 Shortness of breath: Secondary | ICD-10-CM | POA: Insufficient documentation

## 2021-10-24 DIAGNOSIS — Z79890 Hormone replacement therapy (postmenopausal): Secondary | ICD-10-CM | POA: Insufficient documentation

## 2021-10-24 DIAGNOSIS — I25118 Atherosclerotic heart disease of native coronary artery with other forms of angina pectoris: Secondary | ICD-10-CM

## 2021-10-24 DIAGNOSIS — E890 Postprocedural hypothyroidism: Secondary | ICD-10-CM | POA: Diagnosis not present

## 2021-10-24 DIAGNOSIS — I2510 Atherosclerotic heart disease of native coronary artery without angina pectoris: Secondary | ICD-10-CM

## 2021-10-24 DIAGNOSIS — E782 Mixed hyperlipidemia: Secondary | ICD-10-CM | POA: Insufficient documentation

## 2021-10-24 DIAGNOSIS — Z8249 Family history of ischemic heart disease and other diseases of the circulatory system: Secondary | ICD-10-CM | POA: Diagnosis not present

## 2021-10-24 DIAGNOSIS — E1051 Type 1 diabetes mellitus with diabetic peripheral angiopathy without gangrene: Secondary | ICD-10-CM | POA: Diagnosis not present

## 2021-10-24 DIAGNOSIS — R519 Headache, unspecified: Secondary | ICD-10-CM | POA: Diagnosis not present

## 2021-10-24 DIAGNOSIS — Z79899 Other long term (current) drug therapy: Secondary | ICD-10-CM | POA: Insufficient documentation

## 2021-10-24 DIAGNOSIS — F1290 Cannabis use, unspecified, uncomplicated: Secondary | ICD-10-CM | POA: Diagnosis not present

## 2021-10-24 DIAGNOSIS — Z794 Long term (current) use of insulin: Secondary | ICD-10-CM | POA: Diagnosis not present

## 2021-10-24 HISTORY — PX: LEFT HEART CATH AND CORONARY ANGIOGRAPHY: CATH118249

## 2021-10-24 SURGERY — LEFT HEART CATH AND CORONARY ANGIOGRAPHY
Anesthesia: LOCAL

## 2021-10-24 MED ORDER — SODIUM CHLORIDE 0.9 % WEIGHT BASED INFUSION: 1.0000 mL/kg/h | INTRAVENOUS | Status: DC

## 2021-10-24 MED ORDER — LIDOCAINE HCL (PF) 1 % IJ SOLN
INTRAMUSCULAR | Status: DC | PRN
  Administered 2021-10-24: 2 mL

## 2021-10-24 MED ORDER — FENTANYL CITRATE (PF) 100 MCG/2ML IJ SOLN
INTRAMUSCULAR | Status: AC
  Filled 2021-10-24: qty 2

## 2021-10-24 MED ORDER — FENTANYL CITRATE (PF) 100 MCG/2ML IJ SOLN
INTRAMUSCULAR | Status: DC | PRN
  Administered 2021-10-24: 50 ug via INTRAVENOUS

## 2021-10-24 MED ORDER — HEPARIN SODIUM (PORCINE) 1000 UNIT/ML IJ SOLN
INTRAMUSCULAR | Status: DC | PRN
  Administered 2021-10-24: 4000 [IU] via INTRAVENOUS

## 2021-10-24 MED ORDER — ONDANSETRON HCL 4 MG/2ML IJ SOLN: 4.0000 mg | Freq: Four times a day (QID) | INTRAMUSCULAR | Status: DC | PRN

## 2021-10-24 MED ORDER — SODIUM CHLORIDE 0.9 % IV SOLN: 250.0000 mL | INTRAVENOUS | Status: DC | PRN

## 2021-10-24 MED ORDER — ASPIRIN 81 MG PO CHEW: 81.0000 mg | CHEWABLE_TABLET | ORAL | Status: DC

## 2021-10-24 MED ORDER — SODIUM CHLORIDE 0.9% FLUSH: 3.0000 mL | INTRAVENOUS | Status: DC | PRN

## 2021-10-24 MED ORDER — LIDOCAINE HCL (PF) 1 % IJ SOLN
INTRAMUSCULAR | Status: AC
  Filled 2021-10-24: qty 30

## 2021-10-24 MED ORDER — IOHEXOL 350 MG/ML SOLN
INTRAVENOUS | Status: DC | PRN
  Administered 2021-10-24: 70 mL

## 2021-10-24 MED ORDER — MIDAZOLAM HCL 2 MG/2ML IJ SOLN
INTRAMUSCULAR | Status: AC
  Filled 2021-10-24: qty 2

## 2021-10-24 MED ORDER — SODIUM CHLORIDE 0.9 % IV SOLN: INTRAVENOUS | Status: DC

## 2021-10-24 MED ORDER — SODIUM CHLORIDE 0.9 % WEIGHT BASED INFUSION
3.0000 mL/kg/h | INTRAVENOUS | Status: AC
  Administered 2021-10-24: 3 mL/kg/h via INTRAVENOUS

## 2021-10-24 MED ORDER — VERAPAMIL HCL 2.5 MG/ML IV SOLN
INTRAVENOUS | Status: DC | PRN
  Administered 2021-10-24: 10 mL via INTRA_ARTERIAL

## 2021-10-24 MED ORDER — HEPARIN (PORCINE) IN NACL 1000-0.9 UT/500ML-% IV SOLN
INTRAVENOUS | Status: AC
  Filled 2021-10-24: qty 500

## 2021-10-24 MED ORDER — MIDAZOLAM HCL 2 MG/2ML IJ SOLN
INTRAMUSCULAR | Status: DC | PRN
  Administered 2021-10-24 (×2): 1 mg via INTRAVENOUS

## 2021-10-24 MED ORDER — HEPARIN (PORCINE) IN NACL 1000-0.9 UT/500ML-% IV SOLN
INTRAVENOUS | Status: DC | PRN
  Administered 2021-10-24 (×2): 500 mL

## 2021-10-24 MED ORDER — SODIUM CHLORIDE 0.9% FLUSH: 3.0000 mL | Freq: Two times a day (BID) | INTRAVENOUS | Status: DC

## 2021-10-24 MED ORDER — VERAPAMIL HCL 2.5 MG/ML IV SOLN
INTRAVENOUS | Status: AC
  Filled 2021-10-24: qty 2

## 2021-10-24 MED ORDER — ACETAMINOPHEN 325 MG PO TABS: 650.0000 mg | ORAL_TABLET | ORAL | Status: DC | PRN

## 2021-10-24 MED ORDER — HEPARIN SODIUM (PORCINE) 1000 UNIT/ML IJ SOLN
INTRAMUSCULAR | Status: AC
  Filled 2021-10-24: qty 1

## 2021-10-24 SURGICAL SUPPLY — 9 items
CATH INFINITI 5FR JK (CATHETERS) ×2 IMPLANT
DEVICE RAD COMP TR BAND LRG (VASCULAR PRODUCTS) ×2 IMPLANT
GLIDESHEATH SLEND SS 6F .021 (SHEATH) ×2 IMPLANT
GUIDEWIRE INQWIRE 1.5J.035X260 (WIRE) ×1 IMPLANT
INQWIRE 1.5J .035X260CM (WIRE) ×2
KIT HEART LEFT (KITS) ×2 IMPLANT
PACK CARDIAC CATHETERIZATION (CUSTOM PROCEDURE TRAY) ×2 IMPLANT
TRANSDUCER W/STOPCOCK (MISCELLANEOUS) ×2 IMPLANT
TUBING CIL FLEX 10 FLL-RA (TUBING) ×2 IMPLANT

## 2021-10-24 NOTE — Interval H&P Note (Signed)
Cath Lab Visit (complete for each Cath Lab visit)  Clinical Evaluation Leading to the Procedure:   ACS: No.  Non-ACS:    Anginal Classification: CCS II  Anti-ischemic medical therapy: No Therapy  Non-Invasive Test Results: High-risk stress test findings: cardiac mortality >3%/year  Prior CABG: No previous CABG      History and Physical Interval Note:  10/24/2021 9:54 AM  Christina Bryan  has presented today for surgery, with the diagnosis of ANGINA, SEVERE STENOSIS.  The various methods of treatment have been discussed with the patient and family. After consideration of risks, benefits and other options for treatment, the patient has consented to  Procedure(s): LEFT HEART CATH AND CORONARY ANGIOGRAPHY (N/A) as a surgical intervention.  The patient's history has been reviewed, patient examined, no change in status, stable for surgery.  I have reviewed the patient's chart and labs.  Questions were answered to the patient's satisfaction.     Lorine Bears

## 2021-10-30 ENCOUNTER — Telehealth: Payer: Self-pay

## 2021-10-30 NOTE — Telephone Encounter (Signed)
RTC, Patient was seen on 10/24 and given ABX gtts for her ear pain.  Pt states she was told if it did not help to call back and MD would send in a referral for her to see an ENT (this is also noted in office notes).  Pt states it hasn't helped at all and wants a referral.  Will send to team.  Pt also wanting to know if she should come back to [facility] for someone else to look at her ear until she gets into ENT? Please advise. Thank you, SChaplin, RN,BSN

## 2021-10-30 NOTE — Telephone Encounter (Signed)
Pt states she still having ear pain, the medication is not working. Please call pt back.

## 2021-11-01 ENCOUNTER — Ambulatory Visit (INDEPENDENT_AMBULATORY_CARE_PROVIDER_SITE_OTHER): Payer: Medicare Other | Admitting: Sports Medicine

## 2021-11-01 ENCOUNTER — Other Ambulatory Visit: Payer: Self-pay

## 2021-11-01 ENCOUNTER — Encounter: Payer: Self-pay | Admitting: Sports Medicine

## 2021-11-01 DIAGNOSIS — L97511 Non-pressure chronic ulcer of other part of right foot limited to breakdown of skin: Secondary | ICD-10-CM

## 2021-11-01 DIAGNOSIS — E1010 Type 1 diabetes mellitus with ketoacidosis without coma: Secondary | ICD-10-CM

## 2021-11-01 DIAGNOSIS — Z89421 Acquired absence of other right toe(s): Secondary | ICD-10-CM

## 2021-11-01 NOTE — Progress Notes (Signed)
Subjective: Christina Bryan is a 55 y.o. type I diabetic female patient seen in office for follow-up evaluation of right foot wound secondary to abscess/blister.  Patient reports that she is doing good ok and reports that she had a cardiac procedure and was told that they need to manage her symptoms with medication.  Patient reports that she is concerned about how her right foot may eventually heal and is wondering if there is anything from a vascular standpoint that she needs to have done.  Fasting blood sugar not documented this visit.  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 Swallow whole. 90 tablet 3   atorvastatin (LIPITOR) 20 MG tablet atorvastatin 20 mg tablet (Patient taking differently: Take 20 mg by mouth every evening.) 90 tablet 3   B Complex Vitamins (B COMPLEX 1 PO) B Complex     calcium carbonate (OSCAL) 1500 (600 Ca) MG TABS tablet Take 1,200 mg of elemental calcium by mouth every evening.     insulin lispro (HUMALOG) 100 UNIT/ML injection Humalog U-100 Insulin 100 unit/mL subcutaneous solution  INJECT UP TO 50 UNITS UNDER THE SKIN DAILY WITH INSULIN PUMP      levothyroxine (SYNTHROID) 100 MCG tablet Take 100 mcg by mouth every evening.     lidocaine (LIDODERM) 5 % lidocaine 5 % topical patch     midodrine (PROAMATINE) 5 MG tablet Take 1 tablet (5 mg total) by mouth daily as needed. For low BP 60 tablet 3   Omega-3 Fatty Acids (FISH OIL) 1000 MG CAPS Fish Oil     omeprazole (PRILOSEC) 40 MG capsule Take 40 mg by mouth every evening.     No current facility-administered medications on file prior to visit.   Allergies  Allergen Reactions   Aminoglycosides Nausea And Vomiting    Mycin Drugs   Cranberry Hives   Morphine And Related Itching   Triprolidine-Pseudoephedrine Hives    Recent Results (from the past 2160 hour(s))  Comprehensive metabolic panel     Status: Abnormal   Collection Time: 08/16/21  9:49 AM  Result Value Ref Range   Sodium 133 (L) 135 - 145 mmol/L   Potassium 5.1 3.5 - 5.1 mmol/L   Chloride 100 98 - 111 mmol/L   CO2 23 22 - 32 mmol/L   Glucose, Bld 307 (H) 70 - 99 mg/dL    Comment: Glucose reference range applies only to samples taken after fasting for at least 8 hours.   BUN 16 6 - 20 mg/dL   Creatinine, Ser 1.16 (H) 0.44 - 1.00 mg/dL   Calcium 9.5 8.9 - 10.3 mg/dL   Total Protein 7.4 6.5 - 8.1 g/dL  Albumin 3.5 3.5 - 5.0 g/dL   AST 17 15 - 41 U/L   ALT 15 0 - 44 U/L   Alkaline Phosphatase 79 38 - 126 U/L   Total Bilirubin 0.3 0.3 - 1.2 mg/dL   GFR, Estimated 56 (L) >60 mL/min    Comment: (NOTE) Calculated using the CKD-EPI Creatinine Equation (2021)    Anion gap 10 5 - 15    Comment: Performed at [facility] [address] [place], [place] [address]  Comprehensive metabolic panel     Status: Abnormal   Collection Time: 08/29/21  1:17 PM  Result Value Ref Range   Sodium 136 135 - 145 mmol/L   Potassium 4.1 3.5 - 5.1 mmol/L   Chloride 102 98 - 111 mmol/L   CO2 28 22 - 32 mmol/L   Glucose, Bld 161 (H) 70 - 99 mg/dL    Comment: Glucose reference range applies only to samples taken after fasting for  at least 8 hours.   BUN 12 6 - 20 mg/dL   Creatinine, Ser 1.06 (H) 0.44 - 1.00 mg/dL   Calcium 9.9 8.9 - 10.3 mg/dL   Total Protein 7.1 6.5 - 8.1 g/dL   Albumin 3.6 3.5 - 5.0 g/dL   AST 16 15 - 41 U/L   ALT 14 0 - 44 U/L   Alkaline Phosphatase 92 38 - 126 U/L   Total Bilirubin 1.2 0.3 - 1.2 mg/dL   GFR, Estimated >60 >60 mL/min    Comment: (NOTE) Calculated using the CKD-EPI Creatinine Equation (2021)    Anion gap 6 5 - 15    Comment: Performed at [facility] [address] [place], [place] [address]  CBC with Differential     Status: Abnormal   Collection Time: 08/29/21  1:17 PM  Result Value Ref Range   WBC 9.6 4.0 - 10.5 K/uL   RBC 3.51 (L) 3.87 - 5.11 MIL/uL   Hemoglobin 10.7 (L) 12.0 - 15.0 g/dL   HCT 32.5 (L) 36.0 - 46.0 %   MCV 92.6 80.0 - 100.0 fL   MCH 30.5 26.0 - 34.0 pg   MCHC 32.9 30.0 - 36.0 g/dL   RDW 12.6 11.5 - 15.5 %   Platelets 252 150 - 400 K/uL   nRBC 0.0 0.0 - 0.2 %   Neutrophils Relative % 71 %   Neutro Abs 6.9 1.7 - 7.7 K/uL   Lymphocytes Relative 18 %   Lymphs Abs 1.7 0.7 - 4.0 K/uL   Monocytes Relative 9 %   Monocytes Absolute 0.8 0.1 - 1.0 K/uL   Eosinophils Relative 1 %   Eosinophils Absolute 0.1 0.0 - 0.5 K/uL   Basophils Relative 1 %   Basophils Absolute 0.1 0.0 - 0.1 K/uL   Immature Granulocytes 0 %   Abs Immature Granulocytes 0.02 0.00 - 0.07 K/uL    Comment: Performed at [facility] [address] [place], [place] [address]  Sedimentation rate     Status: Abnormal   Collection Time: 08/29/21  1:17 PM  Result Value Ref Range   Sed Rate 50 (H) 0 - 22 mm/hr    Comment: Performed at [facility] [address] [place], [place] [address]  WOUND CULTURE     Status: Abnormal   Collection Time: 09/06/21  5:05 PM   Specimen: Abscess; Wound  Result Value Ref Range   MICRO NUMBER: [id number]    SPECIMEN QUALITY: Adequate    SOURCE: RIGHT  FOOT    STATUS: FINAL    GRAM STAIN:      No white blood cells seen Few  epithelial cells Moderate Gram positive bacilli Few Gram positive cocci in clusters Few Gram negative bacilli   ISOLATE 1: Serratia marcescens (A)     Comment: Light growth of Serratia marcescens      Susceptibility   Serratia marcescens - AEROBIC CULT, GRAM STAIN NEGATIVE 1    AMOX/CLAVULANIC 16 Resistant     CEFAZOLIN* >=64 Resistant      * For infections other than uncomplicated UTI caused by E. coli, K. pneumoniae or P. mirabilis: Cefazolin is resistant if MIC > or = 8 mcg/mL. (Distinguishing susceptible versus intermediate for isolates with MIC < or = 4 mcg/mL requires additional testing.)     CEFTAZIDIME <=1 Sensitive     CEFEPIME <=1 Sensitive     CEFTRIAXONE <=1 Sensitive     CIPROFLOXACIN <=0.25 Sensitive     LEVOFLOXACIN <=0.12 Sensitive     GENTAMICIN <=1 Sensitive     TOBRAMYCIN 4 Sensitive     TRIMETH/SULFA* <=20 Sensitive      * For infections other than uncomplicated UTI caused by E. coli, K. pneumoniae or P. mirabilis: Cefazolin is resistant if MIC > or = 8 mcg/mL. (Distinguishing susceptible versus intermediate for isolates with MIC < or = 4 mcg/mL requires additional testing.) Legend: S = Susceptible  I = Intermediate R = Resistant  NS = Not susceptible * = Not tested  NR = Not reported **NN = See antimicrobic comments   Microalbumin / Creatinine Urine Ratio     Status: None   Collection Time: 09/18/21  4:15 PM  Result Value Ref Range   Creatinine, Urine 130.6 Not Estab. mg/dL   Microalbumin, Urine 17.1 Not Estab. ug/mL   Microalb/Creat Ratio 13 0 - 29 mg/g creat    Comment:                        Normal:                0 -  29                        Moderately increased: 30 - 300                        Severely increased:       >300   Lipid Profile     Status: None   Collection Time: 09/18/21  4:17 PM  Result Value Ref Range   Cholesterol, Total 146 100 - 199 mg/dL   Triglycerides 64 0 - 149 mg/dL   HDL 72 >39 mg/dL   VLDL Cholesterol Cal 13 5 - 40  mg/dL   LDL Chol Calc (NIH) 61 0 - 99 mg/dL   Chol/HDL Ratio 2.0 0.0 - 4.4 ratio    Comment:                                   T. Chol/HDL Ratio                                             Men  Women  1/2 Avg.Risk  3.4    3.3                                   Avg.Risk  5.0    4.4                                2X Avg.Risk  9.6    7.1                                3X Avg.Risk 23.4   11.0   TSH     Status: None   Collection Time: 09/18/21  4:17 PM  Result Value Ref Range   TSH 0.734 0.450 - 4.500 uIU/mL  Glucose, capillary     Status: Abnormal   Collection Time: 09/18/21  4:26 PM  Result Value Ref Range   Glucose-Capillary 152 (H) 70 - 99 mg/dL    Comment: Glucose reference range applies only to samples taken after fasting for at least 8 hours.  POC Hbg A1C     Status: Abnormal   Collection Time: 09/18/21  4:33 PM  Result Value Ref Range   Hemoglobin A1C 8.5 (A) 4.0 - 5.6 %   HbA1c POC (<> result, manual entry)     HbA1c, POC (prediabetic range)     HbA1c, POC (controlled diabetic range)    Basic metabolic panel     Status: Abnormal   Collection Time: 10/15/21 12:38 PM  Result Value Ref Range   Glucose 139 (H) 70 - 99 mg/dL   BUN 16 6 - 24 mg/dL   Creatinine, Ser 1.21 (H) 0.57 - 1.00 mg/dL   eGFR 53 (L) >59 mL/min/1.73   BUN/Creatinine Ratio 13 9 - 23   Sodium 139 134 - 144 mmol/L   Potassium 4.3 3.5 - 5.2 mmol/L   Chloride 99 96 - 106 mmol/L   CO2 25 20 - 29 mmol/L   Calcium 10.2 8.7 - 10.2 mg/dL  CBC     Status: Abnormal   Collection Time: 10/15/21 12:38 PM  Result Value Ref Range   WBC 8.5 3.4 - 10.8 x10E3/uL   RBC 3.69 (L) 3.77 - 5.28 x10E6/uL   Hemoglobin 11.3 11.1 - 15.9 g/dL   Hematocrit 32.3 (L) 34.0 - 46.6 %   MCV 88 79 - 97 fL   MCH 30.6 26.6 - 33.0 pg   MCHC 35.0 31.5 - 35.7 g/dL   RDW 11.9 11.7 - 15.4 %   Platelets 210 150 - 450 x10E3/uL    Objective: There were no vitals filed for this visit.  General: Patient is awake,  alert, oriented x 3 and in no acute distress.  Dermatology: Skin is warm and dry bilateral with a partial thickness wound plantar right foot that measures 0.6x0.7cm, improving, with a granular base there is small area of keratotic margin periwound with maceration noted to the sulcus area underneath the toes of the right foot with peeling blistered skin, minimal edema, resolved erythema, no malodor, no active drainage.  No other acute signs of infection.   Vascular: Dorsalis Pedis pulse = 1/4 Bilateral,  Posterior Tibial pulse = 1/4 Bilateral,  Capillary Fill Time < 5 seconds   Neurologic: Protective sensation diminished bilateral.   Musculosketal: No pain to palpation to ulcer/abscess area plantar right foot.  Status post right fifth toe amputation.  Hammertoe deformity noted on the left foot.   No results for input(s): GRAMSTAIN, LABORGA in the last 8760 hours.  Assessment and Plan:  Problem List Items Addressed This Visit       Endocrine   Type 1 diabetes mellitus ([place])   Other Visit Diagnoses     Right foot ulcer, limited to breakdown of skin ([place])    -  Primary   History of amputation of lesser toe of right foot ([place])            -Examined patient and discussed the progression of the wound and treatment alternatives. -Excisionally dedbrided ulceration at right plantar forefoot to healthy bleeding borders removing nonviable tissue using a 15 blade.  Wound measures post debridement as above. Wound was debrided to the level of the dermis with viable wound base exposed to promote healing. Hemostasis was achieved with manuel pressure. Patient tolerated procedure well without any discomfort or anesthesia necessary for this wound debridement.  -Applied Betadine ointment to the perimeter and Medihoney to the central wound and oval shape offloading padding covered with dry sterile dressing and instructed patient to continue with daily dressings at home consisting of same daily as  directed -Continue with cam boot at all times to reduce plantar pressure on the right foot like before -Reassured patient that wound is improving however if there is any change would recommend an ABI testing after patient's visit I did discuss with her cardiologist and he recommended to go ahead to get ABI testing done of which she will order for her - Advised patient to go to the ER or return to office if the wound worsens or if constitutional symptoms are present. -Patient to return to office in 2 weeks for follow up wound care  or sooner if problems arise.  Landis Martins, DPM

## 2021-11-02 ENCOUNTER — Telehealth: Payer: Self-pay

## 2021-11-02 ENCOUNTER — Other Ambulatory Visit: Payer: Self-pay

## 2021-11-02 DIAGNOSIS — I739 Peripheral vascular disease, unspecified: Secondary | ICD-10-CM

## 2021-11-02 DIAGNOSIS — T148XXD Other injury of unspecified body region, subsequent encounter: Secondary | ICD-10-CM

## 2021-11-02 NOTE — Progress Notes (Signed)
Message routing to triage to order imagining of pt's LE and f/u with Dr. Kirke Corin afterwards.  Iran Ouch, MD  P Cv Div Burl Triage Cc: Asencion Islam, DPM Please schedule this patient for lower extremity arterial segmental Doppler for suspected PAD and slow healing wound.  Follow-up with me after.    Order placed, will r/t to scheduling to have arrange date and time. Looking to be Dec as soonest. Attempted to call pt, LMTCB

## 2021-11-02 NOTE — Progress Notes (Signed)
Lmov to schedule  

## 2021-11-02 NOTE — Telephone Encounter (Signed)
Attempted to reach pt, unable to make contact LMTCB to have [place] schedule per Dr. Meta Hatchet, Chelsea Aus, MD  P Cv Div Burl Triage Cc: Asencion Islam, DPM Please schedule this patient for lower extremity arterial segmental Doppler for suspected PAD and slow healing wound.  Follow-up with me after.

## 2021-11-05 NOTE — Telephone Encounter (Signed)
Patient declined to schedule imaging at this time.  States she just did these tests at [facility] .    Scheduled December fu with Dr. Kirke Corin and requested  from [place] [facility] all card imaging results on file be faxed for md review.

## 2021-11-05 NOTE — Telephone Encounter (Signed)
Spoke with patient.

## 2021-11-05 NOTE — Telephone Encounter (Signed)
Patient Calls (Newest Message First) You 3 days ago   [facility] Lmov to schedule       Note        Maple Hudson, RN  Cv Div Burl Scheduling; Jarvis Newcomer, RN 3 days ago   [employer],  Pt needs to have [place] schedule and f/u appt with Dr. Kirke Corin afterwards  Thanks  Harlow Mares, Jill Side, RN 3 days ago   Message routing to triage to order imagining of pt's LE and f/u with Dr. Kirke Corin afterwards.  Iran Ouch, MD  P Cv Div Burl Triage Cc: Asencion Islam, DPM Please schedule this patient for lower extremity arterial segmental Doppler for suspected PAD and slow healing wound.  Follow-up with me after.     Order placed, will r/t to scheduling to have arrange date and time. Looking to be Dec as soonest. Attempted to call pt, LMTCB      Note

## 2021-11-05 NOTE — Telephone Encounter (Signed)
Noted  

## 2021-11-09 ENCOUNTER — Ambulatory Visit (INDEPENDENT_AMBULATORY_CARE_PROVIDER_SITE_OTHER): Payer: Medicare Other | Admitting: Podiatry

## 2021-11-09 ENCOUNTER — Other Ambulatory Visit: Payer: Self-pay

## 2021-11-09 DIAGNOSIS — L03115 Cellulitis of right lower limb: Secondary | ICD-10-CM | POA: Diagnosis not present

## 2021-11-09 DIAGNOSIS — E11628 Type 2 diabetes mellitus with other skin complications: Secondary | ICD-10-CM

## 2021-11-09 DIAGNOSIS — L089 Local infection of the skin and subcutaneous tissue, unspecified: Secondary | ICD-10-CM

## 2021-11-14 NOTE — Progress Notes (Signed)
Subjective:  Patient ID: Christina Bryan, female    DOB: 1966-11-08,  MRN: [id number]  Chief Complaint  Patient presents with   Wound Check    Right foot ulcer  Possible infection     55 y.o. female presents with the above complaint.  Patient presents with complaint of right foot infection/diabetic foot infection.  Patient states that she is a diabetic with last A1c of 8.5.  She said the redness got worse.  It is red swollen hot.  She said it came out of nowhere.  She had taken antibiotics p.o. but she is currently not on any antibiotics.  She does not recall any kind of injury.  She has been treated for an ulcer to the right foot by Dr. Marylene Land.  She was seen about 2 weeks ago in our office.  She denies any other acute complaints she has been changing the dressing herself.  No nausea fever chills vomiting.   Review of Systems: Negative except as noted in the HPI. Denies N/V/F/Ch.  Past Medical History:  Diagnosis Date   Diabetes mellitus without complication (HCC)    Thyroid disease     Current Outpatient Medications:    acyclovir (ZOVIRAX) 400 MG tablet, acyclovir 400 mg tablet  TK 1 T PO  BID (Patient not taking: No sig reported), Disp: , Rfl:    aspirin EC 81 MG tablet, Take 1 tablet (81 mg total) by mouth daily. Swallow whole., Disp: 90 tablet, Rfl: 3   atorvastatin (LIPITOR) 20 MG tablet, atorvastatin 20 mg tablet (Patient taking differently: Take 20 mg by mouth every evening.), Disp: 90 tablet, Rfl: 3   B Complex Vitamins (B COMPLEX 1 PO), B Complex, Disp: , Rfl:    calcium carbonate (OSCAL) 1500 (600 Ca) MG TABS tablet, Take 1,200 mg of elemental calcium by mouth every evening., Disp: , Rfl:    insulin lispro (HUMALOG) 100 UNIT/ML injection, Humalog U-100 Insulin 100 unit/mL subcutaneous solution  INJECT UP TO 50 UNITS UNDER THE SKIN DAILY WITH INSULIN PUMP, Disp: , Rfl:    levothyroxine (SYNTHROID) 100 MCG tablet, Take 100 mcg by mouth every evening., Disp: , Rfl:    lidocaine  (LIDODERM) 5 %, lidocaine 5 % topical patch, Disp: , Rfl:    midodrine (PROAMATINE) 5 MG tablet, Take 1 tablet (5 mg total) by mouth daily as needed. For low BP, Disp: 60 tablet, Rfl: 3   Omega-3 Fatty Acids (FISH OIL) 1000 MG CAPS, Fish Oil, Disp: , Rfl:    omeprazole (PRILOSEC) 40 MG capsule, Take 40 mg by mouth every evening., Disp: , Rfl:   Social History   Tobacco Use  Smoking Status Former   Packs/day: 1.00   Years: 25.00   Pack years: 25.00   Types: Cigarettes   Quit date: 09/09/2019   Years since quitting: 2.1  Smokeless Tobacco Never    Allergies  Allergen Reactions   Aminoglycosides Nausea And Vomiting    Mycin Drugs   Cranberry Hives   Morphine And Related Itching   Triprolidine-Pseudoephedrine Hives   Objective:  There were no vitals filed for this visit. There is no height or weight on file to calculate BMI. Constitutional Well developed. Well nourished.  Vascular Dorsalis pedis pulses non palpable bilaterally. Posterior tibial pulses non palpable bilaterally. Capillary refill normal to all digits.  No cyanosis or clubbing noted. Pedal hair growth normal.  Neurologic Normal speech. Oriented to person, place, and time. Epicritic sensation to light touch grossly present bilaterally.  Dermatologic Right plantar  foot wound noted with correlation to the dorsal side.  Concern for deep abscess.  No area of fluctuance or crepitus appreciated clinically.  Redness noted up to the level of the ankle.  There is edema present.  No purulent drainage was expressed.  No malodor present.  Orthopedic: Normal joint ROM without pain or crepitus bilaterally. No visible deformities. No bony tenderness.   Radiographs: None Assessment:   1. Diabetic foot infection (HCC)   2. Cellulitis of foot, right    Plan:  Patient was evaluated and treated and all questions answered.  Right diabetic foot infection with underlying ulceration in the setting of uncontrolled diabetes -I  explained the patient the etiology of infection versus treatment options were discussed.  Given the nature of the infection and the aggressive nature to it I believe patient will benefit from admission into the hospital for IV antibiotics and a possible MRI to evaluate and further manage.  I discussed this with the patient in extensive detail. -I also believe she will benefit from ABIs PVRs as there is no recent studies that I can see in the system. -She would like to go to [facility] for further care.  I instructed and educated her that I do not have privileges over there however I am happy to care for her at [facility] if she decides to go here.  She states that she would rather go to [facility] for now.  She will follow back up with [place] when she has received care from there. -Betadine wet-to-dry dressing -Admission for IV antibiotics and an MRI and a possible surgical intervention. -I discussed with her that she is at high risk for losing her foot or the leg given her uncontrolled setting of diabetes.  She states understanding  No follow-ups on file.

## 2021-11-21 NOTE — Discharge Summary (Signed)
-------------------------------------------------------------------------------  Attestation signed by Benson Setting, MD at 11/26/2021 12:08 PM  The following medical problems were present on arrival and are being actively managed during this admission:    Ostomyelitis, Renal Disease/Failure, stage 3a, Fluid & Electrolyte Disorders, Liver Disease, and Anemia.     Benson Setting, MD    -------------------------------------------------------------------------------      [name] [name] VASCULAR SURGERY - THE [facility]    DISCHARGE SUMMARY     [name],[name]  Address: [address]  [place] [address]  MRN: [id number]  DOB: [birth year]  Acct#: [id number]  Admission Date: 11/12/2021  Discharge Date: 11/23/2021   Length of Stay: 11  Discharge Attending: Benson Setting, MD Telephone number: [phone]  Discharge Service: Vascular Surgery  Discharge Unit: [facility] 11E  PCP: No PCP (Pt has no PCP)    Problems:  Principal Problem:    Diabetic ulcer of right midfoot associated with type 2 diabetes mellitus, with necrosis of muscle  Active Problems:    Cellulitis, unspecified cellulitis site    Type 1 diabetes mellitus with diabetic polyneuropathy    S/P amputation of lesser toe, right    Acute osteomyelitis of right ankle or foot    Osteomyelitis due to Staphylococcus aureus    Nausea and vomiting, unspecified vomiting type    Hyperglycemia    Insulin pump in place  Resolved Problems:    * No resolved hospital problems. *     Additional issues needed to be included in prob list?    Reason for Hospitalization:   1. Nonhealing diabetic foot ulceration, right foot.  2. Osteomyelitis, right foot.  3. Diabetes with peripheral neuropathy.    Procedures:  11/15/2021 - Excision of presumptive osteomyelitis, second metatarsal, right foot.  Debridement of plantar wound including bone, right foot.    11/19/2021 - 1. Excision of partial metatarsals 1 and 3, right.   2. Staged secondary closure.    3. Debridement of plantar wound including subcutaneous tissue, right foot.  4. Bone biopsy 2nd metatarsal right.    Consultations:   IP CONSULT TO PODIATRY  CONSULT TO INPATIENT DIABETES MANAGEMENT SERVICE (IDMS) - SERVICE PAGR  IP CONSULT TO DIETITIAN/NUTRITION  ANTIBIOTIC APPROVAL/DENIAL (AUTHORIZED APPROVERS)  IP CONSULT TO INFECTIOUS DISEASES  ANTIBIOTIC APPROVAL/DENIAL (AUTHORIZED APPROVERS)  IP CONSULT TO INTERVENTIONAL RADIOLOGY  Dietician/Nutrition     History of Present Illness/Hospital Course:  [name] [name] is a 55 year old female with history of hypothyroidism, type 1 DM (on insulin pump) and diabetic neuropathy with history of right 4th and 5th metatarsal resections who presents with a chronic right plantar wound in the setting of trauma after stepping on a vacuum cord 8 weeks ago. Previous surgical wound healed with amniotic membrane. She was recently seen at [facility] where she underwent MRI without evidence of osteomyelitis but with reactive changes to the 2nd to 3rd metatarsal heads. She left [facility] and came to [facility] for second opinion. No history of claudication.    ABI testing showed normal perfusion to bilateral lower extremities.    On 11/15/21 and 11/19/21, the patient was taken to the operating room, where Dr. Lahoma Rocker performed the above referenced procedure(s). The patient tolerated the procedure well, was not intubated in the OR, and transferred to the PACU in stable condition.    The endocrinology service was consulted and the patient was maintained on her insulin pump    Infectious disease was consulted for long term antibiotic management for osteomyelitis and provided recommendations  for outpatient follow up. A Hickman line was placed on 11/23/2021 with no complications.  The patient needs  Ancef 2 g TID with a start date of 11/19/2021 and an end date of 12/02/2021. She has been getting her IV Antibiotics till 11/23/2021.     Nutrition was also consulted and provided  recommendations to optimize nutritional status to promote healing.    The patient's weight bearing status is limited ambulation for household activities with [facility] walker for right lower extremity and weight bearing as tolerated for left lower extremity. The patient should utilize a rolling walker to assist with ambulation. Absolutely NO stairs, Absolutely NO stationary bicycle, Absolutely NO extended standing, and Absolutely NO ambulating long distances.      Wound care recommendations are as follows:   - Daily dressing changes.  - Apply Iodosorb to plantar ulceration.  - Please remove the previous Iodosorb from the ulcer site, clean the ulcer site and then apply the new Iodosorb.  - Dry gauze to dorsal incision lines. Kerlix and Ace wrap over top of all sites.  (Also see patient instructions)    Throughout her hospitalization, the patient remained hemodynamically stable, dressings remained clean, dry, and intact without evidence of infection, hematoma, or seroma, and she was able to void spontaneously without a Foley catheter.   By 11/23/2021, the patient was tolerating a carbohydrate controlled diet, ambulating independently, and her pain was well controlled on oral pain medication. The patient was subsequently discharged AGAINST MEDICAL ADVICE to home with plan for follow-up as outlined below.    Home care is still pending for wound care and IV antibiotics. The agency will contact her on Monday 11/26/2021.     She wants to leave AGAINST MEDICAL ADVICE without having her home care set up due to extreme circumstances.  Her father is extremely ill and is in the hospital and is doing very poorly while her mother has severe dementia and to be taken care of and she feels that she should go to  [place] to care of her mother and see her father.  She understands the risks of going without proper set up for her central line and is aware of the complications.  She is aware of the red flags she needs to look out for and  will seek medical attention as soon as possible.  She has signed the [employer] paperwork.    The patient was discharged on ASA 81, Lipitor      Functional Status and Exam at Discharge:   Temp: 37.1 C (98.8 F), Heart Rate: 76, Resp: (!) 21, BP: 180/99, SpO2: 100 %, O2 Device: None-room air, Pain Score: 0      Most Recent Labs:  CBC  Lab Results   Component Value Date    WBC 9.92 11/20/2021    RBC 3.48 (L) 11/20/2021    HGB 10.6 (L) 11/20/2021    HCT 32.8 (L) 11/20/2021    MCV 94.3 11/20/2021    MCHC 32.3 11/20/2021    MCH 30.5 11/20/2021    RDW 13.3 11/20/2021    PLT 249 11/20/2021    NRBC 0.00 11/20/2021       CMP  Lab Results   Component Value Date    NA 139 11/21/2021    K 4.0 11/21/2021    CL 98 11/21/2021    CO2 32 (H) 11/21/2021    GLU 117 (H) 11/21/2021    BUN 6 (L) 11/21/2021    CREATININE 1.2 11/21/2021    CALCIUM  9.7 11/21/2021    PROT 6.8 11/12/2021    ALBUMIN 3.9 11/12/2021    BILITOT 0.4 11/12/2021    ALKPHOS 125 (H) 11/12/2021    ANIONGAP 9 11/21/2021       Micro:  Blood Cultures:  Fungal: No results for input(s): BLOODISOCUL in the last 72 hours.  Aerobic: No results for input(s): LABBLOO, BLOODCULT in the last 72 hours.  Fluid Cultures:  Aerobic/Anaerobic Culture:    Recent Labs   Lab 11/19/21  1030   AERANACUL No growth to date     Aerobic Culture:    No results for input(s): LABAERO in the last 168 hours.    All Microbiology Results:   Microbiology Results (Last 30 Days)       Procedure Component Value Units Date/Time    Flu A+B/RSV + SARS CoV-2 NAT NP Swab, Symptomatic [[id number]]     Order Status: Canceled Specimen: Respiratory, Nasopharyngeal Swab     Bacterial Culture, NOT Urine/Blood/Resp [[id number]]  (Normal) Collected: 11/19/21 1030    Order Status: Completed Specimen: Tissue, Bone Updated: 11/20/21 0820     Aerobic/Anaerobic Misc Culture No growth to date     Gram stain No polymorphonuclear leukocytes or organisms seen    Fungal Culture [[id number]] Collected: 11/19/21 1030    Order Status: Sent  Specimen: Tissue, Bone Updated: 11/19/21 1218    Narrative:      The following orders were created for panel order Fungal Culture.  Procedure                               Abnormality         Status                     ---------                               -----------         ------                     Fungal Culture with Micr.Marland KitchenMarland Kitchen[[id number]]                      Preliminary result           Please view results for these tests on the individual orders.    Mycobacterial Culture [[id number]] Collected: 11/19/21 1030    Order Status: Completed Specimen: Tissue, Bone Updated: 11/19/21 1601     AFB Mycobacterial Culture No growth to date     AFB Smear Negative for acid-fast bacilli by Fluorochrome stain.    Fungal Culture with Microscopic [[id number]] Collected: 11/19/21 1030    Order Status: Completed Specimen: Tissue, Bone Updated: 11/20/21 1253     Fungal Culture No growth of fungus to date     Fungal Microscopic exam No fungus seen by calcofluor white stain.    Bacterial Culture, NOT Urine/Blood/Resp - Anaerobic/Aerobic [[id number]] Collected: 11/15/21 2112    Order Status: Canceled Specimen: Abscess, Toe     Bacterial Culture, NOT Urine/Blood/Resp [[id number]]  (Normal) Collected: 11/15/21 2112    Order Status: Completed Specimen: Abscess, Toe Updated: 11/17/21 0656     Aerobic Misc Culture No growth     Gram stain Microscopic not performed; Insufficient material    Bacterial Culture, NOT Urine/Blood/Resp - Anaerobic/Aerobic [[id number]] Collected: 11/15/21 1932    Order Status: Canceled  Specimen: Abscess, Toe Updated: 11/15/21 1932    Bacterial Culture, NOT Urine/Blood/Resp [[id number]]  (Abnormal)  (Susceptibility) Collected: 11/15/21 1932    Order Status: Completed Specimen: Abscess, Toe Updated: 11/18/21 1208     Aerobic Misc Culture Very Light Mixed Skin Flora      Very Light growth Staphylococcus aureus*     Comment: Methicillin susceptible by penicillin binding protein 2a (PBP2a) lateral flow assay.        Gram  stain Microscopic not performed; Insufficient material    Susceptibility        Staphylococcus aureus     Clindamycin  Resistant     Oxacillin <=0.25 ug/mL Susceptible     Tetracycline <=0.5 ug/mL Susceptible     Trimethoprim-Sulfamethoxazole <=0.5/9.5 ug/mL Susceptible     Vancomycin 1 ug/mL Susceptible                          Bacterial Culture, NOT Urine/Blood/Resp - Anaerobic/Aerobic [[id number]]  (Abnormal)  (Susceptibility) Collected: 11/15/21 1715    Order Status: Completed Specimen: Tissue from Wound Surgical, Foot Right Updated: 11/20/21 0935     Aerobic/Anaerobic Misc Culture Positive Culture*      Very Light growth Staphylococcus aureus*     Gram stain No polymorphonuclear leukocytes or organisms seen    Susceptibility        Staphylococcus aureus     Clindamycin  Resistant     Oxacillin <=0.25 ug/mL Susceptible     Tetracycline <=0.5 ug/mL Susceptible     Trimethoprim-Sulfamethoxazole <=0.5/9.5 ug/mL Susceptible     Vancomycin 1 ug/mL Susceptible                          Bacterial Culture, NOT Urine/Blood/Resp [[id number]]  (Abnormal)  (Susceptibility) Collected: 11/12/21 0933    Order Status: Completed Specimen: Wound, Foot Right Updated: 11/16/21 0944     Aerobic/Anaerobic Misc Culture Positive Culture*      Heavy Growth Staphylococcus aureus*     Comment: Methicillin susceptible by penicillin binding protein 2a (PBP2a) lateral flow assay.         Heavy Growth Streptococcus group C/G (Large colony)*     Comment: Penicillin is the drug of choice for treatment of -hemolytic streptococcal infections. No antimicrobial susceptibilities to follow.             Light growth Candida glabrata*     Comment: No additional workup, including further identification and antimicrobial susceptibility testing.          Gram stain Light Polymorphonuclear leukocytes*      Heavy Gram positive cocci*    Susceptibility        Staphylococcus aureus     Clindamycin  Resistant     Oxacillin <=0.25 ug/mL Susceptible      Tetracycline <=0.5 ug/mL Susceptible     Trimethoprim-Sulfamethoxazole <=0.5/9.5 ug/mL Susceptible     Vancomycin 1 ug/mL Susceptible                          SARS-CoV-2 NAT, Nasal Swab, Asymptomatic [[id number]]  (Normal) Collected: 11/12/21 0648    Order Status: Completed Specimen: Respiratory, Nasal Swab Updated: 11/12/21 0749     SARS-COV-2 No RNA Detected     Comment: No SARS-CoV-2 RNA detected by PCR.    The Xpert Xpress CoV-2 plus  assay is designed to detect unique sequences in the nucleocapsid (N), envelope (E), and RNA-dependent RNA polymerase (RdRP) genes  of SARS-CoV-2 genome. A No RNA Detected result does not preclude the possibility of COVID-19 infection since the adequacy of sample collection and/or low viral burden may result in the presence of viral nucleic acids below the analytical sensitivity of this test method. Test results should be used along with other clinical and laboratory data in making the diagnosis.    This test has received FDA Emergency Use Authorization and has been verified by the [facility] Microbiology laboratory. This test is only authorized for the duration of the declaration and the circumstances that exist to justify the authorization of the emergency use of in vitro diagnostic tests for the detection of SARS-CoV-2 virus and/or diagnosis of COVID-19 infection under section 564(b)(1) of the Act, 21 U.S.C. 454UJW-1(X)(9), unless the authorization is terminated or revoked sooner.    The [facility] Microbiology Labs are certified under CLIA-88 as qualified to perform high complexity testing.  This testing was performed in the [facility] Microbiology laboratory located at [address], [place]   [address] (CLIA License Numbers: [id number], [id number]; CAP number: [id number] and [place] State license numbers [phone] [id number]).    Additional information about testing can be found on the Medical Microbiology page of the [facility]  Medicine Pathology website. Copy and paste this link for direct access to this page: [email or web address]    Patient results disclosed to [place] and/or [address] [facility] [place] [facility] at the time and date of result verification.         Bacterial/Yeast Culture, Blood [[id number]]  (Normal) Collected: 11/12/21 0320    Order Status: Completed Specimen: Blood, peripheral Updated: 11/17/21 0500     Blood/Bone Marrow Culture No growth at 5 days    Bacterial/Yeast Culture, Blood [[id number]]  (Normal) Collected: 11/12/21 0320    Order Status: Completed Specimen: Blood, peripheral Updated: 11/17/21 0500     Blood/Bone Marrow Culture No growth at 5 days            Radiology:  Results for orders placed or performed during the hospital encounter of 11/12/21   XR Foot Right Minimum 3 VWS    Collection Time: 11/12/21  5:37 AM    Addendum: 11/13/2021      Addendum:     Older images if available from elsewhere, and if clinically indicated follow-up coned to the area of interest, may provide additional information    Images and interpretation personally reviewed by: Marlinda Mike, MD      Addendum: 11/13/2021      Addendum:     Older images if available from elsewhere, and if clinically indicated follow-up coned to the area of interest, may provide additional information    Images and interpretation personally reviewed by: Marlinda Mike, MD      Narrative    EXAM: XR FOOT RIGHT MIN 3 VWS    INDICATION: Type 1 diabetes presenting with plantar foot wound and   streaking erythema        Impression    (followed by other findings)    Fifth metatarsal amputation with resection of fourth metatarsal head.   Surgical fusion of second, third, and fourth PIP joints with screws.    Plantar soft tissue defect adjacent to MTP joints compatible with   ulceration.    No definite periosteal reaction or osseous erosions to suggest   osteomyelitis.      Findings relayed to Lindaann Slough, MD at 5:50 AM, 11/12/2021.  THIS  REPORT HAS BEEN CHANGED FROM THE  PRELIMINARY REPORT IN A WAY THAT MAY   AFFECT TREATMENT.      These findings were communicated to Dr. Barbette Merino with receipt   confirmation on 11/12/2021 at 1035.    The preliminary impression of possible osteomyelitis was removed in final   report.     FLAG:(M)    Images and interpretation personally reviewed by: Terence Lux, MD    Images and interpretation personally reviewed by: Gwyndolyn Saxon, MD   VAS LOWER EXTREMITY ARTERIAL DOPPLER/ABI SINGLE LEVEL    Collection Time: 11/12/21 11:05 AM   Result Value Ref Range    JHM CV VAS RIGHT ABI CALCULATION -1.00     JHM CV VAS LEFT ABI CALCULATION -1.00     JHM CV VAS RIGHT TBI CALCULATION -1.00     JHM CV VAS LEFT TBI CALCULATION -1.00     JHM CV VAS RIGHT POST EXERCISE ABI MANUAL ENTRY -1.00     JHM CV VAS LEFT POST EXERCISE ABI MANUAL ENTRY -1.00     JHM CV VAS MANUAL OVERRIDE CHECKED ABI RIGHT 0     JHM CV VAS LEFT RESTING ABI CALCULATED VALUE 0     JHM CV VAS RIGHT RESTING TBI CALCULATED VALUE 0     JHM CV VAS LEFT RESTING TBI CALCULATED VALUE 0     JHM CV VAS RIGHT POST EXERCISE ABI 0     JHM CV VAS LEFT POST EXERCISE ABI 0     RIGHT DORSAL BEDIS ABI WAVEFORM 180 mmHg    LEFT DORSAL BEDIS ABI WAVEFORM 156 mmHg    Left arm BP 156 mmHg    Right arm BP 145 mmHg    Right posterior tibial 173 mmHg    Left ABI 1.00     Right ABI 1.15     Left toe pressure 120 mmHg    Right toe pressure 119 mmHg    Left TBI 0.77     Right TBI 0.76     JHM CV VAS LEFT POST TIBIAL PRINT GROUP VALUE N/C     JHM CV VAS RIGHT POST EXERCISE ABI OVERRIDE LRR FLAG 1     JHM CV VAS LEFT POST EXERCISE ABI OVERRIDE LRR FLAG 1     JHM CV VAS RIGHT RESTING ABI OVERRIDE FLAG 1     JHM CV VAS LEFT RESTING ABI OVERRIDE FLAG 1     JHM CV VAS RIGHT RESTING TBI OVERRIDE FLAG 1     JHM CV VAS LEFT RESTING TBI OVERRIDE FLAG 1     Narrative     Lower extremity noninvasive arterial Doppler evaluation demonstrated   normal perfusion to both lower extremities at rest based upon ankle    pressure and waveform analysis.  The toe-brachial index was normal   bilaterally   XR Foot Right Minimum 3 VWS    Collection Time: 11/15/21 10:20 PM    Addendum: 11/19/2021      Addendum to digit numbering:    FINDINGS:    Postsurgical changes of 2nd metatarsal head resection with radiopaque material within the surgical bed, may be antibiotic material.     Prior right fifth metatarsal and phalanx amputation, fourth metatarsal head amputation, and second through fourth proximal phalanges screw fixation without evidence of screw loosening. Unchanged chronic appearing irregularity of the third metatarsal   head. There is no evidence of acute fracture. Unchanged flexion of the great toe IP joint. Mild first MTP joint degenerative change.      IMPRESSION: Postsurgical changes of  2nd metatarsal head amputation with radiopaque material within the surgical bed, may be antibiotic material.    Images and interpretation personally reviewed by: Ronny Flurry, MD      Narrative    EXAM: XR FOOT RIGHT MIN 3 VWS    INDICATION: post op     TECHNIQUE: Frontal, lateral, and oblique views of the right foot were obtained.    COMPARISON: 11/12/2021    FINDINGS:    Postsurgical changes of fourth metatarsal head resection with radiopaque material within the surgical bed, may be antibiotic material. Prior right fifth metatarsal and phalanx amputation, fourth metatarsal head amputation, and second through fourth   proximal phalanges screw fixation without evidence of screw loosening. Unchanged chronic appearing irregularity of the third metatarsal head. There is no evidence of acute fracture. Unchanged flexion of the great toe PIP joint. Mild first MTP joint   degenerative change.        Impression    IMPRESSION: Postsurgical changes of first metatarsal head amputation with radiopaque material within the surgical bed, may be antibiotic material.    Images and interpretation personally reviewed by: Ronny Flurry, MD   XR Foot Right  Minimum 3 VWS    Collection Time: 11/19/21  4:03 PM    Narrative    EXAM: XR FOOT RIGHT MIN 3 VWS    INDICATION: Diabetic polyneuropathy status post foot amputation     FINDINGS:  Surgical changes from amputation of the distal first through fourth metatarsals. First through third metatarsal amputations are new from prior. Screw arthrodesis of the proximal and middle phalanges in the second through fourth digits. Diffuse   subcutaneous soft tissue swelling. Status post amputation of the fifth metatarsal and fifth phalanx. Diffuse osteopenia.      Impression    IMPRESSION:  Surgical changes from new amputation of the distal first through third metatarsals    Images and interpretation personally reviewed by: Brennan Bailey, MD   XR Abdomen AP Supine    Collection Time: 11/20/21  4:31 AM    Narrative    EXAM: XR Abdomen 1 view    INDICATION: hyperglycemia  TECHNIQUE: XR ABDOMEN AP SUPINE    FINDINGS:  Nonobstructive bowel gas pattern.  Spinal cord stimulator appears intact.  Lumbar posterior spinal instrumented fusion and laminectomy, partially visualized.      Impression    IMPRESSION:  No acute abnormality.    Images and interpretation personally reviewed by: Wynne Dust, MD   IR Catheter Hickman / Tunneled    Collection Time: 11/23/21  3:28 PM    Narrative    PROCEDURE: Tunneled central venous catheter placement     Procedural Personnel  Attending physician(s): Christos Georgiades  Fellow physician(s): Michele Rockers  Resident physician(s): None  Advanced practice provider(s): None    Pre-procedure diagnosis: Osteomyelitis  Post-procedure diagnosis: Same  Indication: Antibiotics  Additional clinical history: None    Complications: No immediate complications.      Impression    IMPRESSION:    Insertion of right-sided single-lumen tunneled Hickman-type Powerline brand catheter, with tip in the expected location of the cavoatrial junction.    Plan:     The catheter may be used  immediately.  _______________________________________________________________    PROCEDURE SUMMARY:  - Venous access with ultrasound guidance  - Tunneled central venous catheter insertion with fluoroscopic guidance  - Additional procedure(s): None    PROCEDURE DETAILS:    Pre-procedure  History and imaging of central venous access reviewed (QCDR): Yes   Consent: Informed consent  for the procedure including risks, benefits and alternatives was obtained and time-out was performed prior to the procedure.  Preparation (MIPS): The site was prepared and draped using all elements of maximal sterile barrier technique including sterile gloves, sterile gown, cap, mask, large sterile sheet, sterile ultrasound probe cover, hand hygiene and cutaneous antisepsis   with 2% chlorhexidine.   Medical reason for site preparation exception (MIPS): Not applicable    Anesthesia/sedation  The first dose of sedation was given in the presence of Christos Georgiades by an independent trained observer.   Attending face-to-face time began at 1513 and ended at 12 for a   total sedation time of 16 minutes.    Level of anesthesia/sedation: Moderate sedation (conscious sedation)    Access  Local anesthesia was administered. The vessel was sonographically evaluated and determined to be patent. Real time ultrasound was used to visualize needle entry into the vessel and a permanent image was stored.  Vein accessed (QCDR): Internal jugular vein  Access technique: Micropuncture set with 21 gauge needle    Venography  Indication for venography: Not performed  Vein catheterized: Not applicable  Findings: Not applicable    Catheter placement  An incision was made near the venous access site and the catheter was tunneled subcutaneously to the venous access site and trimmed to appropriate length. The catheter was advanced via a peel-away sheath into the vein under fluoroscopic guidance.   Catheter tip location was fluoroscopically verified and a permanent  image was stored.  Catheter placed: Powerline SL  Catheter size Janice Norrie): 5  Catheter tip position: 2.5 vertebral body units (VBUs) below the carina.  Unique Device Identifier (UDI): Not available  Catheter flush: Normal saline    Closure  A sterile dressing was applied.  Access site closure technique: Tissue adhesive  Catheter securement technique: Non-absorbable suture    Contrast  Contrast agent: None  Contrast volume (mL): 0    Radiation Dose  Fluoroscopy time (minutes): 0.5    Reference air kerma (mGy): 6   Kerma area product (uGy-m2): 117.43     Additional Details  Additional description of procedure: None  Equipment details: None  Specimens removed: None  Estimated blood loss (mL): Less than 10  Standardized report: [email or web address]_[email or web address]    Attestation  Signer name: Shann Medal, MD PhD  I attest that I was present for the entire procedure. I reviewed the stored images and agree with the report as written.          Images and interpretation personally reviewed by: Michele Rockers, MD    Images and interpretation personally reviewed by: Shann Medal, MD PhD       Past Medical History:  Past Medical History:   Diagnosis Date   . Diabetes mellitus        Allergies:  Allergies/Adverse Reactions/Intolerances   Allergen Reactions   . Actifed [Triprolidine-Pseudoephedrine] Hives   . Morphine Hives   . Vancomycin Analogues Nausea and vomiting       Medications:           Medication List           * If you have questions about this list or if there are differences between this list and the labels on the medications you are using at home, please ask your pharmacist or provider.                START taking these medications        Dose Instructions   alteplase 2 mg  injection  Commonly known as: CATHFLO ACTIVASE   2 mLs (2 mg total) by Intracatheter route as needed (For catheter occlusion). >=30 kg: 2 mg/ 2 mL instilled into occluded catheter  <30 kg: instill dose equal to 110% of catheter lumen volume   MAX  dose 2 mg in 2 mL: up to 2 doses may be used within 24 hours, separated by 120 minutes     aspirin 81 mg chewable tablet  Last time this was given: 81 mg on November 23, 2021 10:34 AM   Take 1 tablet (81 mg total) by mouth daily.     ceFAZolin  infusion  Last time this was given: ____  Commonly known as: ANCEF   Inject 2,000 mg (2 g total) into the vein every 8 (eight) hours for 9 days.     heparin lock flush (porcine) 10 unit/mL syringe  Last time this was given: ____   3 mLs (30 Units total) by Intracatheter route As Directed. 5 mLs (50 Units total) by intracatheter route as needed (Utilizing the SASH method).  Flush post-infusion.  Flush daily per lumen when not in use.     ondansetron 4 MG tablet  Last time this was given: ____  Commonly known as: ZOFRAN   Take 1 tablet (4 mg total) by mouth every 6 (six) hours as needed for Nausea.     promethazine 12.5 MG tablet  Last time this was given: ____  Commonly known as: PHENERGAN   Take 1 tablet (12.5 mg total) by mouth every 6 (six) hours as needed.     sodium chloride flush syringe   10 mLs by Intracatheter route As Directed. 10 mLs by Intracatheter route as needed (Utilizing the SASH method).  Flush 1 pre- and 2 post-infusion.     sterile water for injection Soln  Commonly known as: PF   2 mLs by Miscellaneous route as needed. To be used for reconstitution of Cath Flo (alteplase) as per package instructions as needed for occluded catheter     sulfamethoxazole-trimethoprim 800-160 mg per tablet  Commonly known as: BACTRIM DS   Take 1 tablet by mouth 2 (two) times daily for 10 days.            CONTINUE taking these medications        Dose Instructions   atorvastatin 20 MG tablet  Last time this was given: 20 mg on November 22, 2021  8:09 PM  Commonly known as: LIPITOR   Take by mouth.     insulin lispro 100 unit/mL injection  Commonly known as: HumaLOG   Inject under the skin 3 (three) times daily before meals.     LEVOthyroxine 100 MCG tablet  Last time this was  given: 100 mcg on November 23, 2021  5:00 AM  Commonly known as: SYNTHROID   Take by mouth.     MIDODRINE ORAL   Take by mouth.     omeprazole 40 mg DR capsule  Commonly known as: PRILOSEC   Take by mouth.              Disposition:      Patient Instructions on Discharge:  Discharge diet:Carbohydrate controlled.    Activities:Walk with assistance, Limited ambulation, and Walker. IF YOU HAVE A FOOT WOUND, THEN Absolutely NO stairs, Absolutely NO stationary bicycle, Absolutely NO extended standing, and Absolutely NO ambulating long distances.    Restrictions:No stairs, No lifting greater than 10 lbs for 2 weeks after surgery, No swimming, and Avoid  straining and strenuous activity until cleared.    Weight bearing: household ambulation. Do not advance activity beyond this until approved by surgeon     Specialty Foot/legwear:DH walker.    Dr. Lahoma Rocker recommends the [facility] WALKING BOOT to offload your wound.      It keeps your foot in a 90 degree angle, and the special insole has pegs which might be removed to take pressure off your wound on the bottom of your foot.      Use your [facility] walker whenever your foot is on the ground.    Remember to roll from heel to toe while wearing your boot.  Do not walk flat-footed, like Frankenstein, because that could hurt your hip.    Please call our office for any signs of skin irritation on your lower leg or for any problems. [phone] option 2).    If you are cleared to drive, do not attempt to drive while wearing boot.  Bring a spare shoe to drive.    You should wear your boot whenever your foot is on the ground.      You do not need to wear your boot to bed.    For a video on how to walk in your boot, please view:             [email or web address]    Personal Treatment and Care: You may shower upon discharge, however, your foot wound cannot get wet. Steri-strips (if applicable) will fall off naturally. Pat your wound dry. Do NOT rub. Do NOT scrub or soak wound. Keep  incision site(s) clean by washing with gentle soap and water. No shaving to operative site until further instructed. If you have a wound or incision on the foot do not get that foot wet.    Stitches/staples to be removed:at clinic to be determined by Dr. Lahoma Rocker.    Incision/Wound Care:   Number of incisions/wounds: 1.    Incision/Wound site: Right foot.    Apply to wound base:Iodosorb to plantar wound (bottom of foot).    Cover incision or wound with:Dry gauze. Wrap loosely with rolled gauze (kerlix) and ACE wrap.    Change dressing: Every other day.    Central line  :Keep your central line clean and dry  Monitor your central line site for redness, drainage, pain, or swelling daily  Report any concerns to our office immediately.  Your dressing should be changed once weekly by home health nursing   Please flush with 10cc normal saline pre infusion.   Please flush with 20cc normal saline followed by 5cc heparin (100units/mL)  post infusion.       Other Post-Discharge Care: None.         Educational Handouts: Per nursing.    Home care services:  Will the patient need home care services: Yes.    Which services are needed:Home health for wound care and Infusion/Enteral.    When to call your doctor:  For life threatening symptoms such as shortness of breath, chest pain and signs of stroke (such as sudden numbness or weakness of the face, arm or leg especially on one side of the body, sudden confusion, trouble speaking or understanding, sudden trouble seeing in one or both eyes, sudden trouble walking, dizziness, loss of balance or coordination, sudden severe headache with no known cause) call 911 or go to the nearest Emergency Room.    Call your Doctor if: You have a temperature over 101F, You have redness, swelling, pus, and/or drainage, Severe  or increasing pain not relieved by your pain medication, and Changes in the color, temperature, and sensation in the legs.     Other reasons to call your Doctor: For any other  questions or concerns related to this admission.    During office hours, call your surgeons office at [phone]. After hours call [phone] and ask for the vascular surgery provider on call.    Items Needing Follow Up:  Pending Labs       Order Current Status    Surgical pathology exam: Routine In process    Surgical pathology exam: Routine In process    Bacterial Culture, NOT Urine/Blood/Resp Preliminary result    Fungal Culture Preliminary result    Fungal Culture with Microscopic Preliminary result    Mycobacterial Culture Preliminary result               Follow-Up Appointments:      Future Appointments (up to 10 shown)      Dec 07, 2021  8:30 AM  (Arrive by 8:00 AM)  Postop with Joneen Caraway, DPM  [facility] ([facility] [place] [facility] (Facility Fee/[place] [address] [place] [address] [place] [address]  [phone]   Please arrive early to allow time for registration.     Dec 07, 2021 10:00 AM  (Arrive by 9:30 AM)  Follow Up ID with Kerrie Buffalo, MD  [facility] ([facility] (Facility Fee/[place] [place])) [address] [place] [address]  [phone]   Please arrive early to allow time for registration.            For full detail of all procedures, imaging studies, and laboratory findings from this encounter please access [facility] at [email or web address]. You may also access this information via [facility] at [email or web address].     Note: I have reviewed and updated, if indicated, any information recalled into this document from a prior document.     Muhammad Abdullah Arain M.B.B.S.  [facility] General Surgery Resident    *Some images could not be shown.

## 2021-11-23 ENCOUNTER — Telehealth: Payer: Self-pay | Admitting: Podiatry

## 2021-11-23 ENCOUNTER — Other Ambulatory Visit: Payer: Self-pay | Admitting: Podiatry

## 2021-11-23 MED ORDER — SODIUM CHLORIDE 0.9 % IV SOLN: 2.0000 g | Freq: Three times a day (TID) | INTRAVENOUS | 0 refills | Status: DC

## 2021-11-23 NOTE — Telephone Encounter (Signed)
Received messages from case management and on-call resident Dr. Malachy Mood from [facility] requesting assistance with orders for home care orders. I was asked to place an order for ancef 2g q8 hrs x 2 weeks so that patient could get this infusion. Order was placed. Will f/u with Dr. Malachy Mood to ensure that the order can be accepted - unfortunately there are issues with them being out of state and unable to provide home care orders.

## 2021-11-26 ENCOUNTER — Telehealth: Payer: Self-pay | Admitting: Podiatry

## 2021-11-26 NOTE — Telephone Encounter (Signed)
Boneta Lucks - from [facility] is faxing over orders for Home health care, she would like to touch base with you on the patients discharge and instructions.

## 2021-11-27 ENCOUNTER — Telehealth: Payer: Self-pay

## 2021-11-27 ENCOUNTER — Telehealth: Payer: Self-pay | Admitting: *Deleted

## 2021-11-27 NOTE — Telephone Encounter (Signed)
Christina Bryan with [facility] [phone] calling because she is starting care for home services and wanted to know if physician would like her to stop patient's current antibiotics before beginning the IV antibiotics. Please advise.

## 2021-11-27 NOTE — Telephone Encounter (Signed)
Mary with [facility] is also wanting a plan of care for patient 1 times a week for 4 weeks, instructions for wound care IV antibiotics. Please advise.

## 2021-11-27 NOTE — Telephone Encounter (Signed)
Received call from Corrie Dandy, RN with [facility] requesting clarification of orders. She states patient was discharged from [facility] and she is trying to get in touch with the office of Dr. Claudette Laws. Informed Corrie Dandy that he is not a provider at our office and we have no record of having seen the patient.   Sandie Ano, RN

## 2021-11-28 NOTE — Telephone Encounter (Signed)
Please advise 

## 2021-11-29 ENCOUNTER — Ambulatory Visit: Payer: Medicare Other | Admitting: Sports Medicine

## 2021-11-29 ENCOUNTER — Telehealth: Payer: Self-pay | Admitting: *Deleted

## 2021-11-29 NOTE — Telephone Encounter (Signed)
Patient had been scheduled to see Dr Marylene Land today at 3 but has cancelled

## 2021-11-29 NOTE — Telephone Encounter (Signed)
Called [facility] (Mary-nurse was not there but they will give her the mesage) giving recommendations that patient is in need of a f/u appointment per Dr Allena Katz and that patient has cancelled her appointment today at our office.

## 2021-11-29 NOTE — Telephone Encounter (Signed)
Called and spoke with Corrie Dandy w/ [facility] giving Dr Eliane Decree recommendations for a f/u to evaluate before giving orders for plan of care. I also explained that patient had cancelled her upcoming appointment scheduled for today. She verbalized understanding and said that she will contact the patient.

## 2021-11-30 ENCOUNTER — Ambulatory Visit (INDEPENDENT_AMBULATORY_CARE_PROVIDER_SITE_OTHER): Payer: Medicare Other | Admitting: Podiatry

## 2021-11-30 ENCOUNTER — Other Ambulatory Visit: Payer: Self-pay

## 2021-11-30 DIAGNOSIS — I209 Angina pectoris, unspecified: Secondary | ICD-10-CM

## 2021-11-30 DIAGNOSIS — E1010 Type 1 diabetes mellitus with ketoacidosis without coma: Secondary | ICD-10-CM | POA: Diagnosis not present

## 2021-11-30 DIAGNOSIS — L089 Local infection of the skin and subcutaneous tissue, unspecified: Secondary | ICD-10-CM | POA: Diagnosis not present

## 2021-11-30 DIAGNOSIS — Z89431 Acquired absence of right foot: Secondary | ICD-10-CM | POA: Diagnosis not present

## 2021-11-30 DIAGNOSIS — E11628 Type 2 diabetes mellitus with other skin complications: Secondary | ICD-10-CM | POA: Diagnosis not present

## 2021-12-03 ENCOUNTER — Other Ambulatory Visit: Payer: Self-pay | Admitting: Podiatry

## 2021-12-03 ENCOUNTER — Telehealth: Payer: Self-pay | Admitting: *Deleted

## 2021-12-03 MED ORDER — CEFAZOLIN SODIUM-DEXTROSE 2-4 GM/100ML-% IV SOLN: 2.0000 g | Freq: Three times a day (TID) | INTRAVENOUS | 0 refills | Status: DC

## 2021-12-03 NOTE — Telephone Encounter (Signed)
Victorino Dike w/ [facility] infusion [phone] calling for clarification on whether or not the antibiotic is supposed to stop today, pic line is scheduled to be removed today but patient told them that she is supposed to have one more week of the antibiotics.Please advise.

## 2021-12-03 NOTE — Telephone Encounter (Signed)
Patient is calling for a refill on an antibiotic(Cefazolin? Patient is not sure of the name), took her last one this morning and is due for another dosage by 3:00 today. Please advise.

## 2021-12-04 NOTE — Telephone Encounter (Signed)
Returned the call to patient to give information per Dr Allena Katz , said that Abigail from [facility]) called  today and said that the patient is scheduled to have Picc line removed today. Returned the call to [facility], no answer and could not leave a message.

## 2021-12-04 NOTE — Telephone Encounter (Signed)
Notified patient.

## 2021-12-05 ENCOUNTER — Telehealth: Payer: Self-pay | Admitting: *Deleted

## 2021-12-05 NOTE — Telephone Encounter (Signed)
Returned call to [employer] and spoke with Sharlynn Oliphant at [phone]), they want Dr Allena Katz to send orders to [facility] pharmacy ([phone])for flushes and supplies to maintain until central line is removed or until patient is seen at visit on Friday (12/07/21). Spoke with Corrie Dandy w/ [facility] giving  instructions per Dr Allena Katz that patient is supposed to go to ER or hospital to have the central line removed. She advised the patient to go to ER but patient has refused,said that she will wait until her upcoming appointment on Friday.According to her home health nurse(Mary)advised to patient the risk of infections and will complete a detailed note for their records. Returned call to [facility], will give message to Dr Lestine Mount he will contact patient to advise patient to go to ER or they will set up to have it remove at  Friday's appointment.

## 2021-12-05 NOTE — Telephone Encounter (Signed)
Mary with [facility] [phone] calling because the patient has a central line and this has to be removed in hospital, she is not being followed by infectious disease as well. Was informed that the PICC line has not been flushed since Monday due to not having any saline and that patient was discharged from hospital for less than desired circumstances. Please advise

## 2021-12-05 NOTE — Telephone Encounter (Signed)
Returned the call and spoke with nurse(Mary) giving Dr Eliane Decree instructions for central picc line removal.She verbalized understanding and will inform patient.

## 2021-12-06 NOTE — Progress Notes (Signed)
Subjective:  Patient ID: Christina Bryan, female    DOB: 1966/01/03,  MRN: [id number]  Chief Complaint  Patient presents with   Wound Check    Right foot wound care     55 y.o. female presents with the above complaint.  Patient presents with follow-up after undergoing surgery at [facility] on 11/19/2021.  Patient had excision of partial first and third metatarsal.  Patient also had biopsy done of the second metatarsal.  She also has a central line in place to receive antibiotics per infectious disease at [facility].  The last day of antibiotics of 12/02/2021.  She will is also scheduled to remove the central line Friday, 12/07/2021 at [facility].  She states she is doing well.  She denies any other acute complaints from the foot and ankle.  She still has a submetatarsal 3 ulceration to the right foot.   Review of Systems: Negative except as noted in the HPI. Denies N/V/F/Ch.  Past Medical History:  Diagnosis Date   Diabetes mellitus without complication (HCC)    Thyroid disease     Current Outpatient Medications:    acyclovir (ZOVIRAX) 400 MG tablet, acyclovir 400 mg tablet  TK 1 T PO  BID (Patient not taking: No sig reported), Disp: , Rfl:    aspirin EC 81 MG tablet, Take 1 tablet (81 mg total) by mouth daily. Swallow whole., Disp: 90 tablet, Rfl: 3   atorvastatin (LIPITOR) 20 MG tablet, atorvastatin 20 mg tablet (Patient taking differently: Take 20 mg by mouth every evening.), Disp: 90 tablet, Rfl: 3   B Complex Vitamins (B COMPLEX 1 PO), B Complex, Disp: , Rfl:    calcium carbonate (OSCAL) 1500 (600 Ca) MG TABS tablet, Take 1,200 mg of elemental calcium by mouth every evening., Disp: , Rfl:    ceFAZolin (ANCEF) 2-4 GM/100ML-% IVPB, Inject 100 mLs (2 g total) into the vein every 8 (eight) hours., Disp: 42 each, Rfl: 0   ceFAZolin 2 g in sodium chloride 0.9 % 100 mL, Inject 2 g into the vein every 8 (eight) hours., Disp: 42 Bag, Rfl: 0   insulin lispro (HUMALOG) 100 UNIT/ML  injection, Humalog U-100 Insulin 100 unit/mL subcutaneous solution  INJECT UP TO 50 UNITS UNDER THE SKIN DAILY WITH INSULIN PUMP, Disp: , Rfl:    levothyroxine (SYNTHROID) 100 MCG tablet, Take 100 mcg by mouth every evening., Disp: , Rfl:    lidocaine (LIDODERM) 5 %, lidocaine 5 % topical patch, Disp: , Rfl:    midodrine (PROAMATINE) 5 MG tablet, Take 1 tablet (5 mg total) by mouth daily as needed. For low BP, Disp: 60 tablet, Rfl: 3   Omega-3 Fatty Acids (FISH OIL) 1000 MG CAPS, Fish Oil, Disp: , Rfl:    omeprazole (PRILOSEC) 40 MG capsule, Take 40 mg by mouth every evening., Disp: , Rfl:   Social History   Tobacco Use  Smoking Status Former   Packs/day: 1.00   Years: 25.00   Pack years: 25.00   Types: Cigarettes   Quit date: 09/09/2019   Years since quitting: 2.2  Smokeless Tobacco Never    Allergies  Allergen Reactions   Aminoglycosides Nausea And Vomiting    Mycin Drugs   Cranberry Hives   Morphine And Related Itching   Triprolidine-Pseudoephedrine Hives   Objective:  There were no vitals filed for this visit. There is no height or weight on file to calculate BMI. Constitutional Well developed. Well nourished.  Vascular Dorsalis pedis pulses palpable bilaterally. Posterior tibial pulses  palpable bilaterally. Capillary refill normal to all digits.  No cyanosis or clubbing noted. Pedal hair growth normal.  Neurologic Normal speech. Oriented to person, place, and time. Epicritic sensation to light touch grossly present bilaterally.  Dermatologic Right submetatarsal 3 ulceration with fat layer exposed.  Does not probe down to deep bone.  Fiber granular wound base noted.  No malodor present no redness noted.  No clinical signs of purulent drainage noted.  Orthopedic: History of previous previous foot amputations/first and third met head resections   Radiographs: None Assessment:   1. Diabetic foot infection ([place])   2. History of amputation of right foot ([place])   3. Type  1 diabetes mellitus with ketoacidosis without coma ([place])    Plan:  Patient was evaluated and treated and all questions answered.  Right submetatarsal 3 ulceration with fat layer exposed -All questions and concerns were discussed with the patient in extensive detail. -Continue IV antibiotics until 12/02/2021.  Patient was told to go to the emergency room here or to [facility] to have the central line removed afterwards.  Patient states understanding -Patient has a history of partial first and third ray resection which may help with underlying wound. -I will take over the wound care management from the foot standpoint to help to heal the wound. -We will do Betadine wet-to-dry dressing change for now. -Partial weightbearing to the heel -Patient is scheduled to go to the [facility] on 12/07/2021 for follow-up visit and will see me afterwards for continuous care of the wound.   No follow-ups on file.

## 2021-12-11 ENCOUNTER — Encounter: Payer: Self-pay | Admitting: Cardiovascular Disease

## 2021-12-11 ENCOUNTER — Other Ambulatory Visit: Payer: Self-pay

## 2021-12-11 ENCOUNTER — Ambulatory Visit (INDEPENDENT_AMBULATORY_CARE_PROVIDER_SITE_OTHER): Payer: Medicare Other | Admitting: Cardiovascular Disease

## 2021-12-11 VITALS — BP 130/60 | HR 73 | Ht 67.5 in | Wt 163.2 lb

## 2021-12-11 DIAGNOSIS — I779 Disorder of arteries and arterioles, unspecified: Secondary | ICD-10-CM | POA: Diagnosis not present

## 2021-12-11 DIAGNOSIS — E782 Mixed hyperlipidemia: Secondary | ICD-10-CM | POA: Diagnosis not present

## 2021-12-11 DIAGNOSIS — I2510 Atherosclerotic heart disease of native coronary artery without angina pectoris: Secondary | ICD-10-CM

## 2021-12-11 DIAGNOSIS — I209 Angina pectoris, unspecified: Secondary | ICD-10-CM | POA: Diagnosis not present

## 2021-12-11 MED ORDER — ATORVASTATIN CALCIUM 40 MG PO TABS: ORAL_TABLET | ORAL | 5 refills | Status: DC

## 2021-12-11 NOTE — Progress Notes (Signed)
Cardiology Office Note   Date:  12/11/2021   ID:  Christina Bryan, DOB [birth year], MRN [id number]  PCP:  Adron Bene, MD  Cardiologist:  Dr. Mariah Milling  Chief Complaint  Patient presents with   Other    Post cath c/o PVC's. Meds reviewed verbally with pt.      History of Present Illness: Christina Bryan is a 55 y.o. female who was referred by Dr. Mariah Milling for evaluation and management of peripheral arterial disease.   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  She underwent cardiac CTA in September which showed a calcium score of 2343 with heavily calcified plaque in the RCA and LAD.  Cardiac catheterization was subsequently done by me in October which showed heavily calcified coronary arteries with moderate two-vessel coronary artery disease involving the LAD and RCA with no obstructive disease.  Ejection fraction was normal with mildly elevated left ventricular end-diastolic pressure.  The patient has a prolonged history of type 1 diabetes for 35 years.  She had previous right fifth toe amputation in 2016 with subsequent open wound.  She decided to seek a second opinion at [facility] where she underwent lower extremity arterial Doppler last month which showed normal ABI and TBI bilaterally.  She follows with podiatry there and the wound is improving.  She has no leg claudication.  She has not had any carotid Doppler since 2020.  She denies chest pain or shortness of breath.  Past Medical History:  Diagnosis Date   Diabetes mellitus without complication (HCC)    Thyroid disease     Past Surgical History:  Procedure Laterality Date   BACK SURGERY     CAROTID ENDARTERECTOMY     FOOT AMPUTATION      FOOT SURGERY Right    LEFT HEART CATH AND CORONARY ANGIOGRAPHY N/A 10/24/2021   Procedure: LEFT HEART CATH AND CORONARY ANGIOGRAPHY;  Surgeon: Iran Ouch, MD;  Location: [facility] INVASIVE CV LAB;  Service: Cardiovascular;  Laterality: N/A;     Current Outpatient Medications  Medication Sig Dispense Refill   acyclovir (ZOVIRAX) 400 MG tablet acyclovir 400 mg tablet  TK 1 T PO  BID     B Complex Vitamins (B COMPLEX 1 PO) B Complex     calcium carbonate (OSCAL) 1500 (600 Ca) MG TABS tablet Take 1,200 mg of elemental calcium by mouth every evening.     insulin lispro (HUMALOG) 100 UNIT/ML injection Humalog U-100 Insulin 100 unit/mL subcutaneous solution  INJECT UP TO 50 UNITS UNDER THE SKIN DAILY WITH INSULIN PUMP     levothyroxine (SYNTHROID) 100 MCG tablet Take 100 mcg by mouth every evening.     lidocaine (LIDODERM) 5 % lidocaine 5 % topical patch     midodrine (PROAMATINE) 5 MG tablet Take 1 tablet (5 mg total) by mouth daily as needed. For low BP 60 tablet 3   Omega-3 Fatty Acids (FISH OIL) 1000 MG CAPS Fish Oil     omeprazole (PRILOSEC) 40 MG capsule Take 40 mg by mouth every evening.     atorvastatin (LIPITOR) 40 MG tablet atorvastatin 20 mg tablet 30 tablet 5   No current facility-administered medications for this visit.    Allergies:   Aminoglycosides, Cranberry, Morphine and related, and Triprolidine-pseudoephedrine    Social History:  The patient  reports that she quit smoking about 2 years ago. Her smoking use included cigarettes. She has a 25.00 pack-year smoking history. She has never used smokeless tobacco. She reports current drug use. Drug: Marijuana. She reports that she does not drink alcohol.   Family History:  The patient's family history includes CAD in her father; Dementia in her mother.    ROS:  Please see the history of present illness.   Otherwise, review of systems are positive for none.   All other systems are reviewed and negative.    PHYSICAL  EXAM: VS:  BP 130/60 (BP Location: Left Arm, Patient Position: Sitting, Cuff Size: Normal)    Pulse 73    Ht 5' 7.5" (1.715 m)    Wt 163 lb 4 oz (74 kg)    SpO2 98%    BMI 25.19 kg/m  , BMI Body mass index is 25.19 kg/m. GEN: Well nourished, well developed, in no acute distress  HEENT: normal  Neck: no JVD or masses.  Faint right carotid bruit Cardiac: RRR; no murmurs, rubs, or gallops,no edema  Respiratory:  clear to auscultation bilaterally, normal work of breathing GI: soft, nontender, nondistended, + BS MS: no deformity or atrophy  Skin: warm and dry, no rash Neuro:  Strength and sensation are intact Psych: euthymic mood, full affect Vascular: Dorsalis pedis is palpable bilaterally Right radial pulses normal with no hematoma   EKG:  EKG is ordered today. The ekg ordered today demonstrates normal sinus rhythm without significant ST or T wave changes.   Recent Labs: 08/29/2021: ALT 14 09/18/2021: TSH 0.734 10/15/2021: BUN 16; Creatinine, Ser 1.21; Hemoglobin 11.3; Platelets 210; Potassium 4.3; Sodium 139    Lipid Panel    Component Value Date/Time   CHOL 146 09/18/2021 1617   TRIG 64 09/18/2021 1617   HDL 72 09/18/2021 1617   CHOLHDL 2.0 09/18/2021 1617   LDLCALC 61 09/18/2021 1617      Wt Readings from Last 3 Encounters:  12/11/21 163 lb 4 oz (74 kg)  10/24/21 165 lb (74.8 kg)  10/22/21 165 lb 9.6 oz (75.1 kg)       No flowsheet data found.    ASSESSMENT AND PLAN:  1.  Carotid artery disease, status post right carotid endarterectomy for severe asymptomatic stenosis.  Continue low-dose aspirin and treatment of risk factors.  She is due for follow-up carotid Doppler which I requested today.  2.  Coronary artery disease involving native coronary arteries without angina: I reviewed the results of recent cardiac catheterization with her.  She has moderate nonobstructive coronary artery disease.  Currently with no anginal symptoms.  I recommend aggressive treatment  of risk factors.  3.  Hyperlipidemia: Given heavily calcified coronary arteries and prolonged diabetes, I discussed with her the option of an LDL target below 55 based on recent recommendations.  I elected to increase atorvastatin to 40 mg daily.  Recheck lipid and liver profile in 2 months.  3.  Diabetic foot ulcer: No clear evidence of peripheral arterial disease contributing to this by exam and by recent lower extremity arterial Doppler.    Disposition:   FU with me in 1 year  Signed,  Lorine Bears, MD  12/11/2021 3:34 PM    [facility]

## 2021-12-11 NOTE — Patient Instructions (Signed)
Medication Instructions:  Your physician has recommended you make the following change in your medication:   INCREASE Atorvastatin to 40 mg daily.  *If you need a refill on your cardiac medications before your next appointment, please call your pharmacy*   Lab Work: Your physician recommends that you return for a FASTING lipid profile and lft: in 2 months  If you have labs (blood work) drawn today and your tests are completely normal, you will receive your results only by: MyChart Message (if you have MyChart) OR A paper copy in the mail If you have any lab test that is abnormal or we need to change your treatment, we will call you to review the results.   Testing/Procedures: Your physician has requested that you have a carotid duplex. This test is an ultrasound of the carotid arteries in your neck. It looks at blood flow through these arteries that supply the brain with blood. Allow one hour for this exam. There are no restrictions or special instructions.    Follow-Up: At [facility], you and your health needs are our priority.  As part of our continuing mission to provide you with exceptional heart care, we have created designated Provider Care Teams.  These Care Teams include your primary Cardiologist (physician) and Advanced Practice Providers (APPs -  Physician Assistants and Nurse Practitioners) who all work together to provide you with the care you need, when you need it.  We recommend signing up for the patient portal called "MyChart".  Sign up information is provided on this After Visit Summary.  MyChart is used to connect with patients for Virtual Visits (Telemedicine).  Patients are able to view lab/test results, encounter notes, upcoming appointments, etc.  Non-urgent messages can be sent to your provider as well.   To learn more about what you can do with MyChart, go to [email or web address].    Your next appointment:   Your physician wants you to follow-up in: 1 year  You will receive a reminder letter in the mail two months in advance. If you don't receive a letter, please call our office to schedule the follow-up appointment.   The format for your next appointment:   In Person  Provider:   Lorine Bears, MD{   Other Instructions N/A

## 2021-12-14 ENCOUNTER — Ambulatory Visit (INDEPENDENT_AMBULATORY_CARE_PROVIDER_SITE_OTHER): Payer: Medicare Other

## 2021-12-14 ENCOUNTER — Ambulatory Visit (INDEPENDENT_AMBULATORY_CARE_PROVIDER_SITE_OTHER): Payer: Medicare Other | Admitting: Podiatry

## 2021-12-14 ENCOUNTER — Other Ambulatory Visit: Payer: Self-pay

## 2021-12-14 DIAGNOSIS — E11628 Type 2 diabetes mellitus with other skin complications: Secondary | ICD-10-CM

## 2021-12-14 DIAGNOSIS — L089 Local infection of the skin and subcutaneous tissue, unspecified: Secondary | ICD-10-CM | POA: Diagnosis not present

## 2021-12-14 DIAGNOSIS — Z89431 Acquired absence of right foot: Secondary | ICD-10-CM

## 2021-12-14 DIAGNOSIS — E1010 Type 1 diabetes mellitus with ketoacidosis without coma: Secondary | ICD-10-CM

## 2021-12-19 NOTE — Progress Notes (Signed)
Subjective:  Patient ID: Christina Bryan, female    DOB: 31-Dec-1965,  MRN: [id number]  No chief complaint on file.   55 y.o. female presents with the above complaint.  Patient presents with follow-up after undergoing surgery at [facility] on 11/19/2021.  Patient had excision of partial first and third metatarsal.  Patient also had biopsy done of the second metatarsal.  She had the central line removed.  She does not need further antibiotics.  She is going to try and options to have the suture removed in the first week of January.   Review of Systems: Negative except as noted in the HPI. Denies N/V/F/Ch.  Past Medical History:  Diagnosis Date   Diabetes mellitus without complication (HCC)    Thyroid disease     Current Outpatient Medications:    acyclovir (ZOVIRAX) 400 MG tablet, acyclovir 400 mg tablet  TK 1 T PO  BID, Disp: , Rfl:    atorvastatin (LIPITOR) 40 MG tablet, atorvastatin 20 mg tablet, Disp: 30 tablet, Rfl: 5   B Complex Vitamins (B COMPLEX 1 PO), B Complex, Disp: , Rfl:    calcium carbonate (OSCAL) 1500 (600 Ca) MG TABS tablet, Take 1,200 mg of elemental calcium by mouth every evening., Disp: , Rfl:    insulin lispro (HUMALOG) 100 UNIT/ML injection, Humalog U-100 Insulin 100 unit/mL subcutaneous solution  INJECT UP TO 50 UNITS UNDER THE SKIN DAILY WITH INSULIN PUMP, Disp: , Rfl:    levothyroxine (SYNTHROID) 100 MCG tablet, Take 100 mcg by mouth every evening., Disp: , Rfl:    lidocaine (LIDODERM) 5 %, lidocaine 5 % topical patch, Disp: , Rfl:    midodrine (PROAMATINE) 5 MG tablet, Take 1 tablet (5 mg total) by mouth daily as needed. For low BP, Disp: 60 tablet, Rfl: 3   Omega-3 Fatty Acids (FISH OIL) 1000 MG CAPS, Fish Oil, Disp: , Rfl:    omeprazole (PRILOSEC) 40 MG capsule, Take 40 mg by mouth every evening., Disp: , Rfl:   Social History   Tobacco Use  Smoking Status Former   Packs/day: 1.00   Years: 25.00   Pack years: 25.00   Types: Cigarettes   Quit date:  09/09/2019   Years since quitting: 2.2  Smokeless Tobacco Never    Allergies  Allergen Reactions   Aminoglycosides Nausea And Vomiting    Mycin Drugs   Cranberry Hives   Morphine And Related Itching   Triprolidine-Pseudoephedrine Hives   Objective:  There were no vitals filed for this visit. There is no height or weight on file to calculate BMI. Constitutional Well developed. Well nourished.  Vascular Dorsalis pedis pulses palpable bilaterally. Posterior tibial pulses palpable bilaterally. Capillary refill normal to all digits.  No cyanosis or clubbing noted. Pedal hair growth normal.  Neurologic Normal speech. Oriented to person, place, and time. Epicritic sensation to light touch grossly present bilaterally.  Dermatologic Right submetatarsal 3 ulceration with fat layer exposed.  Does not probe down to deep bone.  Fiber granular wound base noted.  No malodor present no redness noted.  No clinical signs of purulent drainage noted.  Orthopedic: History of previous previous foot amputations/first and third met head resections   Radiographs: None Assessment:   1. Diabetic foot infection ([place])    Plan:  Patient was evaluated and treated and all questions answered.  Right submetatarsal 3 ulceration with fat layer exposed -All questions and concerns were discussed with the patient in extensive detail. -Patient had PICC line removed at [facility].  No  complication noted. -Patient has a history of partial first and third ray resection which may help with underlying wound. -I will take over the wound care management from the foot standpoint to help to heal the wound. -Collagen fibers with Mepilex dressing per Belva Bertin wound treatment. -Partial weightbearing to the heel -She is scheduled to go back on Friday, January 6 for suture removal.   No follow-ups on file.

## 2021-12-20 ENCOUNTER — Other Ambulatory Visit: Payer: Self-pay

## 2021-12-20 MED ORDER — MIDODRINE HCL 5 MG PO TABS: 5.0000 mg | ORAL_TABLET | Freq: Every day | ORAL | 0 refills | Status: AC | PRN

## 2021-12-25 ENCOUNTER — Other Ambulatory Visit: Payer: Self-pay

## 2021-12-25 MED ORDER — ATORVASTATIN CALCIUM 40 MG PO TABS: ORAL_TABLET | ORAL | 2 refills | Status: DC

## 2021-12-25 MED ORDER — ATORVASTATIN CALCIUM 40 MG PO TABS: 40.0000 mg | ORAL_TABLET | Freq: Every day | ORAL | 2 refills | Status: DC

## 2021-12-26 ENCOUNTER — Other Ambulatory Visit: Payer: Self-pay

## 2021-12-26 MED ORDER — ATORVASTATIN CALCIUM 40 MG PO TABS: 40.0000 mg | ORAL_TABLET | Freq: Every day | ORAL | 3 refills | Status: DC

## 2022-01-01 ENCOUNTER — Other Ambulatory Visit: Payer: Self-pay

## 2022-01-01 ENCOUNTER — Ambulatory Visit (INDEPENDENT_AMBULATORY_CARE_PROVIDER_SITE_OTHER): Payer: Medicare Other

## 2022-01-01 DIAGNOSIS — I779 Disorder of arteries and arterioles, unspecified: Secondary | ICD-10-CM | POA: Diagnosis not present

## 2022-01-01 DIAGNOSIS — R0989 Other specified symptoms and signs involving the circulatory and respiratory systems: Secondary | ICD-10-CM | POA: Diagnosis not present

## 2022-01-02 ENCOUNTER — Other Ambulatory Visit: Payer: Self-pay

## 2022-01-02 MED ORDER — ATORVASTATIN CALCIUM 40 MG PO TABS: 40.0000 mg | ORAL_TABLET | Freq: Every day | ORAL | 3 refills | Status: AC

## 2022-01-11 ENCOUNTER — Ambulatory Visit: Payer: Medicare Other | Admitting: Podiatry

## 2022-01-23 ENCOUNTER — Ambulatory Visit: Payer: Medicare Other | Admitting: Podiatry

## 2022-01-30 ENCOUNTER — Ambulatory Visit: Payer: Medicare Other | Admitting: Podiatry

## 2022-02-25 ENCOUNTER — Other Ambulatory Visit: Payer: Medicare Other

## 2022-03-04 NOTE — H&P (Signed)
-------------------------------------------------------------------------------  Attestation signed by Benson Setting, MD at 03/06/2022  6:34 PM  I personally saw and examined the patient. I discussed the patient with the Resident and agree with their note which accurately reflects my own findings and plan. In summary, 56 yo M s/p right foot metatarsectomy due to infection admitted post-op from OR closure. Plan to admit for IV antibiotics, diabetes control, wound care, and fu OR cultures.    Weldon Picking MD, MS       -------------------------------------------------------------------------------        [name] - THE [facility]    HISTORY AND PHYSICAL     [name] [name],[name] [name]   MRN: [id number]  DOB: 1966/08/27    Admit/Consult Date: 03/04/2022    Admitting/Consult Attending: Dr. Willa Rough     Surgical Service: Vascular surgery    History of Present Illness:  [name] [name] is a 56 y.o. female presenting with diabetic foot wound infection s/p resection 2nd metatarsal then resection 1,3 metatarsal heads then closure.        PMH:    Past Medical History:   Diagnosis Date   . Diabetes mellitus        PSH:    Past Surgical History:   Procedure Laterality Date   . CARDIAC CATHETERIZATION     . METATARSAL OSTECTOMY Right 11/15/2021    Procedure: Debridement of Foot, Resection of 2nd Metatarsal Head;  Surgeon: Joneen Caraway, DPM;  Location: [facility];  Service: Vascular;  Laterality: Right;   . METATARSAL OSTECTOMY Right 11/19/2021    Procedure: Excision, 1st and 3rd Metatarsals;  Surgeon: Joneen Caraway, DPM;  Location: [facility];  Service: Vascular;  Laterality: Right;       Social Hx:  Social History     Socioeconomic History   . Marital status: Married     Spouse name: Not on file   . Number of children: Not on file   . Years of education: Not on file   . Highest education level: Not on file   Occupational History   . Not on file   Tobacco Use   . Smoking status: Former      Types: Cigarettes   . Smokeless tobacco: Not on file   Substance and Sexual Activity   . Alcohol use: Not Currently   . Drug use: Yes     Types: Marijuana   . Sexual activity: Not Currently   Other Topics Concern   . Not on file   Social History Narrative   . Not on file     Social Determinants of Health     Financial Resource Strain: Not on file   Food Insecurity: Not on file   Transportation Needs: Not on file   Physical Activity: Not on file   Stress: Not on file   Social Connections: Not on file   Intimate Partner Violence: Not on file   Housing Stability: Not on file     Social History     Social History Narrative   . Not on file       Family Hx:   No family history on file.    Allergies:    Allergies/Adverse Reactions/Intolerances   Allergen Reactions   . Cranberry Hives     Cerner Allergy Text Annotation: Cranberry; pt states she has been drinking cranberry juice this admission with no allergic reaction   . Triprolidine-Pseudoephedrine Hives and Other (see comments)     "Actifed". Insomnia   .  Aminoglycosides Nausea and vomiting     Mycin Drugs   . Pseudoephedrine    . Vancomycin Analogues Nausea and vomiting   . Morphine Hives and Itching       Medications:  . cefTRIAXone  1 g Intravenous Once     OR anesthesia builder    Problem List:  Patient Active Problem List   Diagnosis Code   . Cellulitis, unspecified cellulitis site L03.90   . 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 Type 1 diabetes mellitus with diabetic polyneuropathy E10.42   . Metatarsalgia of right foot M77.41   . S/P amputation of lesser toe, right Z89.421   . Other osteomyelitis of right foot M86.8X7   . Acute osteomyelitis of right ankle or foot M86.171   . Osteomyelitis due to Staphylococcus aureus M86.9, A49.01   . Diabetic foot infection E11.628, L08.9   . Nausea and vomiting, unspecified vomiting type R11.2   . Hyperglycemia R73.9   . MSSA infection,  non-invasive A49.01   . Insulin pump in place Z96.41   . Stage I pressure ulcer of right heel L89.611       ROS:   General, no fevers or chills. Respiratory, no shortness of breath. Cardiovascular, no chest pain.   All other systems are negative.    Major Findings:  BP 120/73   Pulse 76   Temp 36.4 C (97.5 F) (Temporal)   Resp 20   Ht 1.715 m (5' 7.5")   Wt 72.6 kg (160 lb)   SpO2 100%   BMI 24.69 kg/m        Gen: NAD. Well developed, well nourished.   Pulm: No respiratory distress.   Skin: incisions are well healed, no signs of dehiscence, no swelling, no odor, no pain on palpation, no drainage, no erythema.   Musculoskeletal: alignment and gait unstable   Neuro: Awake & oriented x3.   Psych: Appropriate behavior, normal affect, appropriate mood      OTHER FINDINGS:   Labs:   Admission on 03/04/2022   Component Date Value Ref Range Status   . White Blood Cell Count 03/04/2022 6.25  4.50 - 11.00 K/cu mm Final   . Red Blood Cell Count 03/04/2022 3.38 (L)  4.00 - 5.20 M/cu mm Final   . Hemoglobin 03/04/2022 10.3 (L)  12.0 - 15.0 g/dL Final   . Hematocrit 91/47/8295 30.9 (L)  36.0 - 46.0 % Final   . Mean Corpuscular Volume 03/04/2022 91.4  80.0 - 100.0 fL Final   . Mean Corpus Hgb 03/04/2022 30.5  26.0 - 34.0 pg Final   . Mean Corpus Hgb Conc 03/04/2022 33.3  31.0 - 37.0 g/dL Final   . RBC Distribution Width 03/04/2022 13.6  11.5 - 14.5 % Final   . Platelet Count 03/04/2022 204  150 - 350 K/cu mm Final   . Mean Platelet Volume 03/04/2022 11.7  9.2 - 12.7 fL Final   . Nucleated RBC Number 03/04/2022 0.00  0.00 - 0.01 K/cu mm Final   . Sodium 03/04/2022 137  135 - 148 mmol/L Final   . Potassium 03/04/2022 4.6  3.5 - 5.1 mmol/L Final   . Chloride 03/04/2022 103  96 - 109 mmol/L Final   . Carbon Dioxide 03/04/2022 24  21 - 31 mmol/L Final   . Urea Nitrogen 03/04/2022 16  7 - 22 mg/dL Final   . Glucose 62/13/0865 183 (H)  71 - 99  mg/dL Final   . Calcium 54/08/8118 9.9  8.4 - 10.5 mg/dL Final   . Anion Gap  14/78/2956 10  7 - 16 mmol/L Final   . BUN / Creatinine Ratio 03/04/2022 13   Final   . eGFR -CKD-EPI Creatinine (2021) 03/04/2022 53 (L)  >60 mL/min/1.73 sqm Final    In accordance with recommendations by the [facility], eGFR is calculated using the CKD-EPI Creatinine Equation (2021), which is based on age and sex, and is independent of race (Inker et al. Kavin Leech, 2021).     . Creatinine 03/04/2022 1.2  0.5 - 1.2 mg/dL Final   . POCT Instrument Name 03/04/2022 [facility]   Final   . POCT Glucose 03/04/2022 202 (H)  71 - 99 mg/dL Final         ASSESSMENT/PLAN:  [name] [name] is a 56 y.o. female presenting with diabetic foot wound infection s/p resection 2nd metatarsal then resection 1,3 metatarsal heads then closure here for debridement and resection of right 4th metatarsal head.     - Admit to Vascular Surgery under Dr. Willa Rough  - Vital signs/Neurovascular checks: q4h floor   - Diet: CC   - Activity: WBAT RLE       - Restart home meds after reconciliation has been performed  - Nausea meds as needed PRN  - Pain meds as needed PRN  - Antibiotics Ceftriaxone and flagyl   - Cx sent     Please page the Vascular service pager [phone], or consult pager [phone] with further questions or concerns.    Signed:  Garret Reddish, MD  on 03/04/2022 at 2:05 PM

## 2022-03-06 NOTE — Discharge Summary (Signed)
[facility] VASCULAR SURGERY - THE [facility]    DISCHARGE SUMMARY     [name] [name]  Address: [address]  [place] [address]  MRN: [id number]  DOB: 12/07/1966  Acct#: [id number]  Admission Date: 03/04/2022  Discharge Date: 03/06/2022   Length of Stay: 2  Discharge Attending: Benson Setting, MD Telephone number: [phone]  Discharge Service: Vascular Surgery  Discharge Unit: [facility]W  PCP: Adron Bene, MD    Problems:  Principal Problem:    Diabetic foot infection  Active Problems:    Diabetic ulcer of right midfoot associated with type 1 diabetes mellitus, with fat layer exposed  Resolved Problems:    * No resolved hospital problems. *         Reason for Hospitalization:   Diabetic foot infection    Procedures:  03/04/22:  Excision of presumptive osteomyelitis of fourth metatarsal right foot.    Consultations:   CONSULT TO INPATIENT DIABETES MANAGEMENT SERVICE (IDMS) - SERVICE PAGR  IP CONSULT TO DIETITIAN/NUTRITION  Physical therapy     History of Present Illness/Hospital Course:  [name] [name] is a 56 y.o. female with a history of type 1 diabetes presenting with diabetic foot wound infection s/p resection 2nd metatarsal then resection 1,3 metatarsal heads then closure here in November 2022.   She presented this time for debridement and resection of right 4th metatarsal head.     On 03/04/22, the patient was taken to the operating room, where Dr. Lahoma Rocker performed the above referenced procedure(s). The patient tolerated the procedure well, was not intubated in the OR, and transferred to the floor in stable condition.     The endocrinology team was consulted to assist with insulin management.  Insulin pump orders were placed however patient's blood sugar rose to 539 on the vening after surgery.  The patient became nauseous and vomited.  She was worked up for DKA and an insulin drip was initiated.  She was monitored closely and the insulin drip was stopped on the evening  of post op day 1.     Throughout her hospitalization, the patient remained hemodynamically stable, dressings remained clean, dry, and intact without evidence of infection, hematoma, or seroma, and she was able to void spontaneously without a Foley catheter    By 03/06/22, the patient was tolerating a carbohydrate controlled diet, ambulating independently, and her pain was well controlled on oral pain medication. Her blood sugar readings were between 150 and 310.  The patient was subsequently discharged to home with plan for follow-up as outlined below and instructions to resume her insulin pump on 5 pm the day of discharge.        Functional Status and Exam at Discharge:   Temp: 36.9 C (98.4 F), Heart Rate: 84, Resp: 18, BP: 131/80, SpO2: 100 %, O2 Device: None-room air, Pain Score: 4  General: Awake, alert, and oriented x 3, in no acute distress.  HEENT: Oropharynx is clear and moist, trachea is midline.  Lungs: unlabored breathing on RA  Abdomen: Soft, non-tender, non-distended. No pulsatile masses.  Neuro: No gross deficits. + Peripheral neuropathy.  Extremities: Warm and well perfused, without edema. Right foot dressing c/d/i  Skin: No rashes or erythema.   Vascular Exam: PULSES-deferred today    Most Recent Labs:  CBC  Lab Results   Component Value Date    WBC 6.25 03/04/2022    RBC 3.38 (L) 03/04/2022    HGB 10.3 (L) 03/04/2022    HCT 30.9 (L)  03/04/2022    MCV 91.4 03/04/2022    MCHC 33.3 03/04/2022    MCH 30.5 03/04/2022    RDW 13.6 03/04/2022    PLT 204 03/04/2022    NRBC 0.00 03/04/2022       BMP  Lab Results   Component Value Date    NA 144 03/05/2022    K 4.1 03/05/2022    CL 109 03/05/2022    CO2 26 03/05/2022    BUN 19 03/05/2022    CREATININE 1.4 (H) 03/05/2022    GLU 141 (H) 03/05/2022    CALCIUM 9.7 03/05/2022    ANIONGAP 9 03/05/2022        Micro:  Fluid Cultures:  All Microbiology Results:   Microbiology Results (Last 30 Days)       Procedure Component Value Units Date/Time    SARS-CoV-2 NAT,  Asymptomatic [[id number]]  (Normal) Collected: 03/04/22 1732    Order Status: Completed Specimen: Respiratory, Nasal Swab Updated: 03/04/22 2041     SARS-COV-2 No RNA Detected     Comment: No SARS-CoV-2 RNA detected by PCR.    The Neu[employer]Dx SARS-CoV-2 assay is designed to detect highly conserved regions of two SARS-CoV-2 genes, the non-structural protein 2 (Nsp2) gene and the N gene. A No RNA Detected result does not preclude the possibility of COVID-19 infection since the adequacy of sample collection and/or low viral burden may result in the presence of viral nucleic acids below the analytical sensitivity of this test method. Test results should be used along with other clinical and laboratory data in making the diagnosis.    This test has received FDA Emergency Use Authorization and has been verified by the [facility] Microbiology laboratory. This test is only authorized for the duration of the declaration and the circumstances that exist to justify the authorization of the emergency use of in vitro diagnostic tests for the detection of SARS-CoV-2 virus and/or diagnosis of COVID-19 infection under section 564(b)(1) of the Act, 21 U.S.C. 884ZYS-0(Y)(3), unless the authorization is terminated or revoked sooner.    The [facility] Microbiology Labs are certified under CLIA-88 as qualified to perform high complexity testing.  This testing was performed in the [facility] Microbiology laboratory located at [address], [place]   [address] (CLIA License Numbers: [id number], [id number], [id number]; CAP number: [id number] and [place] State license numbers [phone] [id number]).    Additional information about testing can be found on the Medical Microbiology page of the [facility] Medicine Pathology website. Copy and paste this link for direct access to this page: [email or web address]         Bacterial Culture, NOT Urine/Blood/Resp - Anaerobic/Aerobic  [[id number]]  (Normal) Collected: 03/04/22 1327    Order Status: Completed Specimen: Abscess, Foot Right Updated: 03/06/22 1259     Aerobic/Anaerobic Misc Culture Very Light Mixed Skin Flora     Gram stain Very Light Polymorphonuclear leukocytes      No organisms seen    Fungal Culture [[id number]] Collected: 03/04/22 1327    Order Status: Sent Specimen: Abscess, Foot Right Updated: 03/04/22 1414    Narrative:      The following orders were created for panel order Fungal Culture.  Procedure                               Abnormality         Status                     ---------                               -----------         ------  Fungal Culture with Micr.Marland KitchenMarland Kitchen[[id number]]                      Preliminary result           Please view results for these tests on the individual orders.    Bacterial Culture, NOT Urine/Blood/Resp - Anaerobic/Aerobic [[id number]]  (Normal) Collected: 03/04/22 1327    Order Status: Completed Specimen: Tissue, Right Foot Updated: 03/05/22 0710     Aerobic/Anaerobic Misc Culture No growth to date     Gram stain No polymorphonuclear leukocytes or organisms seen    Fungal Culture with Microscopic [[id number]] Collected: 03/04/22 1327    Order Status: Completed Specimen: Abscess, Foot Right Updated: 03/05/22 0865     Fungal Culture No growth of fungus to date     Fungal Microscopic exam No fungus seen by calcofluor white stain.    SARS-CoV-2 NAT, Asymptomatic [[id number]]     Order Status: No result Specimen: Respiratory, Nasal Swab     Bacterial Culture, NOT Urine/Blood/Resp [[id number]] Collected: 02/15/22 1300    Order Status: Canceled Specimen: Wound, Foot Right     Bacterial Culture, NOT Urine/Blood/Resp [[id number]]  (Abnormal)  (Susceptibility) Collected: 02/15/22 1300    Order Status: Completed Specimen: Wound, Foot Right Updated: 02/20/22 1555     Aerobic Misc Culture Positive Culture*      Very Light growth Pseudomonas aeruginosa*      Very Light growth Streptococcus group  C/G (Large colony)*     Comment: Penicillin is the drug of choice for treatment of -hemolytic streptococcal infections. No antimicrobial susceptibilities to follow.  .             1 Colony Pseudomonas aeruginosa*     Comment: Second morphology          Gram stain Microscopic not performed; Insufficient material    Susceptibility        Pseudomonas aeruginosa     Amikacin <=8 ug/mL Susceptible     Aztreonam 8 ug/mL Susceptible     Cefepime 4 ug/mL Susceptible     Ceftazidime 4 ug/mL Susceptible     Ciprofloxacin <=0.25 ug/mL Susceptible     Gentamicin <=2 ug/mL Susceptible     Meropenem <=0.5 ug/mL Susceptible     Piperacillin-Tazobactam 8/4 ug/mL Susceptible     Tobramycin <=2 ug/mL Susceptible                      Susceptibility        Pseudomonas aeruginosa     Amikacin <=8 ug/mL Susceptible     Aztreonam 8 ug/mL Susceptible     Cefepime 4 ug/mL Susceptible     Ceftazidime 4 ug/mL Susceptible     Ciprofloxacin <=0.25 ug/mL Susceptible     Gentamicin <=2 ug/mL Susceptible     Meropenem <=0.5 ug/mL Susceptible     Piperacillin-Tazobactam 8/4 ug/mL Susceptible     Tobramycin <=2 ug/mL Susceptible                                    Past Medical History:   Diagnosis Date   . Diabetes mellitus        Allergies:  Allergies/Adverse Reactions/Intolerances   Allergen Reactions   . Cranberry Hives     Cerner Allergy Text Annotation: Cranberry; pt states she has been drinking cranberry juice this admission with no allergic reaction   . Triprolidine-Pseudoephedrine Hives and Other (see  comments)     "Actifed". Insomnia   . Aminoglycosides Nausea and vomiting     Mycin Drugs   . Pseudoephedrine    . Vancomycin Analogues Nausea and vomiting   . Morphine Hives and Itching       Medications:           Medication List           * If you have questions about this list or if there are differences between this list and the labels on the medications you are using at home, please ask your pharmacist or provider.                STOP  taking these medications      ondansetron 4 MG tablet  Commonly known as: ZOFRAN            START taking these medications        Dose Instructions   acetaminophen 325 MG tablet  Last time this was given: 650 mg on March 06, 2022  4:24 AM  Commonly known as: TYLENOL   Take 2 tablets (650 mg total) by mouth every 4 (four) hours for 10 days.     ciprofloxacin HCl 500 MG tablet  Commonly known as: CIPRO   Take 1 tablet (500 mg total) by mouth every 12 (twelve) hours for 14 days.     cyclobenzaprine 5 MG tablet  Commonly known as: FLEXERIL   Take 1 tablet (5 mg total) by mouth 3 (three) times daily as needed for Muscle spasms.     gabapentin 100 MG capsule  Last time this was given: 100 mg on March 05, 2022  8:34 PM  Commonly known as: NEURONTIN   Take 1 capsule (100 mg total) by mouth 3 (three) times daily.     promethazine 25 MG tablet  Last time this was given: 25 mg on March 05, 2022  6:26 PM  Commonly known as: PHENERGAN   Take 1 tablet (25 mg total) by mouth every 6 (six) hours as needed.            CONTINUE taking these medications        Dose Instructions   aspirin 81 mg chewable tablet  Last time this was given: 81 mg on March 05, 2022  8:27 AM   Take 1 tablet (81 mg total) by mouth daily.     atorvastatin 40 MG tablet  Last time this was given: 40 mg on March 05, 2022  8:33 PM  Commonly known as: LIPITOR   Take 1 tablet (40 mg total) by mouth daily.     calcium carbonate 600 mg calcium (1,500 mg) tablet  Commonly known as: OS-CAL   Take 1 tablet (1,500 mg total) by mouth.     cholecalciferol 50 mcg (2,000 unit) capsule  Commonly known as: Vitamin D3   Take 1 capsule (2,000 Units total) by mouth daily.     insulin lispro 100 unit/mL injection  Commonly known as: HumaLOG   Inject under the skin 3 (three) times daily before meals.     LEVOthyroxine 100 MCG tablet  Last time this was given: 100 mcg on March 06, 2022  6:21 AM  Commonly known as: SYNTHROID   Take by mouth.     midodrine 5 MG tablet  Commonly known as:  PROAMATINE   Take by mouth.     omega-3 acid ethyl esters 1 gram capsule  Commonly known as: LOVAZA   Take 1  capsule (1 g total) by mouth.     omeprazole 40 mg DR capsule  Commonly known as: PRILOSEC   Take by mouth.     VITAMIN B COMPLEX ORAL   Take 1 tablet by mouth.     vitamin E 180 mg (400 unit) Cap capsule   Take 1 capsule (400 Units total) by mouth.              Disposition:  Home    Patient Instructions on Discharge:  Discharge diet:Carbohydrate controlled.    Activities:Limited ambulation. IF YOU HAVE A FOOT WOUND, THEN Absolutely NO stairs, Absolutely NO stationary bicycle, Absolutely NO extended standing, and Absolutely NO ambulating long distances.     Restrictions:No driving while on pain medication and No swimming.    Weight bearing: household ambulation.   Do not advance activity beyond this until approved by surgeon     Specialty Foot/legwear: Specialty shoe .    Personal Treatment and Care: You may shower or sponge bathe howver foot wound cannot get wet after surgery. If you have a wound or incision on the foot do not get that foot wet    Stitches/staples to be removed:at clinic in 3-4 weeks or as otherwise directed by the surgery team.    Incision/Wound Care:     Number of incisions/wounds: 2.    Incision/Wound site: Plantar ulcer:  Apply Aquacel.  Cover with dry sterile dressing.  Change every other day.      Incision:  Apply dry sterile dressing.  Wrap with kerlix and coban dressing.  Change every other day.        Other Post-Discharge Care: None.         Educational Handouts: Per nursing.    Home care services:  Will the patient need home care services: No.    Which services are needed:Not applicable.    When to call your doctor:  For life threatening symptoms such as shortness of breath, chest pain and signs of stroke (such as sudden numbness or weakness of the face, arm or leg especially on one side of the body, sudden confusion, trouble speaking or understanding, sudden trouble seeing in one  or both eyes, sudden trouble walking, dizziness, loss of balance or coordination, sudden severe headache with no known cause) call 911 or go to the nearest Emergency Room.    Call your Doctor if: You have a temperature over 101F, You have redness, swelling, pus, and/or drainage, Severe or increasing pain not relieved by your pain medication, Severe or persistent nausea and/or vomiting, and Changes in the color, temperature, and sensation in the legs.     Other reasons to call your Doctor: For any other questions or concerns related to this admission.    During office hours, call your surgeons office at [phone]. After hours call [phone] and ask for the vascular surgery provider on call.      Items Needing Follow Up:  Pending Labs       Order Current Status    Surgical pathology exam: Routine In process    Bacterial Culture, NOT Urine/Blood/Resp - Anaerobic/Aerobic Preliminary result    Bacterial Culture, NOT Urine/Blood/Resp - Anaerobic/Aerobic Preliminary result    Fungal Culture Preliminary result    Fungal Culture with Microscopic Preliminary result               Follow-Up Appointments:      Future Appointments (up to 10 shown)      Mar 20, 2022 10:30 AM  (Arrive  by 10:00 AM)  Postop with Joneen Caraway, DPM  [facility] Vascular Surgery (The [facility] (Facility Fee/[place] [facility] [place] [address] [place] [address] [place] [address]  [phone]   Please arrive early to allow time for registration.            For full detail of all procedures, imaging studies, and laboratory findings from this encounter please access [facility] at [email or web address]. You may also access this information via [facility] at [email or web address].     Note: I have reviewed and updated, if indicated, any information recalled into this document from a prior document.     Jennie B McKown, PA-C   *Some images could not be shown.

## 2022-05-11 ENCOUNTER — Emergency Department (HOSPITAL_BASED_OUTPATIENT_CLINIC_OR_DEPARTMENT_OTHER)
Admission: EM | Admit: 2022-05-11 | Discharge: 2022-05-11 | Disposition: A | Discharge status: Home / Self Care | Payer: Medicare Other | Attending: Emergency Medicine | Admitting: Emergency Medicine

## 2022-05-11 ENCOUNTER — Encounter (HOSPITAL_BASED_OUTPATIENT_CLINIC_OR_DEPARTMENT_OTHER): Payer: Self-pay

## 2022-05-11 ENCOUNTER — Emergency Department (HOSPITAL_BASED_OUTPATIENT_CLINIC_OR_DEPARTMENT_OTHER): Payer: Medicare Other

## 2022-05-11 ENCOUNTER — Other Ambulatory Visit: Payer: Self-pay

## 2022-05-11 DIAGNOSIS — E109 Type 1 diabetes mellitus without complications: Secondary | ICD-10-CM | POA: Diagnosis not present

## 2022-05-11 DIAGNOSIS — L03031 Cellulitis of right toe: Secondary | ICD-10-CM

## 2022-05-11 DIAGNOSIS — R2241 Localized swelling, mass and lump, right lower limb: Secondary | ICD-10-CM | POA: Diagnosis present

## 2022-05-11 DIAGNOSIS — Z794 Long term (current) use of insulin: Secondary | ICD-10-CM | POA: Insufficient documentation

## 2022-05-11 LAB — CBC
HCT: 27.7 % — ABNORMAL LOW (ref 36.0–46.0)
Hemoglobin: 9 g/dL — ABNORMAL LOW (ref 12.0–15.0)
MCH: 29.6 pg (ref 26.0–34.0)
MCHC: 32.5 g/dL (ref 30.0–36.0)
MCV: 91.1 fL (ref 80.0–100.0)
Platelets: 233 10*3/uL (ref 150–400)
RBC: 3.04 MIL/uL — ABNORMAL LOW (ref 3.87–5.11)
RDW: 13.3 % (ref 11.5–15.5)
WBC: 10.1 10*3/uL (ref 4.0–10.5)
nRBC: 0 % (ref 0.0–0.2)

## 2022-05-11 LAB — BASIC METABOLIC PANEL
Anion gap: 7 (ref 5–15)
BUN: 19 mg/dL (ref 6–20)
CO2: 27 mmol/L (ref 22–32)
Calcium: 9.6 mg/dL (ref 8.9–10.3)
Chloride: 101 mmol/L (ref 98–111)
Creatinine, Ser: 1.21 mg/dL — ABNORMAL HIGH (ref 0.44–1.00)
GFR, Estimated: 53 mL/min — ABNORMAL LOW (ref >60)
Glucose, Bld: 116 mg/dL — ABNORMAL HIGH (ref 70–99)
Potassium: 4.2 mmol/L (ref 3.5–5.1)
Sodium: 135 mmol/L (ref 135–145)

## 2022-05-11 MED ORDER — CIPROFLOXACIN IN D5W 400 MG/200ML IV SOLN
400.0000 mg | Freq: Once | INTRAVENOUS | Status: AC
  Administered 2022-05-11: 400 mg via INTRAVENOUS
  Filled 2022-05-11: qty 200

## 2022-05-11 MED ORDER — KETOROLAC TROMETHAMINE 30 MG/ML IJ SOLN
30.0000 mg | Freq: Once | INTRAMUSCULAR | Status: AC
  Administered 2022-05-11: 30 mg via INTRAVENOUS
  Filled 2022-05-11: qty 1

## 2022-05-11 MED ORDER — TRAMADOL HCL 50 MG PO TABS
50.0000 mg | ORAL_TABLET | Freq: Once | ORAL | Status: AC
  Administered 2022-05-11: 50 mg via ORAL
  Filled 2022-05-11: qty 1

## 2022-05-11 MED ORDER — DOXYCYCLINE HYCLATE 100 MG PO CAPS: 100.0000 mg | ORAL_CAPSULE | Freq: Two times a day (BID) | ORAL | 0 refills | Status: DC

## 2022-05-11 MED ORDER — DOXYCYCLINE HYCLATE 100 MG PO TABS
100.0000 mg | ORAL_TABLET | Freq: Once | ORAL | Status: AC
  Administered 2022-05-11: 100 mg via ORAL
  Filled 2022-05-11: qty 1

## 2022-05-11 MED ORDER — CIPROFLOXACIN HCL 500 MG PO TABS: 500.0000 mg | ORAL_TABLET | Freq: Two times a day (BID) | ORAL | 0 refills | Status: DC

## 2022-05-11 NOTE — ED Provider Notes (Signed)
?[facility] EMERGENCY DEPT ?Provider Note ? ? ?CSN: [id number] ?Arrival date & time: 05/11/22  0032 ? ?  ? ?History ? ?Chief Complaint  ?Patient presents with  ? Wound Check  ? ? ?[name] [name] is a 56 y.o. female. ? ?Patient is a 57 year old female with history of type 1 diabetes, peripheral neuropathy, and recurrent diabetic foot ulcers.  Patient presenting today with complaints of drainage and swelling in her foot.  Patient reports waking up several nights ago in the night.  She walked across the floor in the dark and stepped on an electric cord.  The next day, she developed increased swelling and drainage to the bottom of the foot.  She denies to me that she is having pain, but tells me that she has no feeling in her feet secondary to her neuropathy.  She has been treated by specialist at [facility] for these issues and is due to see them next week.  She denies to me she is having any fevers or chills. ? ?The history is provided by the patient.  ?Wound Check ?This is a new problem. The current episode started yesterday. The problem occurs constantly. The problem has been rapidly worsening. Nothing aggravates the symptoms. Nothing relieves the symptoms.  ? ?  ? ?Home Medications ?Prior to Admission medications   ?Medication Sig Start Date End Date Taking? Authorizing Provider  ?acyclovir (ZOVIRAX) 400 MG tablet acyclovir 400 mg tablet ? TK 1 T PO  BID 03/15/20   [provider]  ?atorvastatin (LIPITOR) 40 MG tablet Take 1 tablet (40 mg total) by mouth daily. 01/02/22   Iran Ouch, MD  ?B Complex Vitamins (B COMPLEX 1 PO) B Complex    [provider]  ?calcium carbonate (OSCAL) 1500 (600 Ca) MG TABS tablet Take 1,200 mg of elemental calcium by mouth every evening.    [provider]  ?insulin lispro (HUMALOG) 100 UNIT/ML injection Humalog U-100 Insulin 100 unit/mL subcutaneous solution ? INJECT UP TO 50 UNITS UNDER THE SKIN DAILY WITH INSULIN PUMP    [provider]  ?levothyroxine (SYNTHROID) 100 MCG tablet Take 100 mcg by mouth every evening.    [provider]  ?lidocaine (LIDODERM) 5 % lidocaine 5 % topical patch    [provider]  ?midodrine (PROAMATINE) 5 MG tablet Take 1 tablet (5 mg total) by mouth daily as needed. For low BP 12/20/21   Antonieta Iba, MD  ?Omega-3 Fatty Acids (FISH OIL) 1000 MG CAPS Fish Oil    [provider]  ?omeprazole (PRILOSEC) 40 MG capsule Take 40 mg by mouth every evening.    [provider]  ?   ? ?Allergies    ?Aminoglycosides, Cranberry, Morphine and related, and Triprolidine-pseudoephedrine   ? ?Review of Systems   ?Review of Systems  ?All other systems reviewed and are negative. ? ?Physical Exam ?Updated Vital Signs ?BP (!) 166/90   Pulse 87   Temp 98.3 ?F (36.8 ?C) (Oral)   Resp 18   SpO2 99%  ?Physical Exam ?Vitals and nursing note reviewed.  ?Constitutional:   ?   General: She is not in acute distress. ?   Appearance: Normal appearance. She is not ill-appearing.  ?Pulmonary:  ?   Effort: Pulmonary effort is normal.  ?Musculoskeletal:  ?   Comments: To the bottom of the right foot, there is an area of blistering just proximal to the MTP joint.  There is purulent drainage coming from this.  ?Skin: ?  General: Skin is warm and dry.  ?Neurological:  ?   Mental Status: She is alert.  ? ? ?ED Results / Procedures / Treatments   ?Labs ?(all labs ordered are listed, but only abnormal results are displayed) ?Labs Reviewed  ?CBC - Abnormal; Notable for the following components:  ?    Result Value  ? RBC 3.04 (*)   ? Hemoglobin 9.0 (*)   ? HCT 27.7 (*)   ? All other components within normal limits  ?BASIC METABOLIC PANEL - Abnormal; Notable for the following components:  ? Glucose, Bld 116 (*)   ? Creatinine, Ser 1.21 (*)   ? GFR, Estimated 53 (*)   ? All other components within normal limits  ?AEROBIC/ANAEROBIC CULTURE W GRAM STAIN (SURGICAL/DEEP WOUND)  ? ? ?EKG ?None ? ?Radiology ?DG  Foot Complete Right ? ?Result Date: 05/11/2022 ?CLINICAL DATA:  Pain EXAM: RIGHT FOOT COMPLETE - 3+ VIEW COMPARISON:  12/14/2021 FINDINGS: Prior section of the right 5th toe including metatarsal. Prior resection of the 1st through 4th distal metatarsals. Screw fixation across the PIP joints of the 2nd through 4th toes. No evidence of acute osteomyelitis. Soft tissues are intact. IMPRESSION: No acute bony abnormality. Electronically Signed   By: Charlett Nose M.D.   On: 05/11/2022 03:11   ? ?Procedures ?Procedures  ? ? ?Medications Ordered in ED ?Medications - No data to display ? ?ED Course/ Medical Decision Making/ A&P ? ?Patient with history of recurrent infections related to diabetes.  She presents today with complaints of drainage and swelling to the bottom of her foot after stepping on an electric cord 2 nights ago.  She has purulent drainage coming from the soft tissues at the distal plantar surface along with surrounding erythema.  Wound culture was obtained and patient was given IV Cipro and oral doxycycline.  Cipro was chosen as her cultures in the past grew Pseudomonas susceptible to Cipro.  I also prescribed doxycycline for coverage of MRSA. ? ?Patient has an upcoming appointment with her wound care specialist at [facility] and I have advised her to keep this appointment.  She will be prescribed Cipro and Doxy. ? ?Final Clinical Impression(s) / ED Diagnoses ?Final diagnoses:  ?None  ? ? ?Rx / DC Orders ?ED Discharge Orders   ? ? None  ? ?  ? ? ?  ?Geoffery Lyons, MD ?05/11/22 [phone] ? ?

## 2022-05-11 NOTE — ED Provider Notes (Signed)
?[facility] EMERGENC[facility] DEPT ?Provider Note ? ? ?CSN: [id number] ?Arrival date & time: 05/11/22  0032 ? ?  ? ?History ? ?Chief Complaint  ?Patient presents with  ? Wound Check  ? ? ?Christina Bryan is a 56 y.o. female. ? ?Patient is a 57 year old female with history of type 1 diabetes, peripheral neuropathy, and recurrent diabetic foot ulcers.  Patient presenting today with complaints of drainage and swelling in her foot.  Patient reports waking up several nights ago in the night.  She walked across the floor in the dark and stepped on an electric cord.  The next day, she developed increased swelling and drainage to the bottom of the foot.  She denies to me that she is having pain, but tells me that she has no feeling in her feet secondary to her neuropathy.  She has been treated by specialist at [facility] for these issues and is due to see them next week.  She denies to me she is having any fevers or chills. ? ?The history is provided by the patient.  ?Wound Check ?This is a new problem. The current episode started yesterday. The problem occurs constantly. The problem has been rapidly worsening. Nothing aggravates the symptoms. Nothing relieves the symptoms.  ? ?  ? ?Home Medications ?Prior to Admission medications   ?Medication Sig Start Date End Date Taking? Authorizing Provider  ?acyclovir (ZOVIRAX) 400 MG tablet acyclovir 400 mg tablet ? TK 1 T PO  BID 03/15/20   [provider]  ?atorvastatin (LIPITOR) 40 MG tablet Take 1 tablet (40 mg total) by mouth daily. 01/02/22   Iran Ouch, MD  ?B Complex Vitamins (B COMPLEX 1 PO) B Complex    [provider]  ?calcium carbonate (OSCAL) 1500 (600 Ca) MG TABS tablet Take 1,200 mg of elemental calcium by mouth every evening.    [provider]  ?insulin lispro (HUMALOG) 100 UNIT/ML injection Humalog U-100 Insulin 100 unit/mL subcutaneous solution ? INJECT UP TO 50 UNITS UNDER THE SKIN DAILY WITH INSULIN PUMP    [provider]  ?levothyroxine (SYNTHROID) 100 MCG tablet Take 100 mcg by mouth every evening.    [provider]  ?lidocaine (LIDODERM) 5 % lidocaine 5 % topical patch    [provider]  ?midodrine (PROAMATINE) 5 MG tablet Take 1 tablet (5 mg total) by mouth daily as needed. For low BP 12/20/21   Antonieta Iba, MD  ?Omega-3 Fatty Acids (FISH OIL) 1000 MG CAPS Fish Oil    [provider]  ?omeprazole (PRILOSEC) 40 MG capsule Take 40 mg by mouth every evening.    [provider]  ?   ? ?Allergies    ?Aminoglycosides, Cranberry, Morphine and related, and Triprolidine-pseudoephedrine   ? ?Review of Systems   ?Review of Systems  ?All other systems reviewed and are negative. ? ?Physical Exam ?Updated Vital Signs ?BP (!) 166/90   Pulse 87   Temp 98.3 ?F (36.8 ?C) (Oral)   Resp 18   SpO2 99%  ?Physical Exam ?Vitals and nursing note reviewed.  ?Constitutional:   ?   General: She is not in acute distress. ?   Appearance: Normal appearance. She is not ill-appearing.  ?Pulmonary:  ?   Effort: Pulmonary effort is normal.  ?Musculoskeletal:  ?   Comments: To the bottom of the right foot, there is an area of blistering just proximal to the MTP joint.  There is purulent drainage coming from this.  ?Skin: ?  General: Skin is warm and dry.  ?Neurological:  ?   Mental Status: She is alert.  ? ? ?ED Results / Procedures / Treatments   ?Labs ?(all labs ordered are listed, but only abnormal results are displayed) ?Labs Reviewed  ?CBC - Abnormal; Notable for the following components:  ?    Result Value  ? RBC 3.04 (*)   ? Hemoglobin 9.0 (*)   ? HCT 27.7 (*)   ? All other components within normal limits  ?BASIC METABOLIC PANEL - Abnormal; Notable for the following components:  ? Glucose, Bld 116 (*)   ? Creatinine, Ser 1.21 (*)   ? GFR, Estimated 53 (*)   ? All other components within normal limits  ?AEROBIC/ANAEROBIC CULTURE W GRAM STAIN (SURGICAL/DEEP WOUND)  ? ? ?EKG ?None ? ?Radiology ?DG  Foot Complete Right ? ?Result Date: 05/11/2022 ?CLINICAL DATA:  Pain EXAM: RIGHT FOOT COMPLETE - 3+ VIEW COMPARISON:  12/14/2021 FINDINGS: Prior section of the right 5th toe including metatarsal. Prior resection of the 1st through 4th distal metatarsals. Screw fixation across the PIP joints of the 2nd through 4th toes. No evidence of acute osteomyelitis. Soft tissues are intact. IMPRESSION: No acute bony abnormality. Electronically Signed   By: Charlett Nose M.D.   On: 05/11/2022 03:11   ? ?Procedures ?Procedures  ? ? ?Medications Ordered in ED ?Medications - No data to display ? ?ED Course/ Medical Decision Making/ A&P ? ?Patient with history of recurrent infections related to diabetes.  She presents today with complaints of drainage and swelling to the bottom of her foot after stepping on an electric cord 2 nights ago.  She has purulent drainage coming from the soft tissues at the distal plantar surface along with surrounding erythema.  Wound culture was obtained and patient was given IV Cipro and oral doxycycline.  Cipro was chosen as her cultures in the past grew Pseudomonas susceptible to Cipro.  I also prescribed doxycycline for coverage of MRSA. ? ?Patient has an upcoming appointment with her wound care specialist at [facility] and I have advised her to keep this appointment.  She will be prescribed Cipro and Doxy. ? ?Final Clinical Impression(s) / ED Diagnoses ?Final diagnoses:  ?None  ? ? ?Rx / DC Orders ?ED Discharge Orders   ? ? None  ? ?  ? ? ?  ?Geoffery Lyons, MD ?05/11/22 [phone] ? ?

## 2022-05-11 NOTE — ED Triage Notes (Signed)
Pt states that she has a diabetic foot wound that is healing on her R foot, now has a new on that popped up over the past 3 days and 3 ulcers on her medial leg. Denies fevers. Reports that her wounds are followed by Romeo Apple  ?

## 2022-05-11 NOTE — Discharge Instructions (Signed)
Begin taking Cipro and doxycycline as prescribed. ? ?Local wound care with dressing changes twice daily. ? ?Follow-up with your wound care doctor at [facility] as scheduled, and return to the ER if symptoms worsen or change. ?

## 2022-05-16 LAB — AEROBIC/ANAEROBIC CULTURE W GRAM STAIN (SURGICAL/DEEP WOUND)
Gram Stain: NONE SEEN
Special Requests: NORMAL

## 2022-05-17 ENCOUNTER — Telehealth: Payer: Self-pay | Admitting: *Deleted

## 2022-05-17 NOTE — Telephone Encounter (Signed)
Post ED Visit - Positive Culture Follow-up  Culture report reviewed by antimicrobial stewardship pharmacist: [facility] []  , Pharm.D. []  Enzo Bi, Pharm.D., BCPS AQ-ID []  , Pharm.D., BCPS []  Celedonio Miyamoto, Pharm.D., BCPS []  [place], Garvin Fila.D., BCPS, AAHIVP []  , Pharm.D., BCPS, AAHIVP []  Georgina Pillion, PharmD, BCPS []  , PharmD, BCPS []  [place], PharmD, BCPS []  [place], PharmD []  , PharmD, BCPS []  Estella Husk, PharmD  Pharmacy Team []  Lysle Pearl, PharmD []  , PharmD []  Phillips Climes, PharmD []  , Rph []  Agapito Games) , PharmD []  Verlan Friends, PharmD []  , PharmD []  Mervyn Gay, PharmD []  , PharmD []  Vinnie Level, PharmD []  [facility], PharmD []  , PharmD []  Len Childs, PharmD   Positive urine culture Treated with Ciprofloxacin HCL, organism sensitive to the same and no further patient follow-up is required at this time.  , PharmD  Greer Pickerel Talley 05/17/2022, 9:10 AM

## 2022-06-12 ENCOUNTER — Encounter: Payer: Self-pay | Admitting: Internal Medicine

## 2022-06-12 ENCOUNTER — Ambulatory Visit (INDEPENDENT_AMBULATORY_CARE_PROVIDER_SITE_OTHER): Payer: Medicare Other | Admitting: Internal Medicine

## 2022-06-12 ENCOUNTER — Other Ambulatory Visit: Payer: Self-pay

## 2022-06-12 VITALS — BP 108/63 | HR 78 | Temp 97.6°F | Ht 67.0 in | Wt 161.8 lb

## 2022-06-12 DIAGNOSIS — Z9641 Presence of insulin pump (external) (internal): Secondary | ICD-10-CM | POA: Diagnosis not present

## 2022-06-12 DIAGNOSIS — E1043 Type 1 diabetes mellitus with diabetic autonomic (poly)neuropathy: Secondary | ICD-10-CM

## 2022-06-12 DIAGNOSIS — M5440 Lumbago with sciatica, unspecified side: Secondary | ICD-10-CM

## 2022-06-12 DIAGNOSIS — Z8639 Personal history of other endocrine, nutritional and metabolic disease: Secondary | ICD-10-CM | POA: Diagnosis not present

## 2022-06-12 DIAGNOSIS — E1010 Type 1 diabetes mellitus with ketoacidosis without coma: Secondary | ICD-10-CM | POA: Diagnosis not present

## 2022-06-12 DIAGNOSIS — E1143 Type 2 diabetes mellitus with diabetic autonomic (poly)neuropathy: Secondary | ICD-10-CM | POA: Insufficient documentation

## 2022-06-12 DIAGNOSIS — K3184 Gastroparesis: Secondary | ICD-10-CM | POA: Diagnosis not present

## 2022-06-12 DIAGNOSIS — Z1239 Encounter for other screening for malignant neoplasm of breast: Secondary | ICD-10-CM

## 2022-06-12 DIAGNOSIS — M25511 Pain in right shoulder: Secondary | ICD-10-CM | POA: Diagnosis not present

## 2022-06-12 LAB — POCT GLYCOSYLATED HEMOGLOBIN (HGB A1C): Hemoglobin A1C: 8.3 % — AB (ref 4.0–5.6)

## 2022-06-12 LAB — GLUCOSE, CAPILLARY: Glucose-Capillary: 161 mg/dL — ABNORMAL HIGH (ref 70–99)

## 2022-06-12 NOTE — Assessment & Plan Note (Signed)
Pt endorses h/o chronic low back pain with sciatica. States she was seen by pain clinic in [place] but now lives in [place] to take care of her mother.  -Referral to pain clinic today

## 2022-06-12 NOTE — Progress Notes (Signed)
   CC: pain  HPI:  [name] is a 56 y.o. with past medical history as noted below who presents to the clinic today for pain. Please see problem-based list for further details, assessments, and plans.   Past Medical History:  Diagnosis Date   Diabetes mellitus without complication (HCC)    Thyroid disease    Review of Systems: Negative aside from that listed in individualized problem based charting.   Physical Exam:  Vitals:   06/12/22 1336  BP: 108/63  Pulse: 78  Temp: 97.6 F (36.4 C)  TempSrc: Oral  SpO2: 99%  Weight: 161 lb 12.8 oz (73.4 kg)  Height: 5\' 7"  (1.702 m)   General: NAD, nl appearance HE: Normocephalic, atraumatic, EOMI, Conjunctivae normal ENT: No congestion, no rhinorrhea, no exudate or erythema  Cardiovascular: Normal rate, regular rhythm. No murmurs, rubs, or gallops Pulmonary: Effort normal, breath sounds normal. No wheezes, rales, or rhonchi Abdominal: soft, nontender, bowel sounds present Musculoskeletal: no swelling, deformity, injury. Right shoulder: Shoulder abduction is nearly full but limited due to pain. Pain with empty can test on the right side. Pain with IR but no weakness was appreciated Skin: Warm, dry, no bruising, erythema, or rash Psychiatric/Behavioral: normal mood, normal behavior      Assessment & Plan:   See Encounters Tab for problem based charting.  Patient discussed with Dr. 

## 2022-06-12 NOTE — Patient Instructions (Addendum)
Thank you, Ms.Mirta Ost for allowing [place] to provide your care today. Today we discussed your diabetes, shoulder pain, and dry eyes.    Shoulder pain: Discussed gradual resumption of activities as tolerated. I have referred you to the pain clinic as well with preference for Dr. Vear Clock from [facility].  Diabetes: Your A1c was 8.3 today. I am glad you are motivated to improve your diabetes!  Gastroparesis: I have referred you to GI  Dry eyes: You can try over the country Xidra or Restasis moving forward. Let us know if this does not help.   I have ordered the following labs for you:  Lab Orders         Glucose, capillary         POC Hbg A1C       Referrals ordered today:   Referral Orders         Ambulatory referral to Pain Clinic         Ambulatory referral to Gastroenterology      I have ordered the following medication/changed the following medications:   Stop the following medications: There are no discontinued medications.   Start the following medications: No orders of the defined types were placed in this encounter.    Follow up: 2-3 months    Should you have any questions or concerns please call the internal medicine clinic at [phone].

## 2022-06-12 NOTE — Assessment & Plan Note (Signed)
Pt has a history of T1DM that has not been adequately controlled in the past. Has an insulin pump and is on a sliding scale. She is currently followed by an endocrinologist. A1c today of 8.3, down from 8.5 during last visit.  Congratulated patient on motivation to control her sugars. Continue follow-up with endocrinology. Can discuss further at next visit.

## 2022-06-12 NOTE — Assessment & Plan Note (Signed)
States that she [place] GI in the [place] but has now moved here to take care of her mother with dementia.  -Referral to GI was placed today

## 2022-06-12 NOTE — Assessment & Plan Note (Signed)
The patient endorses a history of about 1-2 months of right shoulder pain.  She describes it as sharp.  She states that she was placed on Cipro sustained about a month ago for a wound infection.  She has had the shoulder pain ever since.  She does not endorse any weakness but rather complains of pain today.  She has not tried pain medication she is perform Pilates exercise in the past which she says have helped relieve her pain.  The pain bothers her when she is sleeping.  Exam: Shoulder abduction is nearly full but somewhat limited due to pain. Pain with empty can test on the right. Pain with internal rotation but no weakness appreciated.  Plan: No signs of rotator cuff tear on exam. Management will be supportive.

## 2022-06-13 NOTE — Progress Notes (Signed)
Internal Medicine Clinic Attending ? ?I saw and evaluated the patient.  I personally confirmed the key portions of the history and exam documented by Dr. Bonanno and I reviewed pertinent patient test results.  The assessment, diagnosis, and plan were formulated together and I agree with the documentation in the resident?s note. ? ?

## 2022-09-09 ENCOUNTER — Ambulatory Visit (INDEPENDENT_AMBULATORY_CARE_PROVIDER_SITE_OTHER): Payer: Medicare Other

## 2022-09-09 ENCOUNTER — Other Ambulatory Visit: Payer: Self-pay | Admitting: Internal Medicine

## 2022-09-09 DIAGNOSIS — Z1231 Encounter for screening mammogram for malignant neoplasm of breast: Secondary | ICD-10-CM

## 2022-09-09 DIAGNOSIS — Z0000 Encounter for general adult medical examination without abnormal findings: Secondary | ICD-10-CM

## 2022-09-09 DIAGNOSIS — E1010 Type 1 diabetes mellitus with ketoacidosis without coma: Secondary | ICD-10-CM

## 2022-09-09 DIAGNOSIS — Z1211 Encounter for screening for malignant neoplasm of colon: Secondary | ICD-10-CM

## 2022-09-09 NOTE — Progress Notes (Signed)
Subjective:   Christina Bryan is a 56 y.o. female who presents for an Initial Medicare Annual Wellness Visit. I connected with  Christina Bryan on 09/09/22 by a audio enabled telemedicine application and verified that I am speaking with the correct person using two identifiers.  Patient Location: Other:  Office/Clinic  Provider Location: Office/Clinic  I discussed the limitations of evaluation and management by telemedicine. The patient expressed understanding and agreed to proceed.  Review of Systems    Defer to PCP       Objective:    There were no vitals filed for this visit. There is no height or weight on file to calculate BMI.     09/09/2022    1:53 PM 06/12/2022    1:34 PM 05/11/2022   12:50 AM 10/24/2021    9:06 AM 10/22/2021    3:54 PM 09/18/2021    2:34 PM  Advanced Directives  Does Patient Have a Medical Advance Directive? No No No No Yes No  Type of Agricultural consultant;Living will   Does patient want to make changes to medical advance directive?    No - Patient declined    Copy of Healthcare Power of Attorney in Chart?     No - copy requested   Would patient like information on creating a medical advance directive? No - Patient declined No - Patient declined  No - Patient declined No - Patient declined Yes (MAU/Ambulatory/Procedural Areas - Information given)    Current Medications (verified) Outpatient Encounter Medications as of 09/09/2022  Medication Sig   atorvastatin (LIPITOR) 40 MG tablet Take 1 tablet (40 mg total) by mouth daily.   B Complex Vitamins (B COMPLEX 1 PO) B Complex   calcium carbonate (OSCAL) 1500 (600 Ca) MG TABS tablet Take 1,200 mg of elemental calcium by mouth every evening.   ciprofloxacin (CIPRO) 500 MG tablet Take 1 tablet (500 mg total) by mouth 2 (two) times daily. One po bid x 7 days   doxycycline (VIBRAMYCIN) 100 MG capsule Take 1 capsule (100 mg total) by mouth 2 (two) times daily. One po bid x 7 days    insulin lispro (HUMALOG) 100 UNIT/ML injection Humalog U-100 Insulin 100 unit/mL subcutaneous solution  INJECT UP TO 50 UNITS UNDER THE SKIN DAILY WITH INSULIN PUMP   levothyroxine (SYNTHROID) 100 MCG tablet Take 100 mcg by mouth every evening.   lidocaine (LIDODERM) 5 % lidocaine 5 % topical patch   midodrine (PROAMATINE) 5 MG tablet Take 1 tablet (5 mg total) by mouth daily as needed. For low BP   Omega-3 Fatty Acids (FISH OIL) 1000 MG CAPS Fish Oil   omeprazole (PRILOSEC) 40 MG capsule Take 40 mg by mouth every evening.   acyclovir (ZOVIRAX) 400 MG tablet acyclovir 400 mg tablet  TK 1 T PO  BID   No facility-administered encounter medications on file as of 09/09/2022.    Allergies (verified) Aminoglycosides, Cranberry, Morphine and related, and Triprolidine-pseudoephedrine   History: Past Medical History:  Diagnosis Date   Diabetes mellitus without complication (HCC)    Thyroid disease    Past Surgical History:  Procedure Laterality Date   BACK SURGERY     CAROTID ENDARTERECTOMY     FOOT AMPUTATION     FOOT SURGERY Right    LEFT HEART CATH AND CORONARY ANGIOGRAPHY N/A 10/24/2021   Procedure: LEFT HEART CATH AND CORONARY ANGIOGRAPHY;  Surgeon: Iran Ouch, MD;  Location: [facility];  Service: Cardiovascular;  Laterality: N/A;   Family History  Problem Relation Age of Onset   Dementia Mother    CAD Father    Social History   Socioeconomic History   Marital status: Married    Spouse name: Not on file   Number of children: Not on file   Years of education: Not on file   Highest education level: Not on file  Occupational History   Not on file  Tobacco Use   Smoking status: Former    Packs/day: 1.00    Years: 25.00    Total pack years: 25.00    Types: Cigarettes    Quit date: 09/09/2019    Years since quitting: 3.0   Smokeless tobacco: Never  Vaping Use   Vaping Use: Never used  Substance and Sexual Activity   Alcohol use: Never   Drug use: Yes     Types: Marijuana   Sexual activity: Not Currently  Other Topics Concern   Not on file  Social History Narrative   Not on file   Social Determinants of Health   Financial Resource Strain: Low Risk  (09/09/2022)   Overall Financial Resource Strain (CARDIA)    Difficulty of Paying Living Expenses: Not hard at all  Food Insecurity: No Food Insecurity (09/09/2022)   Hunger Vital Sign    Worried About Running Out of Food in the Last Year: Never true    Ran Out of Food in the Last Year: Never true  Transportation Needs: No Transportation Needs (09/09/2022)   PRAPARE - Administrator, Civil Service (Medical): No    Lack of Transportation (Non-Medical): No  Physical Activity: Insufficiently Active (09/09/2022)   Exercise Vital Sign    Days of Exercise per Week: 2 days    Minutes of Exercise per Session: 40 min  Stress: No Stress Concern Present (09/09/2022)   [employer] of Occupational Health - Occupational Stress Questionnaire    Feeling of Stress : Not at all  Social Connections: Moderately Isolated (09/09/2022)   Social Connection and Isolation Panel [NHANES]    Frequency of Communication with Friends and Family: More than three times a week    Frequency of Social Gatherings with Friends and Family: Never    Attends Religious Services: Never    Diplomatic Services operational officer: No    Attends Engineer, structural: Never    Marital Status: Married    Tobacco Counseling Counseling given: Not Answered   Clinical Intake:  Pre-visit preparation completed: Yes  Pain : No/denies pain     Nutritional Risks: None Diabetes: Yes  How often do you need to have someone help you when you read instructions, pamphlets, or other written materials from your doctor or pharmacy?: 1 - Never What is the last grade level you completed in school?: 2nd year graduate studies  Diabetic?No  Interpreter Needed?: No  Information entered by :: Christina Bryan,cma  09/09/22 1:50pm   Activities of Daily Living    09/09/2022    1:51 PM 06/12/2022    1:35 PM  In your present state of health, do you have any difficulty performing the following activities:  Hearing? 0 0  Vision? 1 1  Difficulty concentrating or making decisions? 0 1  Comment  AT TIMES  Walking or climbing stairs? 0 1  Dressing or bathing? 0 0  Doing errands, shopping? 0 1  Comment  AT TIMES    Patient Care Team: Adron Bene, MD as PCP - General (  Internal Medicine)  Indicate any recent Medical Services you may have received from other than [facility] providers in the past year (date may be approximate).     Assessment:   This is a routine wellness examination for [facility]  Hearing/Vision screen No results found.  Dietary issues and exercise activities discussed:     Goals Addressed   None    Depression Screen    09/09/2022    1:51 PM 06/12/2022    1:35 PM 10/22/2021    3:56 PM 09/18/2021    2:37 PM  PHQ 2/9 Scores  PHQ - 2 Score 0 0 0 0  PHQ- 9 Score   0 2    Fall Risk    09/09/2022    1:50 PM 06/12/2022    1:34 PM 10/22/2021    3:52 PM 09/18/2021    2:34 PM  Fall Risk   Falls in the past year? 1 1 1 1   Number falls in past yr: 0 1 1 1   Injury with Fall? 0 0 1 1  Comment    SOMEWHAT-SCRATCHES  Risk for fall due to : Impaired balance/gait  History of fall(s);Impaired balance/gait   Follow up Falls evaluation completed;Falls prevention discussed Falls evaluation completed;Education provided Falls evaluation completed;Falls prevention discussed Falls evaluation completed;Education provided    FALL RISK PREVENTION PERTAINING TO THE HOME:  Any stairs in or around the home? No  If so, are there any without handrails? No  Home free of loose throw rugs in walkways, pet beds, electrical cords, etc? Yes  Adequate lighting in your home to reduce risk of falls? Yes   ASSISTIVE DEVICES UTILIZED TO PREVENT FALLS:  Life alert? No  Use of a cane, walker or w/c? No   Grab bars in the bathroom? Yes  Shower chair or bench in shower? Yes  Elevated toilet seat or a handicapped toilet? No   TIMED UP AND GO:  Was the test performed? No .  Length of time to ambulate 10 feet: N/A sec.   Gait unsteady without use of assistive device, provider informed and interventions were implemented  Cognitive Function:        09/09/2022    1:52 PM  6CIT Screen  What Year? 0 points  What month? 0 points  What time? 0 points  Count back from 20 0 points  Months in reverse 0 points  Repeat phrase 0 points  Total Score 0 points    Immunizations Immunization History  Administered Date(s) Administered   Influenza,inj,Quad PF,6+ Mos 10/06/2020   Influenza,inj,quad, With Preservative 12/02/2017   Influenza-Unspecified 09/18/2021   PFIZER(Purple Top)SARS-COV-2 Vaccination 03/22/2020, 04/13/2020   Tdap 02/08/2021   Zoster Recombinat (Shingrix) 04/06/2021    TDAP status: Up to date  Flu Vaccine status: Due, Education has been provided regarding the importance of this vaccine. Advised may receive this vaccine at local pharmacy or Health Dept. Aware to provide a copy of the vaccination record if obtained from local pharmacy or Health Dept. Verbalized acceptance and understanding.  Pneumococcal vaccine status: Due, Education has been provided regarding the importance of this vaccine. Advised may receive this vaccine at local pharmacy or Health Dept. Aware to provide a copy of the vaccination record if obtained from local pharmacy or Health Dept. Verbalized acceptance and understanding.  Covid-19 vaccine status: Completed vaccines  Qualifies for Shingles Vaccine? No   Zostavax completed No   Shingrix Completed?: Yes  Screening Tests Health Maintenance  Topic Date Due   FOOT EXAM  Never done  OPHTHALMOLOGY EXAM  Never done   HIV Screening  Never done   Hepatitis C Screening  Never done   PAP SMEAR-Modifier  Never done   COLONOSCOPY (Pts 45-9521yrs Insurance  coverage will need to be confirmed)  Never done   MAMMOGRAM  Never done   COVID-19 Vaccine (3 - Pfizer series) 06/08/2020   INFLUENZA VACCINE  07/30/2022   URINE MICROALBUMIN  09/18/2022   HEMOGLOBIN A1C  12/12/2022   TETANUS/TDAP  02/08/2031   Zoster Vaccines- Shingrix  Completed   HPV VACCINES  Aged Out    Health Maintenance  Health Maintenance Due  Topic Date Due   FOOT EXAM  Never done   OPHTHALMOLOGY EXAM  Never done   HIV Screening  Never done   Hepatitis C Screening  Never done   PAP SMEAR-Modifier  Never done   COLONOSCOPY (Pts 45-4521yrs Insurance coverage will need to be confirmed)  Never done   MAMMOGRAM  Never done   COVID-19 Vaccine (3 - Pfizer series) 06/08/2020   INFLUENZA VACCINE  07/30/2022   URINE MICROALBUMIN  09/18/2022    Colorectal cancer screening: Referral to GI placed 09/09/22. Pt aware the office will call re: appt.  Mammogram status: Ordered 09/09/22. Pt provided with contact info and advised to call to schedule appt.     Lung Cancer Screening: (Low Dose CT Chest recommended if Age 5-80 years, 30 pack-year currently smoking OR have quit w/in 15years.) does not qualify.   Lung Cancer Screening Referral:N/A  Additional Screening:   Vision Screening: Recommended annual ophthalmology exams for early detection of glaucoma and other disorders of the eye. Is the patient up to date with their annual eye exam?  No  Who is the provider or what is the name of the office in which the patient attends annual eye exams? N/A If pt is not established with a provider, would they like to be referred to a provider to establish care? Yes .   Dental Screening: Recommended annual dental exams for proper oral hygiene  Community Resource Referral / Chronic Care Management: CRR required this visit?  No   CCM required this visit?  No      Plan:     I have personally reviewed and noted the following in the patient's chart:   Medical and social history Use of  alcohol, tobacco or illicit drugs  Current medications and supplements including opioid prescriptions. Patient is not currently taking opioid prescriptions. Functional ability and status Nutritional status Physical activity Advanced directives List of other physicians Hospitalizations, surgeries, and ER visits in previous 12 months Vitals Screenings to include cognitive, depression, and falls Referrals and appointments  In addition, I have reviewed and discussed with patient certain preventive protocols, quality metrics, and best practice recommendations. A written personalized care plan for preventive services as well as general preventive health recommendations were provided to patient.     Christina Bryan, [facility] LPCMA   09/09/2022   Nurse Notes: Non Face-To-Face 10 minute visit  Christina Bryan , Thank you for taking time to come for your Medicare Wellness Visit. I appreciate your ongoing commitment to your health goals. Please review the following plan we discussed and let me know if I can assist you in the future.   These are the goals we discussed:  Goals   None     This is a list of the screening recommended for you and due dates:  Health Maintenance  Topic Date Due   Complete foot exam   Never  done   Eye exam for diabetics  Never done   HIV Screening  Never done   Hepatitis C Screening: USPSTF Recommendation to screen - Ages 53-79 yo.  Never done   Pap Smear  Never done   Colon Cancer Screening  Never done   Mammogram  Never done   COVID-19 Vaccine (3 - Pfizer series) 06/08/2020   Flu Shot  07/30/2022   Urine Protein Check  09/18/2022   Hemoglobin A1C  12/12/2022   Tetanus Vaccine  02/08/2031   Zoster (Shingles) Vaccine  Completed   HPV Vaccine  Aged Out

## 2022-09-26 NOTE — Progress Notes (Signed)
Internal Medicine Clinic Attending  Case and documentation reviewed.  I reviewed the AWV findings.  I agree with the assessment, diagnosis, and plan of care documented in the AWV note.     

## 2022-10-02 ENCOUNTER — Telehealth: Payer: Self-pay | Admitting: Cardiovascular Disease

## 2022-10-02 NOTE — Telephone Encounter (Signed)
Patient stated she started PT last week and her intermittent pain/tightness may be exercise-related. She has "spasms" that come and go randomly that last for seconds. She also described a sharp pain at her left chest area. She denies nausea and sob. BP range 128/71, P 81 to 131/66, P 76. Recommended that if she has chest pain/tightness again, to be taken to the ED. She stated if her pain lasts longer, she will go to the [facility] ED. Appointment made with E. Monge for 10/6.

## 2022-10-02 NOTE — Telephone Encounter (Signed)
Pt c/o of Chest Pain: STAT if CP now or developed within 24 hours  1. Are you having CP right now?  Not currently, but did have a brief occurrence while on the phone (CP only lasts a few seconds then goes away)  2. Are you experiencing any other symptoms (ex. SOB, nausea, vomiting, sweating)?  CP feels like a squeezing/spasm, not a sharp pain. No other symptoms--denies SOB and states BP is fine  3. How long have you been experiencing CP?  Past 2 days   4. Is your CP continuous or coming and going?  Coming and going   5. Have you taken Nitroglycerin?  No, patient states she has not taken anything  ?

## 2022-10-04 ENCOUNTER — Ambulatory Visit: Payer: Medicare Other | Admitting: Nurse Practitioner

## 2022-10-08 ENCOUNTER — Ambulatory Visit: Payer: Medicare Other | Admitting: Nurse Practitioner

## 2022-10-08 NOTE — Progress Notes (Deleted)
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  Past Medical History    Past Medical History:  Diagnosis Date   Diabetes mellitus without complication (HCC)    Thyroid disease    Past Surgical History:  Procedure Laterality Date   BACK SURGERY     CAROTID ENDARTERECTOMY     FOOT AMPUTATION     FOOT SURGERY Right    LEFT HEART CATH AND CORONARY ANGIOGRAPHY N/A 10/24/2021   Procedure: LEFT HEART CATH AND CORONARY ANGIOGRAPHY;  Surgeon: Iran Ouch, MD;  Location: [facility];  Service: Cardiovascular;  Laterality: N/A;    Allergies  Allergies  Allergen Reactions   Aminoglycosides Nausea And Vomiting    Mycin Drugs   Cranberry Hives   Morphine And Related Itching   Triprolidine-Pseudoephedrine Hives    History of Present Illness    57 year old female with the above past medical history including CAD, 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  Coronary CTA in September 2022 showed calcium score of 2343 with heavily calcified plaque in the RCA and LAD.  Subsequent cardiac catheterization in October 2022 revealed heavily calcified coronary arteries with moderate two-vessel CAD involving the LAD and RCA, no obstructive disease, EF and normal LVEDP.  She has a history of carotid artery disease's s/p right carotid endarterectomy.  Carotid Dopplers in 12/2021 showed no evidence of carotid artery stenosis.  She was referred to Dr. Kirke Corin for  evaluation of possible peripheral arterial disease in the setting of diabetic foot ulcers.  ABIs in 10/2021 were unremarkable.  She was last seen in the office on 12/11/2021 and was stable from a cardiac standpoint.  She denied symptoms concerning for angina.  Aggressive risk factor management was recommended for moderate nonobstructive CAD.  Her atorvastatin was increased to 40 mg daily.  She contacted our office on 10/02/2022 and reported intermittent exertional chest pain.  She was advised to go to the ED should her symptoms worsen.  She presents today for follow-up.  Since her last visit  CAD/chest pain: Orthostatic hypotension: Hyperlipidemia: Carotid artery disease: Type 1 diabetes: Tobacco use: Disposition:  Home Medications    Current Outpatient Medications  Medication Sig Dispense Refill   acyclovir (ZOVIRAX) 400 MG tablet acyclovir 400 mg tablet  TK 1 T PO  BID     atorvastatin (LIPITOR) 40 MG tablet Take 1 tablet (40 mg total) by mouth daily. 90 tablet 3   B Complex Vitamins (B COMPLEX 1 PO) B Complex     calcium carbonate (OSCAL) 1500 (600 Ca) MG TABS tablet Take 1,200 mg of elemental calcium by mouth every evening.     ciprofloxacin (CIPRO) 500 MG tablet Take 1 tablet (500 mg total) by mouth 2 (two) times daily. One po bid x 7 days 14 tablet 0   doxycycline (VIBRAMYCIN) 100 MG capsule Take 1 capsule (100 mg total) by mouth 2 (two) times daily. One po bid x 7 days 14 capsule 0   insulin lispro (HUMALOG) 100 UNIT/ML injection Humalog U-100 Insulin 100  unit/mL subcutaneous solution  INJECT UP TO 50 UNITS UNDER THE SKIN DAILY WITH INSULIN PUMP     levothyroxine (SYNTHROID) 100 MCG tablet Take 100 mcg by mouth every evening.     lidocaine (LIDODERM) 5 % lidocaine 5 % topical patch     midodrine (PROAMATINE) 5 MG tablet Take 1 tablet (5 mg total) by mouth daily as needed. For low BP 90 tablet 0   Omega-3 Fatty Acids (FISH OIL) 1000 MG CAPS Fish Oil     omeprazole (PRILOSEC) 40 MG  capsule Take 40 mg by mouth every evening.     No current facility-administered medications for this visit.     Review of Systems    ***.  All other systems reviewed and are otherwise negative except as noted above.    Physical Exam    VS:  There were no vitals taken for this visit. , BMI There is no height or weight on file to calculate BMI.     GEN: Well nourished, well developed, in no acute distress. HEENT: normal. Neck: Supple, no JVD, carotid bruits, or masses. Cardiac: RRR, no murmurs, rubs, or gallops. No clubbing, cyanosis, edema.  Radials/DP/PT 2+ and equal bilaterally.  Respiratory:  Respirations regular and unlabored, clear to auscultation bilaterally. GI: Soft, nontender, nondistended, BS + x 4. MS: no deformity or atrophy. Skin: warm and dry, no rash. Neuro:  Strength and sensation are intact. Psych: Normal affect.  Accessory Clinical Findings    ECG personally reviewed by me today - *** - no acute changes.   Lab Results  Component Value Date   WBC 10.1 05/11/2022   HGB 9.0 (L) 05/11/2022   HCT 27.7 (L) 05/11/2022   MCV 91.1 05/11/2022   PLT 233 05/11/2022   Lab Results  Component Value Date   CREATININE 1.21 (H) 05/11/2022   BUN 19 05/11/2022   NA 135 05/11/2022   K 4.2 05/11/2022   CL 101 05/11/2022   CO2 27 05/11/2022   Lab Results  Component Value Date   ALT 14 08/29/2021   AST 16 08/29/2021   ALKPHOS 92 08/29/2021   BILITOT 1.2 08/29/2021   Lab Results  Component Value Date   CHOL 146 09/18/2021   HDL 72 09/18/2021   LDLCALC 61 09/18/2021   TRIG 64 09/18/2021   CHOLHDL 2.0 09/18/2021    Lab Results  Component Value Date   HGBA1C 8.3 (A) 06/12/2022    Assessment & Plan    1.  ***  No BP recorded.  {Refresh Note OR Click here to enter BP  :1}***   Lenna Sciara, NP 10/08/2022, 6:19 AM

## 2022-10-09 ENCOUNTER — Ambulatory Visit (INDEPENDENT_AMBULATORY_CARE_PROVIDER_SITE_OTHER): Payer: Medicare Other | Admitting: Internal Medicine

## 2022-10-09 VITALS — BP 169/82 | HR 81 | Temp 97.8°F | Ht 67.0 in | Wt 160.2 lb

## 2022-10-09 DIAGNOSIS — Z23 Encounter for immunization: Secondary | ICD-10-CM | POA: Diagnosis not present

## 2022-10-09 DIAGNOSIS — H9191 Unspecified hearing loss, right ear: Secondary | ICD-10-CM

## 2022-10-09 DIAGNOSIS — I209 Angina pectoris, unspecified: Secondary | ICD-10-CM | POA: Diagnosis not present

## 2022-10-09 MED ORDER — DEBROX 6.5 % OT SOLN: 5.0000 [drp] | Freq: Two times a day (BID) | OTIC | 0 refills | Status: AC

## 2022-10-09 NOTE — Patient Instructions (Signed)
Dear Mrs. Lage,  Thank you for trusting [place] with your care today. We discussed your hearing loss. I am prescribing debrox drops to use in your ear to help break up the wax. If you do not notice an improvement in your hearing in 1 week, please call our office to be re-evaluated.

## 2022-10-09 NOTE — Progress Notes (Signed)
   CC: ear infection  HPI:[name] is a 56 y.o. female who presents for evaluation of hearing loss. Please see individual problem based A/P for details.  Acute onset hearing loss 6-7 days ago. Has hx otitis externa, but no pain or itching this time. No drainage, but does report rust colored ear wax with foul smell. No fevers. She has been using flonase without relief. No ringing in ears. Does endorse some dizziness, but states that this occurs predominantly when going from seated to standing position. Does have hx of orthostatic hypotension as well.    Depression, PHQ-9: Based on the patients  [place] Visit from 10/22/2021 in [place]  PHQ-9 Total Score 0      score we have .  Past Medical History:  Diagnosis Date   Diabetes mellitus without complication ([place])    Thyroid disease    Review of Systems:   See hpi  Physical Exam: There were no vitals filed for this visit.   General: nad HEENT: There is moderate amount of cerumen in right ear canal , but not impaction. Partially visualized tympanic membrane without obvious perforation. Does have mild amount of inflammation external canal. Subjective decrease in hearing with Rinne test on right compared to left. Weber test reported as "same" bilaterally.  Cardiovascular: Normal rate, regular rhythm.  No murmurs, rubs, or gallops Pulmonary : Equal breath sounds, No wheezes, rales, or rhonchi Abdominal: soft, nontender,  bowel sounds present Ext: No edema in lower extremities, no tenderness to palpation of lower extremities.   Assessment & Plan:   See Encounters Tab for problem based charting.  Hearing loss most likely due to wax obstructing air conduction. Low concern for intracranial etiology. She has had otitis external in the past, but does this does not seem consistent with that.  Recommend debrox drops.    Patient discussed with Dr. Daryll Drown

## 2022-10-09 NOTE — Assessment & Plan Note (Addendum)
Acute onset hearing loss 6-7 days ago. Has hx otitis externa, but no pain or itching this time. No drainage, but does report rust colored ear wax with foul smell. No fevers. She has been using flonase without relief. No ringing in ears. Does endorse some dizziness, but states that this occurs predominantly when going from seated to standing position. Does have hx of orthostatic hypotension as well.   There is moderate amount of cerumen in right ear canal , but not impaction. Partially visualized tympanic membrane without obvious perforation. Does have mild amount of inflammation external canal. Subjective decrease in hearing with Rinne test on right compared to left. Weber test reported as "same" bilaterally.   Hearing loss most likely due to wax obstructing air conduction. Low concern for intracranial etiology. She has had otitis external in the past, but does this does not seem consistent with that.  Recommend debrox drops.

## 2022-10-12 ENCOUNTER — Encounter: Payer: Self-pay | Admitting: Internal Medicine

## 2022-10-14 NOTE — Progress Notes (Signed)
Internal Medicine Clinic Attending  Case discussed with Dr. Gawaluck  at the time of the visit.  We reviewed the resident's history and exam and pertinent patient test results.  I agree with the assessment, diagnosis, and plan of care documented in the resident's note.  

## 2022-10-14 NOTE — Progress Notes (Signed)
Internal Medicine Clinic Attending  Case discussed with Dr. Gawaluck  at the time of the visit.  We reviewed the resident's history and exam and pertinent patient test results.  I agree with the assessment, diagnosis, and plan of care documented in the resident's note.  

## 2022-10-21 ENCOUNTER — Ambulatory Visit: Payer: Medicare Other | Attending: Nurse Practitioner | Admitting: Nurse Practitioner

## 2022-10-21 ENCOUNTER — Encounter: Payer: Self-pay | Admitting: Nurse Practitioner

## 2022-10-21 VITALS — BP 110/66 | HR 81 | Ht 67.0 in | Wt 157.6 lb

## 2022-10-21 DIAGNOSIS — E108 Type 1 diabetes mellitus with unspecified complications: Secondary | ICD-10-CM | POA: Insufficient documentation

## 2022-10-21 DIAGNOSIS — I779 Disorder of arteries and arterioles, unspecified: Secondary | ICD-10-CM | POA: Diagnosis not present

## 2022-10-21 DIAGNOSIS — I25118 Atherosclerotic heart disease of native coronary artery with other forms of angina pectoris: Secondary | ICD-10-CM | POA: Insufficient documentation

## 2022-10-21 DIAGNOSIS — I951 Orthostatic hypotension: Secondary | ICD-10-CM | POA: Insufficient documentation

## 2022-10-21 DIAGNOSIS — E785 Hyperlipidemia, unspecified: Secondary | ICD-10-CM | POA: Insufficient documentation

## 2022-10-21 NOTE — Patient Instructions (Signed)
Medication Instructions:  Your physician recommends that you continue on your current medications as directed. Please refer to the Current Medication list given to you today.  *If you need a refill on your cardiac medications before your next appointment, please call your pharmacy*   Lab Work: NONE ordered at this time of appointment   If you have labs (blood work) drawn today and your tests are completely normal, you will receive your results only by: [place] (if you have MyChart) OR A paper copy in the mail If you have any lab test that is abnormal or we need to change your treatment, we will call you to review the results.   Testing/Procedures: NONE ordered at this time of appointment     Follow-Up: At [facility], you and your health needs are our priority.  As part of our continuing mission to provide you with exceptional heart care, we have created designated Provider Care Teams.  These Care Teams include your primary Cardiologist (physician) and Advanced Practice Providers (APPs -  Physician Assistants and Nurse Practitioners) who all work together to provide you with the care you need, when you need it.  We recommend signing up for the patient portal called "MyChart".  Sign up information is provided on this After Visit Summary.  MyChart is used to connect with patients for Virtual Visits (Telemedicine).  Patients are able to view lab/test results, encounter notes, upcoming appointments, etc.  Non-urgent messages can be sent to your provider as well.   To learn more about what you can do with MyChart, go to [email or web address].    Your next appointment:   3 month(s)  The format for your next appointment:   In Person  Provider:   Ida Rogue, MD    Other Instructions   Important Information About Sugar

## 2022-10-21 NOTE — Progress Notes (Signed)
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   Past Medical History    Past Medical History:  Diagnosis Date   Diabetes mellitus without complication ([place])    Thyroid disease    Past Surgical History:  Procedure Laterality Date   BACK SURGERY     CAROTID ENDARTERECTOMY     FOOT AMPUTATION     FOOT SURGERY Right    LEFT HEART CATH AND CORONARY ANGIOGRAPHY N/A 10/24/2021   Procedure: LEFT HEART CATH AND CORONARY ANGIOGRAPHY;  Surgeon: Wellington Hampshire, MD;  Location: [facility];  Service: Cardiovascular;  Laterality: N/A;    Allergies  Allergies  Allergen Reactions   Aminoglycosides Nausea And Vomiting    Mycin Drugs   Cranberry Hives   Morphine And Related Itching   Triprolidine-Pseudoephedrine Hives    History of Present Illness    56 year old female with the above past medical history including CAD, 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   Coronary CTA in September 2022 showed calcium score of 2343 with heavily calcified plaque in the RCA and LAD.  Subsequent cardiac catheterization in October 2022 revealed heavily calcified coronary arteries with moderate two-vessel CAD involving the LAD and RCA, no obstructive disease, EF and normal LVEDP.  She has a history of carotid artery disease s/p right carotid endarterectomy.  Carotid Dopplers in 12/2021 showed no evidence of carotid artery stenosis.   She was referred to Dr. Fletcher Anon for evaluation of possible peripheral arterial disease in the setting of diabetic foot ulcers.  ABIs in 10/2021 were unremarkable.  She was last seen in the office on 12/11/2021 and was stable from a cardiac standpoint.  She denied symptoms concerning for angina.  Aggressive risk factor management was recommended for moderate nonobstructive CAD.  Her atorvastatin was increased to 40 mg daily.  She has been following with [facility] vascular surgery and underwent resection of second metatarsal followed by resection of 1/3 metatarsal heads with closure in November 2022.  She later underwent excision of presumptive osteomyelitis of fourth metatarsal on the right foot in 02/2022.  She is currently undergoing PT.  She contacted our office on 10/02/2022 and reported intermittent chest pain.  She was advised to go to the ED should her symptoms worsen.   She presents today for follow-up accompanied by her mother.  Since her last visit she has been stable from a cardiac standpoint.  She states that her episode of chest discomfort occurred following a PT session during which she was using her body weight to exercise her pectoral muscles.  Her symptoms occurred 2 days following the exercise and lasted for approximately 3 days. She denies any further chest pain, denies exertional symptoms concerning for angina.  She states that she was noted to have PVCs on telemetry during her hospitalization at [facility] in March 2023.  She denies any recent palpitations, dizziness, presyncope, syncope.  Overall, she reports feeling well and outside of her isolated episode of chest discomfort, she denies any additional  concerns today.  Home Medications    Current Outpatient Medications  Medication Sig Dispense Refill   atorvastatin (LIPITOR) 40 MG tablet Take 1 tablet (40 mg total) by mouth daily. 90 tablet 3   B Complex Vitamins (B COMPLEX 1 PO) B Complex     calcium carbonate (OSCAL) 1500 (600  Ca) MG TABS tablet Take 1,200 mg of elemental calcium by mouth every evening.     carbamide peroxide (DEBROX) 6.5 % OTIC solution Place 5 drops into the right ear 2 (two) times daily. 15 mL 0   insulin lispro (HUMALOG) 100 UNIT/ML injection Humalog U-100 Insulin 100 unit/mL subcutaneous solution  INJECT UP TO 50 UNITS UNDER THE SKIN DAILY WITH INSULIN PUMP     levothyroxine (SYNTHROID) 100 MCG tablet Take 100 mcg by mouth every evening.     lidocaine (LIDODERM) 5 % lidocaine 5 % topical patch     midodrine (PROAMATINE) 5 MG tablet Take 1 tablet (5 mg total) by mouth daily as needed. For low BP 90 tablet 0   Omega-3 Fatty Acids (FISH OIL) 1000 MG CAPS Fish Oil     omeprazole (PRILOSEC) 40 MG capsule Take 40 mg by mouth every evening.     No current facility-administered medications for this visit.     Review of Systems    She denies chest pain, palpitations, dyspnea, pnd, orthopnea, n, v, dizziness, syncope, edema, weight gain, or early satiety. All other systems reviewed and are otherwise negative except as noted above.   Physical Exam    VS:  BP 110/66 (BP Location: Left Arm)   Pulse 81   Ht 5\' 7"  (1.702 m)   Wt 157 lb 9.6 oz (71.5 kg)   SpO2 99%   BMI 24.68 kg/m   GEN: Well nourished, well developed, in no acute distress. HEENT: normal. Neck: Supple, no JVD, carotid bruits, or masses. Cardiac: RRR, no murmurs, rubs, or gallops. No clubbing, cyanosis, edema.  Radials/DP/PT 2+ and equal bilaterally.  Respiratory:  Respirations regular and unlabored, clear to auscultation bilaterally. GI: Soft, nontender, nondistended, BS + x 4. MS: no deformity or atrophy. Skin: warm and dry, no rash. Neuro:  Strength and sensation are intact. Psych: Normal affect.  Accessory Clinical Findings    ECG personally reviewed by me today -NSR, 81 bpm- no acute changes.   Lab Results  Component Value Date   WBC 10.1 05/11/2022   HGB 9.0 (L) 05/11/2022   HCT 27.7 (L) 05/11/2022   MCV 91.1  05/11/2022   PLT 233 05/11/2022   Lab Results  Component Value Date   CREATININE 1.21 (H) 05/11/2022   BUN 19 05/11/2022   NA 135 05/11/2022   K 4.2 05/11/2022   CL 101 05/11/2022   CO2 27 05/11/2022   Lab Results  Component Value Date   ALT 14 08/29/2021   AST 16 08/29/2021   ALKPHOS 92 08/29/2021   BILITOT 1.2 08/29/2021   Lab Results  Component Value Date   CHOL 146 09/18/2021   HDL 72 09/18/2021   LDLCALC 61 09/18/2021   TRIG 64 09/18/2021   CHOLHDL 2.0 09/18/2021    Lab Results  Component Value Date   HGBA1C 8.3 (A) 06/12/2022    Assessment & Plan    1. CAD/chest pain: Cath in October 2022 revealed heavily calcified coronary arteries with moderate two-vessel CAD involving the LAD and RCA, no obstructive disease, EF and normal LVED.  She had a recent episode of chest discomfort that occurred 2 days following  exercise to her pectoral muscles.  Her symptoms lasted for approximately 3 days.  She denies any exertional symptoms.  Chest discomfort likely musculoskeletal in nature.  I do not think ischemic evaluation is warranted at this time.  Advised her to continue to monitor symptoms and should she have recurrent symptoms, particularly exertional symptoms, could consider stress test at that time.  Continue aspirin, Lipitor, fish oil.   2. Orthostatic hypotension: Denies any recent dizziness, presyncope, syncope. Stable.  3. Hyperlipidemia: LDL was 61 in 08/2021.  Monitored and managed per PCP.  Continue aspirin, Lipitor.   4. Carotid artery disease: S/p right carotid endarterectomy.  Carotid Dopplers in 12/2021 showed no evidence of carotid artery stenosis.  Continue aspirin, Lipitor.  5. Type 1 diabetes: A1c was 8.3 in 05/2022.  Follows with endocrinology, also following with vascular surgery at [facility].  6. Disposition: Follow-up in 3 months with Dr. Rockey Situ.      Lenna Sciara, NP 10/21/2022, 5:21 PM

## 2023-01-16 ENCOUNTER — Ambulatory Visit (INDEPENDENT_AMBULATORY_CARE_PROVIDER_SITE_OTHER): Payer: Medicare Other | Admitting: Podiatry

## 2023-01-16 VITALS — BP 132/74

## 2023-01-16 DIAGNOSIS — E1010 Type 1 diabetes mellitus with ketoacidosis without coma: Secondary | ICD-10-CM

## 2023-01-16 DIAGNOSIS — T148XXA Other injury of unspecified body region, initial encounter: Secondary | ICD-10-CM

## 2023-01-16 NOTE — Progress Notes (Signed)
Subjective:  Patient ID: Christina Bryan, female    DOB: Jul 18, 1966,  MRN: [id number]  Chief Complaint  Patient presents with   Toe Pain    Wound to left foot 4th toe pt stated that she had a blister     57 y.o. female presents with the above complaint.  Patient presents with complaint of left fifth digit blister.  Patient states that just came out of nowhere.  She states she is doing better.  She was worried that there might be infected.  She wanted to get evaluated she denies seeing anyone else prior to seeing me for this.  She already took Keflex antibiotics which made some of the redness go away.  She is a diabetic with last A1c of 8.3.   Review of Systems: Negative except as noted in the HPI. Denies N/V/F/Ch.  Past Medical History:  Diagnosis Date   Diabetes mellitus without complication (HCC)    Thyroid disease     Current Outpatient Medications:    atorvastatin (LIPITOR) 40 MG tablet, Take 1 tablet (40 mg total) by mouth daily., Disp: 90 tablet, Rfl: 3   B Complex Vitamins (B COMPLEX 1 PO), B Complex, Disp: , Rfl:    calcium carbonate (OSCAL) 1500 (600 Ca) MG TABS tablet, Take 1,200 mg of elemental calcium by mouth every evening., Disp: , Rfl:    carbamide peroxide (DEBROX) 6.5 % OTIC solution, Place 5 drops into the right ear 2 (two) times daily., Disp: 15 mL, Rfl: 0   insulin lispro (HUMALOG) 100 UNIT/ML injection, Humalog U-100 Insulin 100 unit/mL subcutaneous solution  INJECT UP TO 50 UNITS UNDER THE SKIN DAILY WITH INSULIN PUMP, Disp: , Rfl:    levothyroxine (SYNTHROID) 100 MCG tablet, Take 100 mcg by mouth every evening., Disp: , Rfl:    lidocaine (LIDODERM) 5 %, lidocaine 5 % topical patch, Disp: , Rfl:    midodrine (PROAMATINE) 5 MG tablet, Take 1 tablet (5 mg total) by mouth daily as needed. For low BP, Disp: 90 tablet, Rfl: 0   Omega-3 Fatty Acids (FISH OIL) 1000 MG CAPS, Fish Oil, Disp: , Rfl:    omeprazole (PRILOSEC) 40 MG capsule, Take 40 mg by mouth every evening.,  Disp: , Rfl:   Social History   Tobacco Use  Smoking Status Former   Packs/day: 1.00   Years: 25.00   Total pack years: 25.00   Types: Cigarettes   Quit date: 09/09/2019   Years since quitting: 3.3  Smokeless Tobacco Never    Allergies  Allergen Reactions   Aminoglycosides Nausea And Vomiting    Mycin Drugs   Cranberry Hives   Morphine And Related Itching   Triprolidine-Pseudoephedrine Hives   Objective:   Vitals:   01/16/23 1341  BP: 132/74   There is no height or weight on file to calculate BMI. Constitutional Well developed. Well nourished.  Vascular Dorsalis pedis pulses palpable bilaterally. Posterior tibial pulses palpable bilaterally. Capillary refill normal to all digits.  No cyanosis or clubbing noted. Pedal hair growth normal.  Neurologic Normal speech. Oriented to person, place, and time. Epicritic sensation to light touch grossly present bilaterally.  Dermatologic Left third digit superficial blister limited to breakdown of the skin no signs of infection noted no redness noted no purulent drainage noted.  Orthopedic: Normal joint ROM without pain or crepitus bilaterally. No visible deformities. No bony tenderness.   Radiographs: None Assessment:   1. Friction blister   2. Type 1 diabetes mellitus with ketoacidosis without coma ([place])  Plan:  Patient was evaluated and treated and all questions answered.  Left fifth digit blister -Clinically there is no signs of infection noted at this time.  I encouraged her to finish her Keflex which she has.  She will continue to keep wrapped with Betadine to resolve meant.  I discussed shoe gear modification as well.  If any foot and ankle issues are in the future she will come back and see me.  No follow-ups on file.

## 2023-01-20 NOTE — Progress Notes (Signed)
Cardiology Office Note  Date:  01/21/2023   ID:  Christina Bryan, DOB 11/10/1966, MRN [id number]  PCP:  Adron Bene, MD   Chief Complaint  Patient presents with   3 month follow up     Patient c/o right arm pain and pain under left breast. Medications reviewed by the patient verbally.     HPI:  Christina Bryan is a 57 year old woman with past medical history of Type 1 diabetes, amputations of toes on right foot after infection of nonhealing ulcer gastroparesis Chronic orthostasis, midodrine as needed Hyperlipidemia Post ablative hypothyroidism Smoker Carotid stenosis, CEA on right 12/2017 Orthostatic hypotension Cardiac CTA September 27, 2021 calcium score 2300, significant stenosis of mid LAD, severe stenosis of RCA, moderate left circumflex disease Who presents for follow-up of her PAD, hyperlipidemia,   Last seen by myself in clinic October 2022 Seen by one of our providers October 2023  Coronary CTA in September 2022 showed calcium score of 2343 with heavily calcified plaque in the RCA and LAD.    cardiac catheterization in October 2022 revealed heavily calcified coronary arteries with moderate two-vessel CAD involving the LAD and RCA, no obstructive disease, EF and normal LVEDP.    In follow-up today denies anginal symptoms Followed by the pain clinic, having right arm pain, left upper quadrant pain Muscles are tender, gets leg cramping, tenderness up in the chest on palpation Seen by pain clinic, "may be fibromyalgias" Gets GI issues, gastroparesis  Father died, [place] Mother with dementia Stress at home  Blood pressure low, thinks this may contribute to tension in her head  EKG personally reviewed by myself on todays visit Normal sinus rhythm rate 87 bpm no significant ST-T wave changes  Other past medical history reviewed carotid artery disease s/p right carotid endarterectomy.   Carotid Dopplers in 12/2021 showed no evidence of carotid artery  stenosis.    ABIs in 10/2021 were unremarkable.    [facility] vascular surgery and underwent resection of second metatarsal followed by resection of 1/3 metatarsal heads with closure in November 2022.   She later underwent excision of presumptive osteomyelitis of fourth metatarsal on the right foot in 02/2022.    Cardiac risk factors diabetes type 1, severe PAD, history of carotid endarterectomy  CT results discussed with her in detail Calcium score 2343 ---CT FFR analysis showed significant stenosis in the mid LAD. 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 Moderate disease in left circumflex  Lab work reviewed A1c 8.5  Carotid u/s in [place]: july 2022  Echocardiogram reviewed,from [place] performed August 2022  normal ejection fraction, no valve disease, normal pressures  EKG personally reviewed by myself on todays visit Normal sinus rhythm rate 82 bpm no significant ST-T wave changes  PMH:   has a past medical history of Diabetes mellitus without complication (HCC) and Thyroid disease.  PSH:    Past Surgical History:  Procedure Laterality Date   BACK SURGERY     CAROTID ENDARTERECTOMY     FOOT AMPUTATION     FOOT SURGERY Right    LEFT HEART CATH AND CORONARY ANGIOGRAPHY N/A 10/24/2021   Procedure: LEFT HEART CATH AND CORONARY ANGIOGRAPHY;  Surgeon: Iran Ouch, MD;  Location: [facility];  Service: Cardiovascular;  Laterality: N/A;    Current Outpatient Medications  Medication Sig Dispense Refill   atorvastatin (LIPITOR) 40 MG tablet Take 1 tablet (40 mg total) by mouth daily. 90 tablet 3   B Complex Vitamins (B COMPLEX 1 PO) B Complex  calcium carbonate (OSCAL) 1500 (600 Ca) MG TABS tablet Take 1,200 mg of elemental calcium by mouth every evening.     Cholecalciferol 50 MCG (2000 UT) CAPS Take 2,000 Units by mouth daily at 6 (six) AM.     cyclobenzaprine (FLEXERIL) 5 MG tablet Take 5 mg by mouth at bedtime.     insulin lispro (HUMALOG) 100 UNIT/ML injection  Humalog U-100 Insulin 100 unit/mL subcutaneous solution  INJECT UP TO 50 UNITS UNDER THE SKIN DAILY WITH INSULIN PUMP     levothyroxine (SYNTHROID) 100 MCG tablet Take 100 mcg by mouth every evening.     lidocaine (LIDODERM) 5 % lidocaine 5 % topical patch     melatonin (MELATONIN MAXIMUM STRENGTH) 5 MG TABS Take 5 mg by mouth at bedtime.     midodrine (PROAMATINE) 5 MG tablet Take 1 tablet (5 mg total) by mouth daily as needed. For low BP 90 tablet 0   omega-3 acid ethyl esters (LOVAZA) 1 g capsule Take 1 g by mouth daily.     Omega-3 Fatty Acids (FISH OIL) 1000 MG CAPS Fish Oil     omeprazole (PRILOSEC) 40 MG capsule Take 40 mg by mouth every evening.     Vitamin E 670 MG (1000 UT) CAPS Take by mouth daily.     carbamide peroxide (DEBROX) 6.5 % OTIC solution Place 5 drops into the right ear 2 (two) times daily. (Patient not taking: Reported on 01/21/2023) 15 mL 0   No current facility-administered medications for this visit.   Allergies:   Aminoglycosides, Cranberry, Morphine and related, and Triprolidine-pseudoephedrine   Social History:  The patient  reports that she has been smoking cigarettes. She has a 25.00 pack-year smoking history. She has never used smokeless tobacco. She reports current drug use. Drug: Marijuana. She reports that she does not drink alcohol.   Family History:   family history includes CAD in her father; Dementia in her mother.   Review of Systems: Review of Systems  Constitutional: Negative.   HENT: Negative.    Respiratory:  Positive for shortness of breath.   Cardiovascular:  Positive for chest pain.  Gastrointestinal: Negative.   Musculoskeletal: Negative.   Neurological: Negative.        Head fullness  Psychiatric/Behavioral: Negative.    All other systems reviewed and are negative.   PHYSICAL EXAM: VS:  BP (!) 90/52 (BP Location: Left Arm, Patient Position: Sitting, Cuff Size: Normal)   Ht 5' 7.5" (1.715 m)   Wt 158 lb 4 oz (71.8 kg)   SpO2 93%    BMI 24.42 kg/m  , BMI Body mass index is 24.42 kg/m. Constitutional:  oriented to person, place, and time. No distress.  HENT:  Head: Grossly normal Eyes:  no discharge. No scleral icterus.  Neck: No JVD, no carotid bruits  Cardiovascular: Regular rate and rhythm, no murmurs appreciated Pulmonary/Chest: Clear to auscultation bilaterally, no wheezes or rails Abdominal: Soft.  no distension.  no tenderness.  Musculoskeletal: Normal range of motion Neurological:  normal muscle tone. Coordination normal. No atrophy Skin: Skin warm and dry Psychiatric: normal affect, pleasant  Recent Labs: 05/11/2022: BUN 19; Creatinine, Ser 1.21; Hemoglobin 9.0; Platelets 233; Potassium 4.2; Sodium 135    Lipid Panel Lab Results  Component Value Date   CHOL 146 09/18/2021   HDL 72 09/18/2021   LDLCALC 61 09/18/2021   TRIG 64 09/18/2021     Wt Readings from Last 3 Encounters:  01/21/23 158 lb 4 oz (71.8 kg)  10/21/22  157 lb 9.6 oz (71.5 kg)  10/09/22 160 lb 3.2 oz (72.7 kg)     ASSESSMENT AND PLAN:  Problem List Items Addressed This Visit       Cardiology Problems   Coronary artery disease - Primary   Relevant Medications   omega-3 acid ethyl esters (LOVAZA) 1 g capsule   Mixed hyperlipidemia   Relevant Medications   omega-3 acid ethyl esters (LOVAZA) 1 g capsule   Chronic orthostatic hypotension   Relevant Medications   omega-3 acid ethyl esters (LOVAZA) 1 g capsule     Other   Type 1 diabetes mellitus ([place])   Other Visit Diagnoses     Hyperlipidemia LDL goal <70       Relevant Medications   omega-3 acid ethyl esters (LOVAZA) 1 g capsule   Bilateral carotid artery disease, unspecified type (HCC)       Relevant Medications   omega-3 acid ethyl esters (LOVAZA) 1 g capsule   Type 1 diabetes mellitus with complications (HCC)       Carotid artery disease, unspecified laterality (HCC)       Relevant Medications   omega-3 acid ethyl esters (LOVAZA) 1 g capsule   PAD (peripheral  artery disease) (HCC)       Relevant Medications   omega-3 acid ethyl esters (LOVAZA) 1 g capsule     Coronary artery disease with stable angina Right arm pain and left upper epigastric discomfort is atypical in nature, less likely anginal equivalent No further workup at this time. Continue current medication regimen.  Muscle pain, cramping Has been told by pain clinic she might have fibromyalgia Recommend she hold the Lipitor for several weeks to see if symptoms improve If symptoms do improve may need alternate statin with Zetia or PCSK9 inhibitor  Peripheral arterial disease History of carotid endarterectomy on the right Will need carotid ultrasound in follow-up Last evaluated January 2023, no significant stenosis at that time  Diabetes type 1 with complications J8J elevated, Recommend close follow-up with primary care  Hyperlipidemia Holding Lipitor 40 to see if muscles symptoms improve Plan as above  Headaches Unclear if this is exacerbated from low blood pressure, recommend she monitor blood pressure when she has headaches, stay hydrated She does have midodrine as needed Blood pressure today 90 systolic    Total encounter time more than 30 minutes  Greater than 50% was spent in counseling and coordination of care with the patient  Signed, Esmond Plants, M.D., Ph.D. [place], [place]

## 2023-01-21 ENCOUNTER — Ambulatory Visit: Payer: Medicare Other | Attending: Cardiovascular Disease | Admitting: Cardiovascular Disease

## 2023-01-21 ENCOUNTER — Encounter: Payer: Self-pay | Admitting: Cardiovascular Disease

## 2023-01-21 VITALS — BP 90/52 | Ht 67.5 in | Wt 158.2 lb

## 2023-01-21 DIAGNOSIS — E108 Type 1 diabetes mellitus with unspecified complications: Secondary | ICD-10-CM | POA: Insufficient documentation

## 2023-01-21 DIAGNOSIS — E782 Mixed hyperlipidemia: Secondary | ICD-10-CM | POA: Diagnosis present

## 2023-01-21 DIAGNOSIS — E1010 Type 1 diabetes mellitus with ketoacidosis without coma: Secondary | ICD-10-CM | POA: Diagnosis present

## 2023-01-21 DIAGNOSIS — I779 Disorder of arteries and arterioles, unspecified: Secondary | ICD-10-CM | POA: Diagnosis not present

## 2023-01-21 DIAGNOSIS — I25118 Atherosclerotic heart disease of native coronary artery with other forms of angina pectoris: Secondary | ICD-10-CM | POA: Diagnosis not present

## 2023-01-21 DIAGNOSIS — I951 Orthostatic hypotension: Secondary | ICD-10-CM | POA: Diagnosis not present

## 2023-01-21 DIAGNOSIS — I739 Peripheral vascular disease, unspecified: Secondary | ICD-10-CM | POA: Diagnosis present

## 2023-01-21 DIAGNOSIS — E785 Hyperlipidemia, unspecified: Secondary | ICD-10-CM | POA: Insufficient documentation

## 2023-01-21 NOTE — Patient Instructions (Addendum)
Medication Instructions:  Do a little trial hold on the lipitor/atorvastatin 3 weeks for muscle soreness   If you need a refill on your cardiac medications before your next appointment, please call your pharmacy.   Lab work: No new labs needed  Testing/Procedures: No new testing needed  Follow-Up: At [facility], you and your health needs are our priority.  As part of our continuing mission to provide you with exceptional heart care, we have created designated Provider Care Teams.  These Care Teams include your primary Cardiologist (physician) and Advanced Practice Providers (APPs -  Physician Assistants and Nurse Practitioners) who all work together to provide you with the care you need, when you need it.  You will need a follow up appointment in 12 months  Providers on your designated Care Team:   Murray Hodgkins, NP Christell Faith, PA-C Cadence Kathlen Mody, [place]  COVID-19 Vaccine Information can be found at: [email or web address].uk For questions related to vaccine distribution or appointments, please email [email or web address] or call [phone].

## 2023-02-12 ENCOUNTER — Other Ambulatory Visit: Payer: Self-pay | Admitting: Cardiovascular Disease

## 2023-02-14 ENCOUNTER — Other Ambulatory Visit: Payer: Self-pay | Admitting: Internal Medicine

## 2023-02-14 MED ORDER — OMEPRAZOLE 40 MG PO CPDR: 40.0000 mg | DELAYED_RELEASE_CAPSULE | Freq: Every evening | ORAL | Status: DC

## 2023-02-14 NOTE — Telephone Encounter (Signed)
  omeprazole (PRILOSEC) 40 MG capsule   [facility] [id number] - [place], [place] AT [address] (Ph: [phone])

## 2023-02-25 ENCOUNTER — Other Ambulatory Visit: Payer: Self-pay | Admitting: Internal Medicine

## 2023-02-25 MED ORDER — OMEPRAZOLE 40 MG PO CPDR: 40.0000 mg | DELAYED_RELEASE_CAPSULE | Freq: Every evening | ORAL | Status: AC

## 2023-02-25 NOTE — Telephone Encounter (Addendum)
Omeprazole rx was written on 2/16 as "Print". Needs to be change to electronic/normal. Please re-send. I called pt - no answer; vm is full; unable to leave a message.

## 2023-02-25 NOTE — Telephone Encounter (Signed)
Pt states her pharmacy has not refilled the prescription yet and she would like to know why. The pt is requesting a nurse to call her back   omeprazole (PRILOSEC) 40 MG capsule [id number]         [facility], [place] [facility] [place] AT [facility] [address], [place] [address] Phone: [phone]  Fax: [phone]

## 2023-05-09 ENCOUNTER — Ambulatory Visit
Admission: RE | Admit: 2023-05-09 | Discharge: 2023-05-09 | Disposition: A | Discharge status: Home / Self Care | Payer: Medicare Other | Source: Ambulatory Visit | Attending: Physician Assistant | Admitting: Physician Assistant

## 2023-05-09 ENCOUNTER — Other Ambulatory Visit: Payer: Self-pay | Admitting: Physician Assistant

## 2023-05-09 DIAGNOSIS — L97519 Non-pressure chronic ulcer of other part of right foot with unspecified severity: Secondary | ICD-10-CM

## 2023-05-16 ENCOUNTER — Telehealth: Payer: Self-pay | Admitting: Cardiovascular Disease

## 2023-05-16 NOTE — Telephone Encounter (Signed)
Patient wants a call back to discuss ECG results.

## 2023-05-16 NOTE — Telephone Encounter (Signed)
Called and spoke with patient. Patient was had concerns about an EKG from 05/05/23 that she did recall having done.  While speaking with the patient the patient realized the EKG was done at [facility]. Patient states that she will call Romeo Apple to get the results of the EKG. Patient states that she has no other concerns at this time.

## 2023-06-04 ENCOUNTER — Encounter: Payer: Self-pay | Admitting: *Deleted

## 2023-06-23 ENCOUNTER — Telehealth (INDEPENDENT_AMBULATORY_CARE_PROVIDER_SITE_OTHER): Payer: Self-pay

## 2023-06-23 MED ORDER — MIDODRINE HCL 5 MG PO TABS
10.0000 mg | ORAL_TABLET | Freq: Every day | ORAL | 1 refills | Status: DC | PRN
Start: 2023-06-23 — End: 2023-12-15

## 2023-06-23 NOTE — Addendum Note (Signed)
Addended by: Ellard Artis. on: 06/23/2023 02:03 PM     Modules accepted: Orders

## 2023-06-23 NOTE — Telephone Encounter (Signed)
Pt was last seen by her cardiologist in [place], [place] in January 2024.  She had to move to [facility] temporarily to take care of the end of life care of her father who recently passed away.     She is back in [place] and will see Dr Dimas Aguas in December, the earliest that she can get an opening see him.  We couldn't place her with an app because she last saw Dr Dimas Aguas in 2022.    She needs a short term refill for her midodrine which is daily as needed for there orthostatic hypotension which is getting more pronounced in this hot weather despite adequate hydration.       BP's have been on the low end of normal.  She has been feeling light headed in the morning a couple weeks since arriving back in [place].  She was instructed to ask for a refill for her midodrine from [facility].     Currently asymptomatic.     Dr Dimas Aguas is not in clinic today.  I will ask if our DOD is ok with sending a short term refill.

## 2023-06-23 NOTE — Telephone Encounter (Signed)
LVM relaying that we refilled her midodrine PRN.

## 2023-08-09 IMAGING — CT CT HEART MORP W/ CTA COR W/ SCORE W/ CA W/CM &/OR W/O CM
1 of 16 series · 3 of 20 positions shown, 4 images · non-contrast
Comparison: 08/16/2021 chest radiograph.
COMPARISON: 08/16/2021 chest radiograph.

Addendum:
EXAM:
OVER-READ INTERPRETATION  CT CHEST

The following report is an over-read performed by radiologist Dr.
Indii Maureen [[email or web address]] on 09/27/2021. This over-read
does not include interpretation of cardiac or coronary anatomy or
pathology. The coronary CTA interpretation by the cardiologist is
attached.
CLINICAL DATA: Hx of angina
Cardiac/Coronary  CTA
TECHNIQUE: The patient was scanned on a Siemens Somatom go.Top scanner.

[Series 41: multiphase %_2 cta coronary 0.60 · axial · 0.36mm/px · z∈[-1118,-994]mm · 3 of 3421 slices shown, 4 images]
[im 1/3421  vessel]
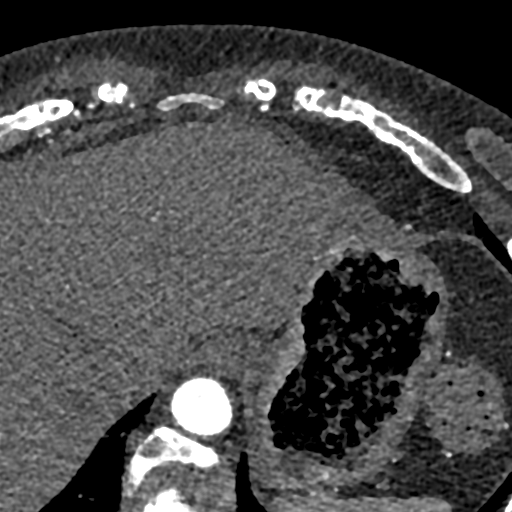
[im 1/3421  lung]
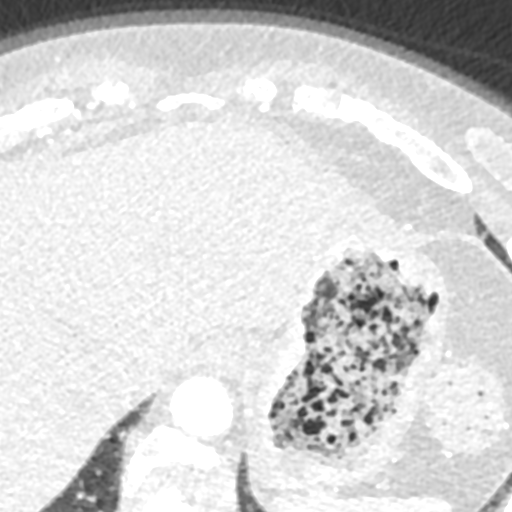
[im 1711/3421  vessel]
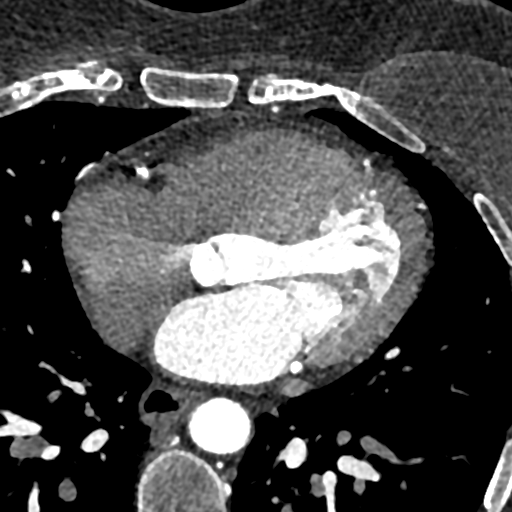
[im 3421/3421  vessel]
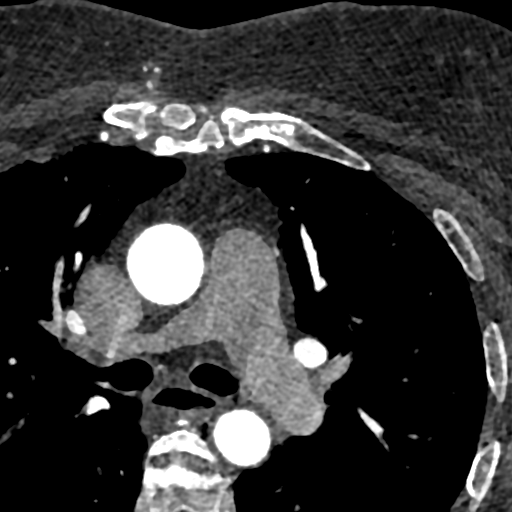

[3 of 20 positions shown; findings below may reference images not displayed]

FINDINGS: Vascular: Aortic atherosclerosis. No central pulmonary embolism, on
this non-dedicated study.

Mediastinum/Nodes: No imaged thoracic adenopathy. Esophageal fluid
level.

Lungs/Pleura: No pleural fluid. Minimal anterior upper lobe
subpleural interstitial thickening, most apparent on the right is
nonspecific.

Nodule along the right minor fissure measures 3 mm on [DATE].

Subpleural 4 mm right lower lobe pulmonary nodule on [DATE].

4 mm subpleural left lower lobe pulmonary nodule on [DATE].

Dependent atelectasis in both lower lobes.

Upper Abdomen: Normal imaged portions of the liver, spleen, stomach.

Musculoskeletal: Bilateral breast implants. Dorsal spinal
stimulator. No acute osseous abnormality.
IMPRESSION: 1.  No acute findings in the imaged extracardiac chest.
2. Bilateral pulmonary nodules, most likely subpleural lymph nodes.
No follow-up needed if patient is low-risk. Non-contrast chest CT
can be considered in 12 months if patient is high-risk. This
recommendation follows the consensus statement: Guidelines for
Management of Incidental Pulmonary Nodules Detected on CT Images:
3. Esophageal air fluid level suggests dysmotility or
gastroesophageal reflux.
4.  Aortic Atherosclerosis (ULFQ5-FCS.S).
:
A retrospective scan was triggered in the descending thoracic aorta.
Axial non-contrast 3 mm slices were carried out through the heart.
The data set was analyzed on a dedicated work station and scored
using the Agatson method. Gantry rotation speed was 330 msecs and
collimation was .6 mm. 50mg of metoprolol and 0.8 mg of sl NTG was
given. The 3D data set was reconstructed in 5% intervals of the
60-95 % of the R-R cycle. Diastolic phases were analyzed on a
dedicated work station using MPR, MIP and VRT modes. The patient
received 75 cc of contrast.
FINDINGS: Aorta:  Normal size.  No calcifications.  No dissection.

Aortic Valve:  Trileaflet.  No calcifications.

Coronary Arteries:  Normal coronary origin.  Right dominance.

RCA is a dominant artery. There is heavily calcified plaque
throughout the proximal to mid segments causing severe stenosis
(>70%).

Left main is a large artery that gives rise to LAD and LCX arteries.

LAD calcified plaque in the proxima to mid segments causing severe
stenosis.

LCX is a non-dominant artery. There is calcified plaque in the
proximal and mid segments causing moderate 50-69% stenosis.

Other findings:

Normal pulmonary vein drainage into the left atrium.

Normal left atrial appendage without a thrombus.

Normal size of the pulmonary artery.
IMPRESSION: 1. Coronary calcium score of 8444. This was 99th percentile for age
and sex matched control.

2. Normal coronary origin with right dominance.

3. Heavily calcified plaque in the RCA and LAD vessels causing
severe stenosis.

4. CAD-RADS 4 Severe stenosis. (70-99% or > 50% left main). Cardiac
catheterization is recommended.

5. Consider symptom-guided anti-ischemic pharmacotherapy as well as
risk factor modification per guideline directed care.

6. Additional analysis with CT FFR will be submitted and reported
separately.

*** End of Addendum ***
EXAM:
OVER-READ INTERPRETATION  CT CHEST

The following report is an over-read performed by radiologist Dr.
Indii Maureen [[email or web address]] on 09/27/2021. This over-read
does not include interpretation of cardiac or coronary anatomy or
pathology. The coronary CTA interpretation by the cardiologist is
attached.
FINDINGS: Vascular: Aortic atherosclerosis. No central pulmonary embolism, on
this non-dedicated study.

Mediastinum/Nodes: No imaged thoracic adenopathy. Esophageal fluid
level.

Lungs/Pleura: No pleural fluid. Minimal anterior upper lobe
subpleural interstitial thickening, most apparent on the right is
nonspecific.

Nodule along the right minor fissure measures 3 mm on [DATE].

Subpleural 4 mm right lower lobe pulmonary nodule on [DATE].

4 mm subpleural left lower lobe pulmonary nodule on [DATE].

Dependent atelectasis in both lower lobes.

Upper Abdomen: Normal imaged portions of the liver, spleen, stomach.

Musculoskeletal: Bilateral breast implants. Dorsal spinal
stimulator. No acute osseous abnormality.
IMPRESSION: 1.  No acute findings in the imaged extracardiac chest.
2. Bilateral pulmonary nodules, most likely subpleural lymph nodes.
No follow-up needed if patient is low-risk. Non-contrast chest CT
can be considered in 12 months if patient is high-risk. This
recommendation follows the consensus statement: Guidelines for
Management of Incidental Pulmonary Nodules Detected on CT Images:
3. Esophageal air fluid level suggests dysmotility or
gastroesophageal reflux.
4.  Aortic Atherosclerosis (ULFQ5-FCS.S).

## 2023-08-14 ENCOUNTER — Emergency Department: Payer: Medicare Other

## 2023-08-14 ENCOUNTER — Emergency Department
Admission: EM | Admit: 2023-08-14 | Discharge: 2023-08-15 | Disposition: A | Discharge status: Home / Self Care | Payer: Medicare Other | Attending: Emergency Medicine | Admitting: Emergency Medicine

## 2023-08-14 DIAGNOSIS — L03115 Cellulitis of right lower limb: Secondary | ICD-10-CM | POA: Insufficient documentation

## 2023-08-14 MED ORDER — ONDANSETRON HCL 4 MG/2ML IJ SOLN
4.0000 mg | Freq: Once | INTRAMUSCULAR | Status: AC
Start: 2023-08-14 — End: 2023-08-15
  Administered 2023-08-15: 4 mg via INTRAVENOUS
  Filled 2023-08-14: qty 2

## 2023-08-14 MED ORDER — KETOROLAC TROMETHAMINE 30 MG/ML IJ SOLN
15.0000 mg | Freq: Once | INTRAMUSCULAR | Status: AC
Start: 2023-08-14 — End: 2023-08-15
  Administered 2023-08-15: 15 mg via INTRAVENOUS
  Filled 2023-08-14: qty 1

## 2023-08-14 NOTE — Telephone Encounter (Signed)
[name] [name] [phone]) is a 57 y.o. female with history of diabetic foot ulcer -4th metatarsal forefoot ulcer right foot- followed by Dr. Lahoma Rocker. The patient contacted the [facility] pager today for concerns regarding soreness and dull pain in her right leg since yesterday. She notes redness, itching and warmness in the back of the leg and edema that extends from the ankle to below the knee, she refers that the pain increases when pressing the calf when asked to do so. In addition, she had stabbing chest pain yesterday and has had multiple episodes of nausea, emesis and subjective fever since. She denies SOB, She has not taken any medication to relieve the symptoms and does not take medications for any other condition at the moment.    The patient was advised to go to the nearest ED department as soon as possible to rule out infection vs DVT. The patient expressed she is at her home in [place] and takes care of her mother, reason why is unable to go at the moment. Emphasis was made on the importance of seeking medical attention as soon as possible, she refers understanding and agreement with the plan. The case was discussed with the chief resident on call Dr. Colin Broach.     Orlin Hilding, MD  General Surgery Resident   PGY-1

## 2023-08-15 DIAGNOSIS — R079 Chest pain, unspecified: Secondary | ICD-10-CM

## 2023-08-15 LAB — PT/INR
INR: 1 (ref 0.9–1.1)
PT: 11.6 s (ref 10.1–12.9)

## 2023-08-15 LAB — LAB USE ONLY - CBC WITH DIFFERENTIAL
Absolute Basophils: 0.06 10*3/uL (ref 0.00–0.08)
Absolute Eosinophils: 0 10*3/uL (ref 0.00–0.44)
Absolute Immature Granulocytes: 0.12 10*3/uL — ABNORMAL HIGH (ref 0.00–0.07)
Absolute Lymphocytes: 1.02 10*3/uL (ref 0.42–3.22)
Absolute Monocytes: 1.07 10*3/uL — ABNORMAL HIGH (ref 0.21–0.85)
Absolute Neutrophils: 16.05 10*3/uL — ABNORMAL HIGH (ref 1.10–6.33)
Absolute nRBC: 0 10*3/uL (ref <=0.00)
Basophils %: 0.3 %
Eosinophils %: 0 %
Hematocrit: 29.1 % — ABNORMAL LOW (ref 34.7–43.7)
Hemoglobin: 10.3 g/dL — ABNORMAL LOW (ref 11.4–14.8)
Immature Granulocytes %: 0.7 %
Lymphocytes %: 5.6 %
MCH: 31.4 pg (ref 25.1–33.5)
MCHC: 35.4 g/dL (ref 31.5–35.8)
MCV: 88.7 fL (ref 78.0–96.0)
MPV: 10.9 fL (ref 8.9–12.5)
Monocytes %: 5.8 %
Neutrophils %: 87.6 %
Platelet Count: 234 10*3/uL (ref 142–346)
Preliminary Absolute Neutrophil Count: 16.05 10*3/uL — ABNORMAL HIGH (ref 1.10–6.33)
RBC: 3.28 10*6/uL — ABNORMAL LOW (ref 3.90–5.10)
RDW: 12 % (ref 11–15)
WBC: 18.32 10*3/uL — ABNORMAL HIGH (ref 3.10–9.50)
nRBC %: 0 /100 WBC (ref <=0.0)

## 2023-08-15 LAB — HIGH SENSITIVITY TROPONIN-I: hs Troponin: 6.8 ng/L (ref <=14.0)

## 2023-08-15 LAB — COMPREHENSIVE METABOLIC PANEL
ALT: 18 U/L (ref 0–55)
AST (SGOT): 16 U/L (ref 5–41)
Albumin/Globulin Ratio: 1.2 (ref 0.9–2.2)
Albumin: 3.6 g/dL (ref 3.5–5.0)
Alkaline Phosphatase: 99 U/L (ref 37–117)
Anion Gap: 9 (ref 5.0–15.0)
BUN: 17 mg/dL (ref 7–21)
Bilirubin, Total: 0.8 mg/dL (ref 0.2–1.2)
CO2: 26 mEq/L (ref 17–29)
Calcium: 9.8 mg/dL (ref 8.5–10.5)
Chloride: 98 mEq/L — ABNORMAL LOW (ref 99–111)
Creatinine: 1.4 mg/dL — ABNORMAL HIGH (ref 0.4–1.0)
GFR: 43.4 mL/min/{1.73_m2} — ABNORMAL LOW (ref >=60.0)
Globulin: 3.1 g/dL (ref 2.0–3.6)
Glucose: 138 mg/dL — ABNORMAL HIGH (ref 70–100)
Potassium: 3.8 mEq/L (ref 3.5–5.3)
Protein, Total: 6.7 g/dL (ref 6.0–8.3)
Sodium: 133 mEq/L — ABNORMAL LOW (ref 135–145)

## 2023-08-15 LAB — ECG 12-LEAD
Atrial Rate: 98 {beats}/min
IHS MUSE NARRATIVE AND IMPRESSION: NORMAL
P Axis: 72 degrees
P-R Interval: 156 ms
Q-T Interval: 348 ms
QRS Duration: 84 ms
QTC Calculation (Bezet): 444 ms
R Axis: 79 degrees
T Axis: 66 degrees
Ventricular Rate: 98 {beats}/min

## 2023-08-15 LAB — SEDIMENTATION RATE: Sed Rate: 61 mm/Hr — ABNORMAL HIGH (ref <=20)

## 2023-08-15 LAB — LACTIC ACID: Lactic Acid: 0.8 mmol/L (ref 0.2–2.0)

## 2023-08-15 MED ORDER — CEPHALEXIN 500 MG PO CAPS
500.0000 mg | ORAL_CAPSULE | Freq: Four times a day (QID) | ORAL | 0 refills | Status: AC
Start: 2023-08-15 — End: 2023-08-22

## 2023-08-15 MED ORDER — VANCOMYCIN HCL 1 G IV SOLR
20.0000 mg/kg | Freq: Once | INTRAVENOUS | Status: DC
Start: 2023-08-15 — End: 2023-08-15
  Filled 2023-08-15: qty 2000

## 2023-08-15 MED ORDER — STERILE WATER FOR INJECTION IJ/IV SOLN (WRAP)
1.0000 g | Freq: Once | INTRAMUSCULAR | Status: AC
Start: 2023-08-15 — End: 2023-08-15
  Administered 2023-08-15: 1 g via INTRAVENOUS
  Filled 2023-08-15: qty 1000

## 2023-08-15 MED ORDER — ONDANSETRON 4 MG PO TBDP
4.0000 mg | ORAL_TABLET | Freq: Four times a day (QID) | ORAL | 0 refills | Status: DC | PRN
Start: 2023-08-15 — End: 2023-10-03

## 2023-08-15 NOTE — ED Provider Notes (Signed)
[facility] EMERGENCY DEPARTMENT  ATTENDING PHYSICIAN HISTORY AND PHYSICAL EXAM     Patient Name: [name] [name], [name] [name]  Department:EC ACCESS [facility]  Encounter Date:  08/14/2023  Attending Physician: [name] Dace, MD   Age: 57 y.o. female  Patient Room: 8/M 8  PCP: Doristine Section, MD          History of Presenting Illness:     Nursing Triage note: 57 yr old WF with hx of type 1 , PVD, osteomylitis  seen at [facility] , toe ampuation Dec 2016 , presents today for redness and warm to touchon back of Rt leg , states she called provider and was told to come to ED to R/O infection, pt staes this is causing n/V/Fever bone chills  "states this feels like shit"  Chief complaint: Foot Pain    [name] [name] is a 57 y.o. female type 2 diabetes, hypertension hyperlipidemia, osteomyelitis, chronic foot ulcer presents in the setting of fever, posterior leg pain as well as redness.  States that in context of fever began to have chills and had episode of nausea and vomiting.  Denies abdominal pain.  States that yesterday had transient chest tightness that is since resolved.  Denies any current chest pain or shortness of breath.  States pain with movement.  States current symptoms feel similar to prior episodes of osteomyelitis.          Review of Systems:  Physical Exam:     Review of Systems    Positive and negative ROS per above and in HPI. All other systems reviewed and negative.     Pulse 104  BP 163/76  Resp 18  SpO2 98 %  Temp 97.5 F (36.4 C)     Physical Exam  Vitals and nursing note reviewed.   Constitutional:       General: She is not in acute distress.  HENT:      Head: Normocephalic and atraumatic.      Mouth/Throat:      Mouth: Mucous membranes are moist.   Eyes:      Extraocular Movements: Extraocular movements intact.      Conjunctiva/sclera: Conjunctivae normal.      Pupils: Pupils are equal, round, and reactive to light.   Cardiovascular:      Rate and Rhythm: Regular rhythm. Tachycardia present.      Heart  sounds: Normal heart sounds.   Pulmonary:      Effort: Pulmonary effort is normal.      Breath sounds: Normal breath sounds.   Abdominal:      General: Abdomen is flat. There is no distension.      Palpations: Abdomen is soft.      Tenderness: There is no abdominal tenderness.   Musculoskeletal:         General: No swelling or deformity. Normal range of motion.      Cervical back: Normal range of motion and neck supple.      Comments: On the base of the right dorsum of the foot there is a chronic appearing ulcer does not go down to bone.  Additionally there is redness, warmth and mild swelling localized to the posterior calf, posterior ankle.  To the joint itself there is no significant swelling, no pain with axial loading.  There is no obvious wound appreciated.  The redness extends to the mid calf just below the popliteal area.   Skin:     General: Skin is warm.      Findings: No rash.  Neurological:      General: No focal deficit present.      Mental Status: She is alert and oriented to person, place, and time. Mental status is at baseline.   Psychiatric:         Mood and Affect: Mood normal.           Interpretations, Clinical Decision Tools and Critical Care:     O2 Sat:  The patient's oxygen saturation was 98 % on room air. This was independently interpreted by me as Normal.   EKG: I reviewed and Independently interpreted the patient's EKG as normal sinus at 98 with axis, intervals and segments that are normal for age.      XR Chest  AP Portable    Result Date: 08/15/2023   No definite acute abnormality. The lungs are well-expanded and clear. [name] Dibble, MD 08/15/2023 12:42 AM    Tibia Fibula Right AP and Lateral    Result Date: 08/15/2023   No acute abnormality. [name] Kindred, MD 08/15/2023 12:40 AM    Ankle Right 3+ Views    Result Date: 08/15/2023   No fracture or dislocation. The study shows no definite acute bony or joint abnormality. [name] Dibble, MD 08/15/2023 12:39 AM             Procedures:   Procedures       Medical Decision Making:            MDM    Patient presents in the setting of fever, chills, clinically appears to have cellulitis extending on the right posterior calf.  No fluctuance or fluid collection appreciated.  X-ray was done without gross signs of osteomyelitis.  Chronic foot ulcer noted however this does not appear to have extending cellulitis or abscess to that area.  Also did endorse some transient chest tightness yesterday, currently resolved, troponin negative, denies any current chest pain dyspnea do not suspect ACS or PE.  Inflammatory markers elevated has leukocytosis, clinically fits SIRS criteria.  In light of risk factors, symptoms, did advise IV antibiotics and admission for continued monitoring of cellulitis however patient stated that she needed to take care of her mother which she is here for.  Discussed risks and benefits of leaving including worsening cellulitis, death and disability, patient acknowledges risks, AMA paperwork signed.  Intends to follow-up tomorrow at [facility] as she gets her podiatry care there.         Medical Decision Making  Amount and/or Complexity of Data Reviewed  Labs: ordered.  Radiology: ordered.  ECG/medicine tests: ordered.    Risk  Prescription drug management.                  Diagnosis/Disposition:      Final diagnoses:   Cellulitis of right lower extremity     ED Disposition       ED Disposition   AMA    Condition   --    Date/Time   Fri Aug 15, 2023  1:12 AM    Comment   Date: 08/15/2023  Patient: [name] [name]  Admitted: 08/14/2023 10:46 PM  Attending Provider: Eldridge Dace, MD    [name] [name] has made the decision for the patient to leave the emergency department against the advice of  [name] [name]. She or her authorized [name] has been informed and understands the inherent risks, including death, infection.  She or her authorized [name] has decided to accept the responsibility for this decision. [name] [name] and  all  necessary part ies have been advised that she may return for further evaluation or treatment. Her condition at time of discharge was stable.  [name] [name] had current vital signs as follows:  BP 163/76   Pulse 104   Temp 98.3 F (36.8 C)   Resp 18   Wt 64.4  kg                Follow-Up Providers (if applicable)    [facility] - [facility]  [address]  [place] [address]  [phone]        Doristine Section, MD  [address]  [place] [address]  [phone]    In 1 day         Discharge Medication List as of 08/15/2023  1:13 AM        START taking these medications    Details   cephALEXin (KEFLEX) 500 MG capsule Take 1 capsule (500 mg) by mouth 4 (four) times daily for 7 days, Starting Fri 08/15/2023, Until Fri 08/22/2023, E-Rx      ondansetron (ZOFRAN-ODT) 4 MG disintegrating tablet Take 1 tablet (4 mg) by mouth every 6 (six) hours as needed for Nausea, Starting Fri 08/15/2023, E-Rx                  Attestations:     [name] Nam, MD MPH    Documentation Notes:  Parts of this note were generated by the Epic EMR system/ Dragon speech recognition and may contain inherent errors or omissions not intended by the user. Grammatical errors, random word insertions, deletions, pronoun errors and incomplete sentences are occasional consequences of this technology due to software limitations. Not all errors are caught or corrected.  My documentation is often completed after the patient is no longer under my clinical care. In some cases, the Epic EMR may pull updated results into the above documentation which may not reflect all results or information that were available to me at the time of my medical decision making.   If there are questions or concerns about the content of this note or information contained within the body of this dictation they should be addressed directly with the author for clarification.                  [name] Dace, MD  08/15/23 [phone]

## 2023-08-15 NOTE — Discharge Instructions (Addendum)
Return at any time to complete care. Please ensure that you have follow up with your doctor in 1-2 days.

## 2023-08-19 LAB — CULTURE BLOOD AEROBIC AND ANAEROBIC: Culture Blood: NO GROWTH

## 2023-08-22 NOTE — Progress Notes (Signed)
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), R 2nd-4th MT head resections (2022, [facility]), and R 4th MT excision (02/2022), who presented to [facility] on 08/14/23 for R leg pain and redness after blunt impact on soft coffee table, treated with ceftriaxone x1 dose for cellulitis but left AMA on Keflex, and then presented to [facility] on 8/19 for persistent R calf cellulitis, partially improved on ceftriaxone/flagyl, now reporting resolution of pain with addition of linezolid, though erythema still present more focally appearing like a skin abrasion, consistent with her falling against a cushioned coffee table prior to onset (ie like a rugburn). Would complete 7 day course of linezolid as suspect likely strep/staph infection. Not concerned for toe/foot OM at this time. Follow up as planned for Dr Lahoma Rocker for nonhealing plantar ulcer, which does not appear infected.    Problems:  - R leg cellulitis  - DM1     Recommendations:  - stop ceftriaxone 1 g daily  - conitnue linezolid 600 mg po bid for 5-7 days  - aquaphor to R calf     Thank you for involving [place] in the care of this patient.  Will sign off.    [name] Baumgarten, MD  ID attending    ______________________________________________________________    Interval: [name] [name]      Subjective: leg feels a whole lot better, doesn't hurt anymore. Did vomit this morning before the 2nd dose of linezolid, feels she can manage with it      ROS: no f/c/d/rash        Current Facility-Administered Medications:   .  acetaminophen (TYLENOL) tablet 975 mg, 975 mg, Oral, Q8H PRN, de Dinah Beers, Melba Coon., MD, 975 mg at 08/20/23 [phone]  .  aspirin chewable tablet 81 mg, 81 mg, Oral, Daily, Jesse Fall, MD, 81 mg at 08/22/23 1017  .  atorvastatin (LIPITOR) tablet 40 mg, 40 mg, Oral, Daily, de Fairchilds, Melba Coon., MD, 40 mg at 08/22/23 1016  .  dextrose 5 % in water  infusion, 42 mL/hr, Intravenous, Continuous PRN, Boothe, Latanya Maudlin, MD  .  dextrose 50 % injection 12.5 g, 12.5 g, Intravenous, Q15 Min PRN, Boothe, Latanya Maudlin, MD  .  dextrose 50 % injection 25 g, 25 g, Intravenous, Q15 Min PRN, Boothe, Latanya Maudlin, MD  .  glucagon [facility]) injection 1 mg, 1 mg, Intramuscular, Once PRN, Boothe, Latanya Maudlin, MD  .  insulin pump, external, , Subcutaneous, Continuous, Ricci Barker, MD, Given by Patient (insulin pump) at 08/22/23 1020  .  LEVOthyroxine (SYNTHROID) tablet 100 mcg, 100 mcg, Oral, Daily, Ricci Barker, MD, 100 mcg at 08/22/23 1017  .  linezolid (ZYVOX) tablet 600 mg, 600 mg, Oral, BID, Boothe, Latanya Maudlin, MD, 600 mg at 08/22/23 1111  .  melatonin tablet 10 mg, 10 mg, Oral, Nightly, Boothe, Latanya Maudlin, MD, 10 mg at 08/21/23 2225  .  MetoCLOpramide (REGLAN) tablet 5 mg, 5 mg, Oral, 4x Daily AC and at bedtime, Jesse Fall, MD, 5 mg at 08/22/23 1018  .  MetoCLOpramide (REGLAN) tablet 5 mg, 5 mg, Oral, BID PRN, Jesse Fall, MD, 5 mg at 08/22/23 1516  .  midodrine (PROAMATINE) tablet 10 mg, 10 mg, Oral, TID, Boothe, Latanya Maudlin, MD, 10 mg at 08/22/23 1515  .  ondansetron (ZOFRAN ODT) disintegrating tablet 4 mg, 4 mg, Oral, Q6H  PRN **OR** ondansetron (ZOFRAN) 4 mg/2 mL injection 4 mg, 4 mg, Intravenous, Q6H PRN, de La Rosa, Melba Coon., MD  .  oxyCODONE (ROXICODONE) immediate release tablet 5 mg, 5 mg, Oral, Q4H PRN, de La Rosa, Melba Coon., MD  .  polyethylene glycol [facility]) packet 17 g, 17 g, Oral, Daily PRN, de La Rosa, Melba Coon., MD  .  sennosides (SENOKOT) 8.6 mg tablet 2 tablet, 2 tablet, Oral, BID PRN, de Dinah Beers, Melba Coon., MD  .  traZODone (DESYREL) tablet 50 mg, 50 mg, Oral, Nightly PRN, Jesse Fall, MD, 50 mg at 08/21/23 2238  .  white petrolatum (AQUAPHOR) topical ointment, , Topical, BID, Boothe, Latanya Maudlin, MD, Given at 08/22/23 1016      PE:  Vitals:    08/22/23 1226   BP: 138/67   Pulse:    Resp: 19    Temp: 36.2 C (97.2 F)   SpO2: 98%       Physical Exam  Constitutional:       Appearance: Normal appearance.   Cardiovascular:      Rate and Rhythm: Normal rate and regular rhythm.      Heart sounds: No murmur heard.  Pulmonary:      Effort: Pulmonary effort is normal. No respiratory distress.      Breath sounds: Normal breath sounds.   Abdominal:      General: There is no distension.      Palpations: Abdomen is soft.   Musculoskeletal:      Right lower leg: No edema.      Left lower leg: No edema.   Skin:     Findings: No rash (R calf erythema and peelign skin like a rugburn, nontender, some excoriation, see pic).   Neurological:      General: No focal deficit present.   Psychiatric:         Mood and Affect: Mood normal.         Behavior: Behavior normal.         Labs:  Hematology  Recent Labs     08/20/23  1242   WBC 9.18   HGB 10.6*   HCT 31.7*   PLT 403*     Recent Labs     08/20/23  1242   MCV 92.2   RDW 12.0     Recent Labs     08/20/23  1242   IMMGRANPCT 0.4   NEUTROPCT 73.4*   LYMPHSPCT 16.2*   MONOPCT 8.4   EOSINOPCT 0.9*   BASOPCT 0.7   NEUTROABS 6.74   LYMPHOABS 1.49   MONOSABS 0.77   EOSINOABS 0.08*    Chemistry  Recent Labs     08/20/23  1242   NA 134*   K 4.1   CL 94*   CO2 25   BUN 13   CREATININE 1.34*   GLU 123*   CALCIUM 10.2   No results for input(s): "MG", "PHOS" in the last 72 hours.  Recent Labs     08/20/23  1242   PROT 7.4   ALBUMIN 4.0   BILITOT 0.4   AST 16   ALT 12   ALKPHOS 108          MICRO  Microbiology Results Last 10 Days       ** No results found for the last 240 hours. **            IMAGING  Reviewed

## 2023-08-23 NOTE — Discharge Summary (Signed)
DISCHARGE SUMMARY    [name] [name],[name] [name]  Address: [address] [place] [address]  MRN: [id number]  DOB: 1966-05-21  Acct#: [id number]  Admission Date: 08/17/2023  Discharge Date: 08/22/2023   Length of Stay: 4  Discharge Attending: Jesse Fall, MD  Discharge Service: Hospitalist Service    Discharge Unit: [facility] CLINICAL [facility] UNIT 5  Unit Phone #: [phone]  PCP: Pcp), No Pcp (Pt Has No     For full detail of all procedures, imaging studies, and laboratory findings from this encounter please access [facility] at [email or web address]. You may also access this information via [facility] at [email or web address].       Pending Labs       No Pending Labs               Active Medical Conditions/ Problem List  Hospital Problems             ICD-10-CM POA       Active Problems    * (Principal) Cellulitis of right lower extremity L03.115 Yes    Anemia, unspecified type D64.9 Unknown    Elevated alkaline phosphatase level R74.8 Unknown    History of coronary artery disease Z86.79 Not Applicable    History of hypothyroidism Z86.39 Not Applicable    History of peripheral arterial disease Z86.79 Not Applicable    Leukemoid reaction D72.823 Unknown       Procedures:  Procedures During This Admission       No Procedures Performed            History of Present Illness/Hospital Course:  (From admission H&P)  [name] [name] is a 57 year old female with a past medical history significant for type 1 diabetes, peripheral arterial disease, coronary artery disease, osteomyelitis with prior right metatarsal resections presenting with worsening pain, erythema from a right foot wound despite cephalexin.     The patient has a history of prior severe foot infections in the past for minimal trauma.  About 6 days prior to presentation, she had an episode where she tripped over an object at home.  She had progressively worsening right foot pain.  She had some vomiting and two episodes of  transient, sharp chest pain on the Wednesday prior to presentation and she had subjective at that time. She took a COVID test which was negative. She was seen at [facility] on 8/15, where she had a leukocytosis to 18.32, hemoglobin 10.3, ESR 61, creatinine 1.4, normal alkaline phosphatase.  X-ray of the right tibia/fibula, right ankle revealed no fracture, dislocation, soft tissue gas.  And a concerning exam for cellulitis.  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  She had been taking p.o. Keflex beginning on 8/16, with the last dose on 8/18, but returns in the context of worsening pain and redness in the right leg despite antibiotics. She denies any purulent drainage. She denies dyspnea, melena, hematochezia, bleeding, claudication. She does have increasing orthostasis where she has had to take her midodrine more frequently over the last 4 weeks. She contacted Dr. Lahoma Rocker who recommended she come to the hospital for admission given her history. She checks her feet daily and has been adherent with her insulin pump. She has diabetic neuropathy with chronic orthostasis and loss of sensation in the fingers and toes.      In the emergency department, the patient was afebrile, heart rate 99,  blood pressure 107/65, pulse ox 100% on room air.  White blood cell count was 10.68 (previously 17.55 on 8/16), alkaline phosphatase 121.  A right lower extremity venous duplex was performed which showed no DVT, but mild subcutaneous edema especially within the calf.     The patient received atorvastatin, ceftriaxone, continuation of the insulin pump, metronidazole, midodrine, Zofran.     She was seen by vascular surgery, who recommended admission for cellulitis, continuing IV antibiotics, duplex of the right lower extremity (venous).  Podiatry was consulted, who recommended consideration of an MRI of foot/ankle to assess  chronic osteomyelitis versus septic arthritis. She endorsed night sweats and vomiting on 8/19. She has no nausea, and she feels cold. She endorses improvement in the right leg pain.      Rx history: Lispro, Synthroid, midodrine     She was admitted at [facility] in November 2022 for a right plantar foot puncture wound, which has not been healing.  Of note, per the discharge summary "Patient was noncompliant with most treatment offered. patient was shouting at the nursing staff, ancillary staff, and MD. First day she was requesting to use her own insulin pump, discussed with her that this is against hospital protocol and patient safety issue, despite this she was adamant about using her own insulin pump. After having discussion with the patient, including RN, nurse supervisor, podiatry MD, and hospitalist she agreed to let us manage diabetes and stop insulin pump. Plan was to get an MRI or CT of the right foot to rule out osteomyelitis. Same night patient refused IV vancomycin because she stated that she has nausea vomiting from vancomycin. She also refused nightly Lantus. The next morning she had similar issue with need to use insulin pump. At one time she had her insulin pump attached again. She was disruptive, loud and using profanity towards staff members. Patient stated that she wants [place] save her foot, she stated we are not doing anything to save her foot. She stated we are only offering her antibiotics or amputation nothing in between. She wanted to be discharged and go to [facility] or [facility] in [place] where they had previously treated where they have used stem cell research for wound healing. She stated that "we are not doing everything we should to save her foot". I discussed with her that if we have alternative treatment for right foot infection, podiatry will be the team to answer her questions. I discussed with her that her right foot infection may be cellulitis or  osteomyelitis and may require antibiotics plus or minus surgical management. The MRI was back of the right foot and it was negative for osteomyelitis. Discussed with patient's sister who is an Charity fundraiser, she stated that patient is not satisfied with our treatment plan and she would like to be discharged. Patient was discharged because she was not compliant with treatments offered. Refer to nursing supervisor's note for further detail."  She had an MRI of the right forefoot which revealed no evidence of osteomyelitis, but mild periarticular bony edema along the plantar aspect of the second and third MTP joints, with evidence of a subacute to chronic nondisplaced fracture involving the great toe, proximal phalanx.  ABI performed which was 0.93 (mild).  Presented to [facility] health emergency department with complaints of drainage and swelling in her foot.  Prescribed ciprofloxacin and doxycycline     She then came to this hospital that same month for a second opinion, where she was  taken to the operating room for excision of presumptive osteomyelitis in the second metatarsal of the right foot, debridement of plantar wound including bone, right foot.  She also underwent excision of partial metatarsals 1 and 3 on the right, stage secondary closure, debridement of plantar wound including subcutaneous tissue, bone biopsy the second right metatarsal.  Infectious diseases was consulted, and she received a Hickman line without complications.  She was prescribed Ancef 2 g 3 times daily.  However, home care was still pending at the time of discharge, but the patient wanted to leave AGAINST MEDICAL ADVICE.  Bone cultures were positive for MSSA of the right second toe, though on repeat debridement culture margins were negative.     The patient was admitted in March 2023 to vascular surgery for debridement and resection of the right fourth metatarsal head.        HOSPITAL COURSE:  #Cellulitis of right lower extremity  #Peripheral arterial  disease  #History of osteomyelitis  #Anemia of chronic disease   About 6 days prior to presentation, she had an episode where she tripped over an object at home. She had progressively worsening right leg pain and swelling. She went to [facility] on 8/15 where X-Rays of lower right extremity were all normal and she was started on IV ceftriaxone for concern for cellulitis but was discharged AMA to return to taking care of her mother with dementia. Her blood cultures were negative. She was given an oral Keflex prescription as a 3 day course but [facility] ED on 8/18 for worsening pain and erythema of her right lower leg. In the emergency department she was afebrile and a RLE Venous duplex showed no DVT. Xrays of her right ankle and tibia/fibula were negative for any acute pathology. CXR was benign. She was seen by vascular surgery and podiatry who recommended cellulitis admission and IV antibiotics but no surgical intervention. She was started on IV ceftriaxone and PO Flagyl 500 mg BID.     On admission to medicine, she reported daily improvement in right lower leg swelling, erythema, and tenderness with the antibiotic regimen of IV ceftriaxone and PO Flagyl. She remained afebrile throughout admission. MRI of right lower extremity was initially ordered to rule out osteomyelitis but due to her nonfunctioning spinal stimulator which would need manual impedance testing prior to MRI and low clinical concern for osteomyelitis, Infectious Disease was consulted and recommended a 3 view Xray of the right foot instead. Right foot X-ray demonstrated no acute osseous changes. Infectious disease recommended switching from PO Flagyl to PO Linezolid 600 mg BID and stopping ceftriaxone on discharge. She was discharged on only PO Linezolid 600 mg BID for a total of a 10 day course (8/23-9/1 with last dose being the morning of 9/1).  Wound care was consulted and determined no wound dressing needed and to continue  using Aquaphor on irritated skin as needed.     To do:  - Linezolid (Zyvox) 600 mg BID for a total of a 10 day course (8/23-9/1 with only the morning dose on 9/1)  - Continue applying aquaphor/vaseline on injury twice a day as needed   - Follow up with after care clinic     #Type 1 diabetes mellitus with diabetic polyneuropathy  #Dysautonomia  Patient has history of Type 1 DM on insulin pump with CGM. Inpatient diabetes management team was consulted and selected insulin pump settings of Basal rate 0000-0630- 0.75 units/hr, 6301-6010- 0.9 units/hr, 2200-0000- 0.75 units/hr; Carb  Ratio 0000-0730 1 unit for every 12 gms, 0730-0000 1 unit for every 8 gms; ISF 1 unit for every 45 mg/dL over target of 914-782 mg/dL. Please continue insulin pump as per outpatient settings and follow up with outpatient endocrinologist.    To do:  - Continue using insulin pump as per outpatient settings.   - Follow-up with outpatient endocrinologist as needed   - Continue taking midodrine 10 mg TID for orthostatic hypotension and lightheadednesss. If blood pressures are increasing (140s/90s) consider downtitrating to 5 mg TID or stopping if dizziness has resolved    #Hypothyroidism  TSH in 08/2021 was 0.734. Thyroid studies were not collected on this admission. She was continued on synthroid 100 mcg daily.     To do:  - Continue on synthroid 100 mcg daily  - Follow-up with outpatient endocrinologist as needed    #Coronary artery disease  #Peripheral arterial disease   The patient does not have any claudication or any concern for vasculopathy at this juncture. She does have known coronary artery disease as found on her left heart catheterization. Most recent ABI was 0.93 (mild). Brief episodes of atypical chest pain prior to presentation on 8/14, but none currently. Aspirin was originally held in case of surgery but was restarted on the medicine service.    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 She reports that Melatonin 10 mg nightly is helpful for her.    To do:  - Continue Melatonin 10 mg nightly  - Counsel on sleep hygiene and regular sleep-wake hours if possible for her schedule     #Constipation  Patient remained constipated while in the hospital and did not have a bowel movement while admitted, though she did not complain of abdominal pain.    To do:  - If still constipated, please take Miralax 17g 1-2 times daily. If still constipated, can add 2 tablets of senna as needed.                    Medication List           * If you have questions about this list or if there are differences between this list and the labels on the medications you are using at home, please ask your pharmacist or provider.                STOP taking these medications      calcium carbonate 600 mg calcium (1,500 mg) tablet  Commonly known as: OS-CAL     insulin lispro 100 unit/mL injection  Commonly known as: HumaLOG     omega-3 acid ethyl esters 1 gram capsule  Commonly known as: LOVAZA     omeprazole 40 mg DR capsule  Commonly known as: PRILOSEC     promethazine 25 MG tablet  Commonly known as: PHENERGAN     VITAMIN B COMPLEX ORAL     vitamin E 180 mg (400 unit) Cap capsule            START taking these medications        Dose Instructions   acetaminophen 325 MG tablet  Last time this was given: 975 mg on August 20, 2023  9:43 AM  Commonly known as: TYLENOL  Take 2 tablets (650 mg total) by mouth every 6 (six) hours as needed for Pain.  Signed by: Calton Dach, MD     aspirin 81 mg chewable tablet  Last time this was given: 81 mg on August 22, 2023 10:17 AM   Chew and swallow 1 tablet (81 mg total) by mouth daily.  Signed by: Calton Dach, MD     linezolid 600 mg tablet  Last time this was given: 600 mg on August 22, 2023 11:11 AM  Commonly known as:  ZYVOX   Take 1 tablet (600 mg total) by mouth 2 (two) times daily for 9 days.  Signed by: Calton Dach, MD     melatonin 10 mg Tab  Last time this was given: 10 mg on August 21, 2023 10:25 PM   Take 1 tablet (10 mg total) by mouth nightly.  Signed by: Calton Dach, MD     MetoCLOpramide 5 MG tablet  Last time this was given: 5 mg on August 22, 2023  3:16 PM  Commonly known as: REGLAN   Take 1 tablet (5 mg total) by mouth 4 (four) times daily before meals and nightly.  Signed by: Calton Dach, MD     ondansetron 4 MG disintegrating tablet  Commonly known as: ZOFRAN ODT   Dissolve 1 tablet (4 mg total) by mouth every 6 (six) hours as needed for Nausea.  Signed by: Calton Dach, MD     pantoprazole 40 mg EC tablet  Commonly known as: PROTONIX   Take 1 tablet (40 mg total) by mouth daily.  Signed by: Calton Dach, MD     polyethylene glycol powder  Commonly known as: MIRALAX   Pour and mix 17 grams of powder to the 'fill line' inside the cap and mix in 8 ounces of liquid and drink by mouth once daily  Signed by: Calton Dach, MD     senna 8.6 mg tablet  Generic drug: sennosides   Take 2 tablets by mouth 2 (two) times daily as needed for Constipation.  Signed by: Calton Dach, MD     white petrolatum topical ointment  Last time this was given: August 22, 2023 10:16 AM  Commonly known as: AQUAPHOR   Apply topically 2 (two) times daily.  Signed by: Calton Dach, MD            CHANGE how you take these medications        Dose Instructions   LEVOthyroxine 100 MCG tablet  Last time this was given: 100 mcg on August 22, 2023 10:17 AM  Commonly known as: SYNTHROID  What changed:   how much to take  when to take this   Take 1 tablet (100 mcg total) by mouth once daily.  Signed by: Calton Dach, MD     midodrine 10 MG tablet  Last time this was given: 10 mg on August 22, 2023  3:15 PM  Commonly known as: PROAMATINE  What changed:   medication strength  how much to take  when to take this   Take 1 tablet (10 mg total) by mouth 3  (three) times daily.  Signed by: Calton Dach, MD            CONTINUE taking these medications        Dose Instructions   atorvastatin 40 MG tablet  Last time this was given: 40 mg on August 22, 2023 10:16 AM  Commonly known as: LIPITOR   Take 1 tablet (40 mg total)  by mouth daily.  Signed by: Calton Dach, MD     cholecalciferol 50 mcg (2,000 unit) capsule  Commonly known as: Vitamin D3   Take 1 capsule (2,000 Units total) by mouth daily.  Signed by: Calton Dach, MD               Exam at Discharge:   Height: 1.702 m (5\' 7" )  Weight: 59.6 kg (131 lb 6.3 oz)  Body mass index is 20.58 kg/m.  Temp: 36.2 C (97.2 F) (Temporal), BP: 138/67, HR: 86, RR: 19, SpO2: 98%, Pain Score: 0-No pain (08/22/23)       General: Comfortable-appearing and in no acute distress.  HEENT: Normocephalic and atraumatic. Mucosal membranes are moist.  Extremities/Musculoskeletal: Warm and well-perfused. No cyanosis or peripheral edema. Closed healing wound with scant erythema on R foot sole. Rapidly resolving erythema of posterior R calf.  Integument: Skin is warm and dry, lesions per above.  Neurological: Awake, alert, and fully oriented. No focal deficits.  Psychiatric: Pleasant affect and cooperative.                    Past Medical History:   Diagnosis Date   . Autonomic neuropathy    . Diabetes mellitus    . HTN (hypertension)    . Thyroid disease        Needs Interpreter?: No    Allergies/Adverse Reactions/Intolerances   Allergen Reactions   . Cranberry Hives     [facility]ner Allergy Text Annotation: Cranberry; pt states she has been drinking cranberry juice this admission with no allergic reaction   . Triprolidine-Pseudoephedrine Hives and Other (see comments)     "Actifed". Insomnia   . Aminoglycosides Nausea and vomiting     Mycin Drugs   . Pseudoephedrine    . Vancomycin Analogues Nausea and vomiting   . Morphine Hives and Itching       Condition/Functional Status at Discharge: Stable    Disposition:  Home or Self Care    Activity  Instructions       Discharge Activity      Recommended Activity/Restrictions: Resume your normal activities as tolerated            Nutrition Information:      During admission received: oral diet       Diet Instructions       Discharge Diet      Recommended Diet: Carbohydrate Controlled diet    You should limit your carbohydrate intake to: 60 grams per meal                 Additional Instructions    Please bring your discharge paperwork and all medications with you to your future appointments.    Dear Ms. Tuch,    You were hospitalized for an infection in your lower right leg. While you were here, we got Xrays of your lower right leg and foot that demonstrated no fractures and no bone infection. We gave you IV and oral antibiotics to help clear your infection. We also started you on medications to help improve your gastric motility.       Please see the attached paperwork for instructions about how to take your medications.  - Linezolid (Zyvox) 600 mg twice a day for a total of a 10 day course (8/23-9/1 with the morning dose on 9/1 being your last treatment)  - If the skin on your leg remains irritated, please apply Aquaphor or Vaseline to your skin twice a day  -  Please take Reglan 5 mg 4x daily for gastric motility and nausea with additional 5 mg tablets taken as needed for nausea and vomiting, but do not exceed 40 mg total per day.  - Please continue taking your midodrine 10 mg three times a day for lightheadedness. Please check your blood pressures daily and if you notice your blood pressures are high (140s/90s) then can decrease midodrine to 5 mg three times a day     We are scheduling you appointments with:  - Our aftercare clinic.    Please schedule yourself an appointment with:  - Your outpatient endocrinologist as needed    If we have made appointments for you that are not yet scheduled, you should receive a phone call within 3 days notifying you about the appointment. If you do not, please call  [phone] and ask to be transferred to the GENERAL MEDICINE Service.     Please call your doctor or return to the emergency room if you have persistent fever greater than 100.4 degrees or worsening swelling or pain in your leg.     It was a pleasure getting to know you over the past few days. Thank you for entrusting [place] with your care.    Sincerely,  The GENERAL MEDICINE Service Internal Medicine Team at [facility]                       Capacity and Advance Care Planning:  Capacity to Make Own Care Decisions:  Full capacity  Active HCDM    There are no active HCDM on file.         Electronic POLST Code Status on Discharge (Provider Orders for Life-Sustaining Treatment):             Scanned Advance Directives:  No Advance Directive on File    POLST Care Directives:      Active Lines at [place]       None                   Follow-Up Appointments:   Provider Follow Up Instructions     Pcp), No Pcp (Pt Has No .    Contact Info:  PT HAS NO PCP                       Future Appointments (up to 10 shown)      Aug 29, 2023 11:30 AM  (Arrive by 11:00 AM)  Hospital Follow Up with [facility] PROVIDER 1  [facility] (Facility Fee/[place])) [address]  [address]  [place] [address]  [phone]   Please arrive early to allow time for registration.          Immunizations:  Immunizations Administered for This Admission     None      Consultations:   Vascular Surgery- Signer: Sonda Rumble, MD, Cosigner: Black, Landis Martins, MD  Podiatry- Signer: Roylene Reason, DPM  Endocrinology- Signer: Jerelyn Charles, Amy, CRNP  Infectious Disease- Signer: Mariane Baumgarten, MD     Most Recent Labs and Selected Imaging:  CBC  Lab Results   Component Value Date    WBC 9.18 08/20/2023    RBC 3.44 (L) 08/20/2023    HGB 10.6 (L) 08/20/2023    HCT 31.7 (L) 08/20/2023    MCV 92.2 08/20/2023    MCHC 33.4 08/20/2023    MCH 30.8 08/20/2023  RDW 12.0 08/20/2023    PLT 403 (H) 08/20/2023    NRBC 0.00 08/20/2023       CBC WITH DIFF  Lab Results   Component Value Date    WBC 9.18 08/20/2023    RBC 3.44 (L) 08/20/2023    HGB 10.6 (L) 08/20/2023    HCT 31.7 (L) 08/20/2023    MCV 92.2 08/20/2023    MCHC 33.4 08/20/2023    MCH 30.8 08/20/2023    RDW 12.0 08/20/2023    PLT 403 (H) 08/20/2023    MPV 11.0 08/20/2023    NRBC 0.00 08/20/2023    LYMPHSPCT 16.2 (L) 08/20/2023    MONOPCT 8.4 08/20/2023    NEUTROPCT 73.4 (H) 08/20/2023    EOSINOPCT 0.9 (L) 08/20/2023    BASOPCT 0.7 08/20/2023    MONOSABS 0.77 08/20/2023    NEUTROABS 6.74 08/20/2023    EOSINOABS 0.08 (L) 08/20/2023     BMP  Lab Results   Component Value Date    NA 134 (L) 08/20/2023    K 4.1 08/20/2023    CL 94 (L) 08/20/2023    CO2 25 08/20/2023    BUN 13 08/20/2023    CREATININE 1.34 (H) 08/20/2023    GLU 123 (H) 08/20/2023    CALCIUM 10.2 08/20/2023    ANIONGAP 15 08/20/2023      CMP  Lab Results   Component Value Date    NA 134 (L) 08/20/2023    K 4.1 08/20/2023    CL 94 (L) 08/20/2023    CO2 25 08/20/2023    GLU 123 (H) 08/20/2023    BUN 13 08/20/2023    CREATININE 1.34 (H) 08/20/2023    CALCIUM 10.2 08/20/2023    PROT 7.4 08/20/2023    ALBUMIN 4.0 08/20/2023    BILITOT 0.4 08/20/2023    ALKPHOS 108 08/20/2023    AST 16 08/20/2023    ALT 12 08/20/2023    ANIONGAP 15 08/20/2023     MAG & PHOS  Lab Results   Component Value Date    MG 1.8 11/13/2021     COAG  Lab Results   Component Value Date    LABPT 10.3 08/20/2023    INR 0.97 08/20/2023    PTT 24.4 11/19/2021       Results for orders placed or performed during the hospital encounter of 08/17/23   US/VAS Duplex Lower Extremity Venous US Right    Collection Time: 08/18/23 10:19 AM    Narrative    EXAM: US/VAS DUPLEX LOWER EXTREMITY VENOUS RIGHT    INDICATION: Right lower extremity swelling and pain, concern for deep venous thrombosis    TECHNIQUE:  Grayscale, color and duplex Doppler ultrasound of the veins of the right lower extremity was  performed.    COMPARISON: No prior venous ultrasound images available for comparison    FINDINGS:    Right and left external iliac veins: Normal flow and phasicity.    Right Common Femoral vein: Normal compressibility and flow.  Right Great Saphenous vein at the saphenofemoral junction: Patent.    Right Proximal Profunda Femoral vein: Patent.    Right Femoral vein: Normal compressibility and flow.  Right Popliteal vein: Normal compressibility, flow and phasicity.  Posterior tibial veins: Adequately visualized and patent.   Peroneal veins: Adequately visualized and patent.    Additional findings: Mild subcutaneous edema especially within the calf.      Impression    IMPRESSION:  1.  No acute right femoropopliteal deep vein thrombosis.  2.  No deep vein thrombosis in the visualized calf veins.  3.  Mild subcutaneous edema especially within the calf.    Images and interpretation personally reviewed by: Buzzy Han, MD    Images and interpretation personally reviewed by: Ermalinda Memos, MD   XR Foot Right Minimum 3 VWS    Collection Time: 08/22/23  1:34 AM    Narrative    EXAM: XR Right foot 3 views    INDICATION: Patient with T1DM and right lower leg injury who failed outpatient Keflex. Imaging to r/o acute or chronic osteomyelitis or septic arthritis     TECHNIQUE: XR FOOT RIGHT MIN 3 VWS.     COMPARISON: 08/01/2023.    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 Screws across the PIP joints, with joint fusion, of the second through fourth toes, as before.  Unchanged osseous mineralization and alignment. No new fracture, fragmentation, or periosteal reaction is seen.       Impression    IMPRESSION:  No new/acute osseous pathology identified, within limits of radiography.    Images and interpretation personally reviewed by: Kelli Hope, MD    and US/VAS Duplex Lower Extremity Venous US Right    Result Date: 08/18/2023  EXAM: US/VAS DUPLEX LOWER  EXTREMITY VENOUS RIGHT INDICATION: Right lower extremity swelling and pain, concern for deep venous thrombosis TECHNIQUE: Grayscale, color and duplex Doppler ultrasound of the veins of the right lower extremity was performed. COMPARISON: No prior venous ultrasound images available for comparison FINDINGS: Right and left external iliac veins: Normal flow and phasicity. Right Common Femoral vein: Normal compressibility and flow. Right Great Saphenous vein at the saphenofemoral junction: Patent.  Right Proximal Profunda Femoral vein: Patent.  Right Femoral vein: Normal compressibility and flow. Right Popliteal vein: Normal compressibility, flow and phasicity. Posterior tibial veins: Adequately visualized and patent. Peroneal veins: Adequately visualized and patent. Additional findings: Mild subcutaneous edema especially within the calf.  , No results found.     COVID-19  Lab Results   Component Value Date    SARSCOV2 No RNA Detected 03/04/2022            Note: I have reviewed and updated, if indicated, any information recalled into this document from a prior document.     Time Spent on Discharge Activities:  40 minutes      Jesse Fall, MD  Assistant Professor of Medicine  Division of Hospital Medicine  [facility]   *Some images could not be shown.

## 2023-09-30 ENCOUNTER — Telehealth (INDEPENDENT_AMBULATORY_CARE_PROVIDER_SITE_OTHER): Payer: Self-pay

## 2023-09-30 NOTE — Telephone Encounter (Signed)
Called pt with Tre Loletha Grayer PA feedback. Pt confirms prior knowledge of avoiding taking midodrine 4-6 hours prior to bedtime and pt confirms she routinely follows this guidance. Pt will otherwise f/u on Friday as scheduled for further review of pt's symptomatic concerns. Pt verbalized understanding and has no further questions at this time.

## 2023-09-30 NOTE — Telephone Encounter (Signed)
Pt calling stating she was temporarily in [place], [place] with last OV with [facility] in 2022. Pt has moved back to [place] with c/o increased episodes of dizziness with low BP readings. WIth discussion of dizziness episodes, pt confirms dizziness related to orthostasis as occurring with positional changes. Pt states she use to take midodrine on an occasional basis but over the last three weeks has taken midodrine 10mg  at least daily and up to three times a day. Inquired if outside provider authorized the increase to her regimen vs pt noted increase in symptoms and therefore increased. Pt advises she self-increased as it relates to symptoms and is inquiring about max dosage in a day. Inquired if pt routinely monitors BPs which pt denies but states she does have an Omicron BP cuff that she is actively working to get set up. Pt states her last episode of dizziness with BP check occurred at a doctor's office visit at [facility] with BP 94/51. Pt understands her orthostatic management and states she is "chronically dehydrated" and drinks pedialyte and overall hydration to improve this element. Pt also has increased salt intake without improvement. Discussed use of compression stockings as well as arranging earlier OV (currently scheduled in December) with alternate provider for which pt in agreement. Arranged OV Friday (earlier opportunities declined due to pt's work scheduled) at [facility], address provided. Advised will otherwise review with AOD to understand pt's original question of max dosage of midodrine in a day and call pt back. Otherwise advised if pt having dizziness especially if escalates to pre-syncope or syncope that pt is not to drive. Pt verbalized understanding and has no further questions at this time.

## 2023-10-01 NOTE — Progress Notes (Signed)
[facility] HEART CARDIOLOGY OFFICE PROGRESS NOTE    HRT [facility] HEART [place] OFFICE -CARDIOLOGY  [address]  [place] [address]  Dept: [phone]  Dept Fax: [phone]       Patient Name: [name] [name], [name] [name]    Date of Visit:  October 03, 2023  Date of Birth: 1966/08/01  AGE: 57 y.o.  Medical Record #: [id number]  Requesting Physician: Doristine Section, MD      CHIEF COMPLAINT:   Chief Complaint   Patient presents with    Annual Exam    Dizziness        HISTORY OF PRESENT ILLNESS:    She is a 57 y.o. female who presents today for a followup visit.  She has previously been followed by Dr. Dimas Aguas.  Over the past few years she reports she was living in [place] and followed by cardiologist there though no records currently available.  She also reports she was evaluated at [facility] was diagnosed with orthostatic hypotension and has been placed on midodrine.  More recently she has noted symptoms of gastroparesis with persistent nausea and vomiting and continues to note intermittent episodes of dizziness.  She believes extensive cardiac testing was also performed while in [place].  She tells me another cardiac catheterization may have been performed with no obstructive disease identified.  She had a recent toe amputation with concerns of osteomyelitis and more recently has been hospitalized at [facility].    PAST MEDICAL HISTORY: She has a past medical history of Abnormal vision, Anemia, Autonomic neuropathy, Chicken pox, Chronic back pain, Difficulty walking, Disorder of musculoskeletal system, Echocardiogram (11/2017), Gastroesophageal reflux disease, Gastroparesis, Hyperlipidemia, Hypothyroidism, Low back pain, Measles, MPI (Nuclear) Study (2018), MRSA (methicillin resistant staph aureus) culture positive (2017), Mumps, Osteomyelitis (2018), Peripheral vascular disease, Renal insufficiency, and Type 2 diabetes mellitus, controlled. 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    ALLERGIES: Allergies[1]    MEDICATIONS:   Current Outpatient Medications:     aspirin 81 MG chewable tablet, Chew 1 tablet (81 mg) by mouth, Disp: , Rfl:     b complex vitamins tablet, Take 1 tablet by mouth daily, Disp: , Rfl:     Cholecalciferol (VITAMIN D3) 2000 UNIT capsule, Take 1 capsule (2,000 Units) by mouth daily, Disp: , Rfl:     HUMALOG 100 UNIT/ML injection, Inject 10,000 Units into the skin as needed, Disp: , Rfl:     Lancets (FREESTYLE) lancets, 1 each by Other route as needed, Disp: , Rfl:     levothyroxine (SYNTHROID, LEVOTHROID) 125 MCG tablet, Take 100 mcg by mouth nightly  , Disp: , Rfl:     midodrine (PROAMATINE) 5 MG tablet, Take 2 tablets (10 mg) by mouth daily as needed (for orthostatic hypotension) (Patient taking differently: Take 2 tablets (10 mg) by mouth 3 (three) times daily), Disp: 90 tablet, Rfl: 1    Omega-3 Fatty Acids (FISH OIL) 1000 MG Cap capsule, Take 1 capsule (1,000 mg) by mouth 2 (two) times daily, Disp: , Rfl:     vitamin E 1000 UNIT capsule, Take 1 capsule (1,000 Units) by mouth daily, Disp: , Rfl:     atorvastatin (LIPITOR) 20 MG tablet, Take 2 tablets (40 mg) by mouth daily, Disp: , Rfl:     omeprazole (PriLOSEC) 40 MG capsule, Take 40 mg by mouth daily (Patient not taking: Reported  on 10/03/2023), Disp: , Rfl:        SOCIAL HISTORY: She reports that she quit smoking about 6 years ago. Her smoking use included cigarettes. She started smoking about 36 years ago. She has never used smokeless tobacco. She reports current alcohol use of about 1.0 standard drink of alcohol per week. She reports current drug use. Drug: Marijuana.    PHYSICAL EXAMINATION    Visit Vitals  BP 115/60 (BP Site: Right arm, Patient Position: Sitting, Cuff Size: Small)   Pulse 88   Ht 1.727 m (5\' 8" )   Wt 60.8 kg  (134 lb)   BMI 20.37 kg/m     Last 3 Weights for the past 72 hrs (Last 3 readings):   Weight   10/03/23 1309 60.8 kg (134 lb)          Constitutional: Cooperative, alert, no acute distress.  Neck: No carotid bruits, JVP normal.  Cardiac: Regular rate and rhythm, normal S1 and S2; no S3 or S4, no murmurs, no rubs, no gallops.  Pulmonary: Clear to auscultation bilaterally, no wheezing, no rhonchi, no rales.  Extremities: no edema.  Vascular: +2 pulses in radial artery bilaterally    ECG: Normal sinus rhythm.  No significant ST-T wave changes    Labs:     Lab Results   Component Value Date    WBC 18.32 (H) 08/14/2023    HGB 10.3 (L) 08/14/2023    HCT 29.1 (L) 08/14/2023    PLT 234 08/14/2023     Lab Results   Component Value Date    GLU 138 (H) 08/14/2023    BUN 17 08/14/2023    CREAT 1.4 (H) 08/14/2023    NA 133 (L) 08/14/2023    K 3.8 08/14/2023    CL 98 (L) 08/14/2023    CO2 26 08/14/2023    AST 16 08/14/2023    ALT 18 08/14/2023     Lab Results   Component Value Date    HGBA1C 7.9 (A) 03/17/2018     August 2024 hemoglobin 10.3 platelets 234 BUN 17 creatinine 1.4 potassium 3.8 AST and ALT normal      Assessment:   Last seen by Dr. Dimas Aguas in 2022.  She reports she was living in [place] to help take care of family for a number of years and also received care at [facility] where she was diagnosed with orthostatic hypotension and more recently was hospitalized at [facility] in the setting of osteomyelitis.    Her Main concern at this time is in regard to orthostatic hypotension.  She has had issues with nausea and vomiting in the setting of gastroparesis and reports significant weight loss over the past few months.  She also reports she is poor with hydration.    She reports she was followed by cardiologist while in [place]-unfortunately none of these records are available for review    Last seen by Dr. Dimas Aguas in 2022  Carotid artery disease  Lower extremity microvascular  disease  Hyperlipidemia  Type 1 diabetes mellitus  Multiple coronary risk factors    Cellulitis, toe amputation-August 2024    Cardiac catheterization October 2022      Prox RCA to Mid RCA lesion is 40% stenosed.     Ost LM lesion is 30% stenosed.     Prox LAD to Mid LAD lesion is 40% stenosed.     1st Diag lesion is 30% stenosed.     The left ventricular systolic function is normal.  LV end diastolic pressure is mildly elevated.     The left ventricular ejection fraction is 55-65% by visual estimate.     1.  Heavily calcified coronary arteries with moderate two-vessel coronary   artery disease involving the LAD and RCA.  No evidence of obstructive   disease.   2. Normal LV systolic function and mildly elevated left ventricular   end-diastolic pressure.     Coronary CTA 2022 (false positive reading-see cardiac cath results above)    IMPRESSION:   1. Coronary calcium score of 2343. This was 99th percentile for age   and sex matched control.     2. Normal coronary origin with right dominance.     3. Heavily calcified plaque in the RCA and LAD vessels causing   severe stenosis.     4. CAD-RADS 4 Severe stenosis. (70-99% or > 50% left main). Cardiac   catheterization is recommended.     5. Consider symptom-guided anti-ischemic pharmacotherapy as well as   risk factor modification per guideline directed care.     6. Additional analysis with CT FFR will be submitted and reported   separately.     Echocardiogram 2018:  EF normal.  No significant valvular abnormalities (mild mitral annular calcification noted)  Carotid Doppler 2022:  No obstructive disease bilaterally    Carotid Doppler 2023:  Minimal plaque only    Treadmill stress test 2022:  GRADED ECG EXERCISE TEST:         ----- Protocol and Exercise Time -----   Protocol Bruce   Exercise Time (min) 6:00   ----- Heart Rate -----   Resting HR 75 bpm   Maximum HR 130 bpm   METS 7 METS   Percent Maximum Predicted HR 78 %    -----Blood Pressure -----   Resting BP  98/73mmHg   Maximum BP 180/80 mmHg      Patient Symptoms: L hip burning during exercise which resolved in recovery.     Reason for end: Leg pain     Resting ECG: Sinus Rhythm with isolated PVCs and early repolarization  Stress ECG: 1.5-2 mm of horizontal ST segment depression in the inferiolateral leads during peak exercise and early recovery that returned to baseline later in recovery.    Attempted cardiac nuclear study July 2022: Per available documentation  07/16/2021: Patient arrived for her MPI. Was very distressed about the test and lots of stress going on in her life. We started an IV and she stated that it hurt too much and wanted it out and did not want to proceed on with the test. Then she changed her mind, stating that she has moved to [place] in the last week to take care of her parents. She calmed down and stated that she would continue on with the test. I started another IV and injected her with the tracer. When I was flushing with saline, the iv was infiltrated. When that happened, she stated that she was just not ready to do the test. I offered her to have the rest pics done here today. But she could not commit to being back in 7 days. No images were taken today. She was injected with 10. of Myoview.     Plan:     She will continue on midodrine 10 mg 3 times daily  Importance of maintaining adequate hydration discussed  She has had significant weight loss recently which I suspect is also contributing.  She has had issues with nausea and vomiting that  she reports is secondary to her gastroparesis will continue to follow-up with her specialists  Increase sodium intake-could consider addition of Florinef  Maintain adequate hydration  Compression stockings  Echocardiogram  Prior cardiac records from her cardiologist in [place] would be helpful for review-she reports she will try to obtain records  Continued close follow-up with her endocrinologist  She reports she is planning to  establish care with a primary care physician  She is currently scheduled to follow-up with Dr. Dimas Aguas in December.    Signed by: Sandria Senter, MD                                                           Orders Placed This Encounter   Procedures    ECG 12 lead (Normal)    Echo 2D Complete       No orders of the defined types were placed in this encounter.      SIGNED:    Sandria Senter, MD          This note was generated by the Dragon speech recognition and may contain errors or omissions not intended by the user. Grammatical errors, random word insertions, deletions, pronoun errors, and incomplete sentences are occasional consequences of this technology due to software limitations. Not all errors are caught or corrected. If there are questions or concerns about the content of this note or information contained within the body of this dictation, they should be addressed directly with the author for clarification.         [1]   Allergies  Allergen Reactions    Sulfamethoxazole-Trimethoprim Thrombocytopenia     Other Reaction(s): Thrombocytopenia    Vancomycin Nausea And Vomiting    Cranberry Hives     Cerner Allergy Text Annotation: Cranberry; pt states she has been drinking cranberry juice this admission with no allergic reaction   Cerner Allergy Text Annotation: Cranberry; pt states she has been drinking cranberry juice this admission with no allergic reaction      Cerner Allergy Text Annotation: Cranberry; pt states she has been drinking cranberry juice this admission with no allergic reaction   Cerner Allergy Text Annotation: Cranberry; pt states she has been drinking cranberry juice this admission with no allergic reaction   Cerner Allergy Text Annotation: Cranberry; pt states she has been drinking cranberry juice this admission with no allergic reaction   Cerner Allergy Text Annotation: Cranberry; pt states she has been drinking cranberry juice this admission with no allergic reaction    Cerner Allergy  Text Annotation: Cranberry; pt states she has been drinking cranberry juice this admission with no allergic reaction   Cerner Allergy Text Annotation: Cranberry; pt states she has been drinking cranberry juice this admission with no allergic reaction      Cerner Allergy Text Annotation: Cranberry; pt states she has been drinking cranberry juice this admission with no allergic reaction   Cerner Allergy Text Annotation: Cranberry; pt states she has been drinking cranberry juice this admission with no allergic reaction   Cerner Allergy Text Annotation: Cranberry; pt states she has been drinking cranberry juice this admission with no allergic reaction   Cerner Allergy Text Annotation: Cranberry; pt states she has been drinking cranberry juice this admission with no allergic reaction   Cerner Allergy Text Annotation: Cranberry;  pt states she has been drinking cranberry juice this admission with no allergic reaction   Cerner Allergy Text Annotation: Cranberry; pt states she has been drinking cranberry juice this admission with no allergic reaction   Cerner Allergy Text Annotation: Cranberry; pt states she has been drinking cranberry juice this admission with no allergic reaction   Cerner Allergy Text Annotation: Cranberry; pt states she has been drinking cranberry juice this admission with no allergic reaction   Cerner Allergy Text Annotation: Cranberry; pt states she has been drinking cranberry juice this admission with no allergic reaction    Cerner Allergy Text Annotation: Cranberry; pt states she has been drinking cranberry juice this admission with no allergic reaction    Other      Actifed - hives and insomnia    ACUPHED   Actifed - hives and insomnia    Triprolidine-Pse Hives     Other reaction(s): Hives   "Actifed"   "Actifed"    Triprolidine-Pseudoephedrine Hives and Other (See Comments)     "Actifed"   "Actifed"   Other reaction(s): Hives   "Actifed"   "Actifed"   "Actifed"    Other reaction(s): Hives,  "Actifed"    Other reaction(s): Hives   "Actifed"    "Actifed". Insomnia    Aminoglycosides Nausea And Vomiting     Other Reaction(s): GI Intolerance    Mycin Drugs    Cranberry Extract Hives    Pseudoephedrine     Morphine Itching    Morphine And Codeine Hives and Itching

## 2023-10-02 ENCOUNTER — Encounter (INDEPENDENT_AMBULATORY_CARE_PROVIDER_SITE_OTHER): Payer: Self-pay

## 2023-10-03 ENCOUNTER — Ambulatory Visit (INDEPENDENT_AMBULATORY_CARE_PROVIDER_SITE_OTHER): Payer: Medicare Other | Admitting: Cardiovascular Disease

## 2023-10-03 ENCOUNTER — Encounter (INDEPENDENT_AMBULATORY_CARE_PROVIDER_SITE_OTHER): Payer: Self-pay | Admitting: Cardiovascular Disease

## 2023-10-03 VITALS — BP 115/60 | HR 88 | Ht 68.0 in | Wt 134.0 lb

## 2023-10-03 DIAGNOSIS — I2510 Atherosclerotic heart disease of native coronary artery without angina pectoris: Secondary | ICD-10-CM

## 2023-10-03 DIAGNOSIS — I951 Orthostatic hypotension: Secondary | ICD-10-CM

## 2023-10-03 LAB — ECG 12-LEAD
Atrial Rate: 88 {beats}/min
IHS MUSE NARRATIVE AND IMPRESSION: NORMAL
P Axis: 48 degrees
P-R Interval: 170 ms
Q-T Interval: 364 ms
QRS Duration: 76 ms
QTC Calculation (Bezet): 440 ms
R Axis: 75 degrees
T Axis: 70 degrees
Ventricular Rate: 88 {beats}/min

## 2023-10-17 ENCOUNTER — Other Ambulatory Visit (INDEPENDENT_AMBULATORY_CARE_PROVIDER_SITE_OTHER): Payer: Self-pay

## 2023-10-17 DIAGNOSIS — I2510 Atherosclerotic heart disease of native coronary artery without angina pectoris: Secondary | ICD-10-CM

## 2023-10-27 ENCOUNTER — Other Ambulatory Visit (INDEPENDENT_AMBULATORY_CARE_PROVIDER_SITE_OTHER): Payer: Self-pay

## 2023-10-27 DIAGNOSIS — I739 Peripheral vascular disease, unspecified: Secondary | ICD-10-CM

## 2023-10-28 ENCOUNTER — Other Ambulatory Visit (INDEPENDENT_AMBULATORY_CARE_PROVIDER_SITE_OTHER): Payer: Self-pay | Admitting: Cardiovascular Disease

## 2023-11-20 ENCOUNTER — Other Ambulatory Visit (INDEPENDENT_AMBULATORY_CARE_PROVIDER_SITE_OTHER): Payer: Self-pay | Admitting: Cardiovascular Disease

## 2023-11-21 LAB — LIPOPROTEIN A (LPA): Lipoprotein (a): 11 nmol/L

## 2023-12-01 ENCOUNTER — Ambulatory Visit (INDEPENDENT_AMBULATORY_CARE_PROVIDER_SITE_OTHER): Payer: Medicare Other

## 2023-12-01 DIAGNOSIS — I2510 Atherosclerotic heart disease of native coronary artery without angina pectoris: Secondary | ICD-10-CM

## 2023-12-01 DIAGNOSIS — I951 Orthostatic hypotension: Secondary | ICD-10-CM

## 2023-12-02 ENCOUNTER — Encounter (INDEPENDENT_AMBULATORY_CARE_PROVIDER_SITE_OTHER): Payer: Self-pay | Admitting: Cardiovascular Disease

## 2023-12-02 LAB — ECHO ADULT TTE COMPLETE
AV Area (Cont Eq VTI): 2.272
AV Mean Gradient: 5
AV Peak Velocity: 1.49
Ao Root Diameter (2D): 3.1
BP Mod LV Ejection Fraction: 59
IVS Diastolic Thickness (2D): 1
LA Dimension (2D): 3.8
LA Volume Index (BP A-L): 23
LVID diastole (2D): 4.1
LVID systole (2D): 2.8
MV E/A: 0.545
MV E/e' (Average): 7.941
Mitral Valve Findings: NORMAL
Prox Ascending Aorta Diameter: 3.1
Pulmonary Valve Findings: NORMAL
RV Basal Diastolic Dimension: 3
TAPSE: 1.8
Tricuspid Valve Findings: NORMAL

## 2023-12-09 ENCOUNTER — Ambulatory Visit (INDEPENDENT_AMBULATORY_CARE_PROVIDER_SITE_OTHER): Payer: Medicare Other | Admitting: Cardiovascular Disease

## 2023-12-15 ENCOUNTER — Ambulatory Visit (INDEPENDENT_AMBULATORY_CARE_PROVIDER_SITE_OTHER): Payer: Medicare Other | Admitting: Cardiovascular Disease

## 2023-12-15 ENCOUNTER — Encounter (INDEPENDENT_AMBULATORY_CARE_PROVIDER_SITE_OTHER): Payer: Self-pay | Admitting: Cardiovascular Disease

## 2023-12-15 VITALS — BP 124/82 | HR 78

## 2023-12-15 DIAGNOSIS — I951 Orthostatic hypotension: Secondary | ICD-10-CM

## 2023-12-15 DIAGNOSIS — I739 Peripheral vascular disease, unspecified: Secondary | ICD-10-CM

## 2023-12-15 DIAGNOSIS — I2510 Atherosclerotic heart disease of native coronary artery without angina pectoris: Secondary | ICD-10-CM

## 2023-12-15 DIAGNOSIS — I6523 Occlusion and stenosis of bilateral carotid arteries: Secondary | ICD-10-CM

## 2023-12-15 NOTE — Progress Notes (Signed)
 [place] HEART CARDIOLOGY OFFICE PROGRESS NOTE    HRT [facility] [place] [facility] OFFICE -CARDIOLOGY  [address]  [place] [address]  Dept: [phone]  Dept Fax: [phone]       Patient Name: [name] [name]    Date o

## 2024-01-14 ENCOUNTER — Ambulatory Visit (INDEPENDENT_AMBULATORY_CARE_PROVIDER_SITE_OTHER): Payer: Medicare Other

## 2024-01-14 DIAGNOSIS — I2510 Atherosclerotic heart disease of native coronary artery without angina pectoris: Secondary | ICD-10-CM

## 2024-01-14 DIAGNOSIS — I739 Peripheral vascular disease, unspecified: Secondary | ICD-10-CM

## 2024-01-14 DIAGNOSIS — I951 Orthostatic hypotension: Secondary | ICD-10-CM

## 2024-01-14 DIAGNOSIS — I6523 Occlusion and stenosis of bilateral carotid arteries: Secondary | ICD-10-CM

## 2024-01-20 ENCOUNTER — Telehealth (INDEPENDENT_AMBULATORY_CARE_PROVIDER_SITE_OTHER): Payer: Self-pay

## 2024-01-20 DIAGNOSIS — I6523 Occlusion and stenosis of bilateral carotid arteries: Secondary | ICD-10-CM

## 2024-01-20 NOTE — Telephone Encounter (Addendum)
 Called patient, relayed carotid duplex results per Dr. Jerrie. Patient verbalized understanding. Rec cont meds as prescribed (on aspirin/statin per chart) and RFM/healthy lifestyle. Repeat study ordered.     ----- Message from Ian DELENA Jerrie sent at 01/19/2024 11:35 PM EST -----  Regarding: Carotid  Hey Sam    Can you let this patient know the results of their carotid     R sided CEA site is patent  L side with moderate plaque that is < 50% stenosis     Can you order a repeat for 12 mon     Thanks  Aditi

## 2024-02-02 ENCOUNTER — Other Ambulatory Visit: Payer: Self-pay

## 2024-02-05 ENCOUNTER — Inpatient Hospital Stay: Admit: 2024-02-05 | Payer: Self-pay | Admitting: Foot & Ankle Surgery

## 2024-03-17 ENCOUNTER — Other Ambulatory Visit: Payer: Self-pay | Admitting: Physical Medicine & Rehabilitation

## 2024-03-17 DIAGNOSIS — M5416 Radiculopathy, lumbar region: Secondary | ICD-10-CM

## 2024-03-22 IMAGING — DX DG FOOT COMPLETE 3+V*R*
3 series · 3 of 3 positions shown · non-contrast
Comparison: 12/14/2021

CLINICAL DATA: Pain

EXAM:
RIGHT FOOT COMPLETE - 3+ VIEW

[foot ap]
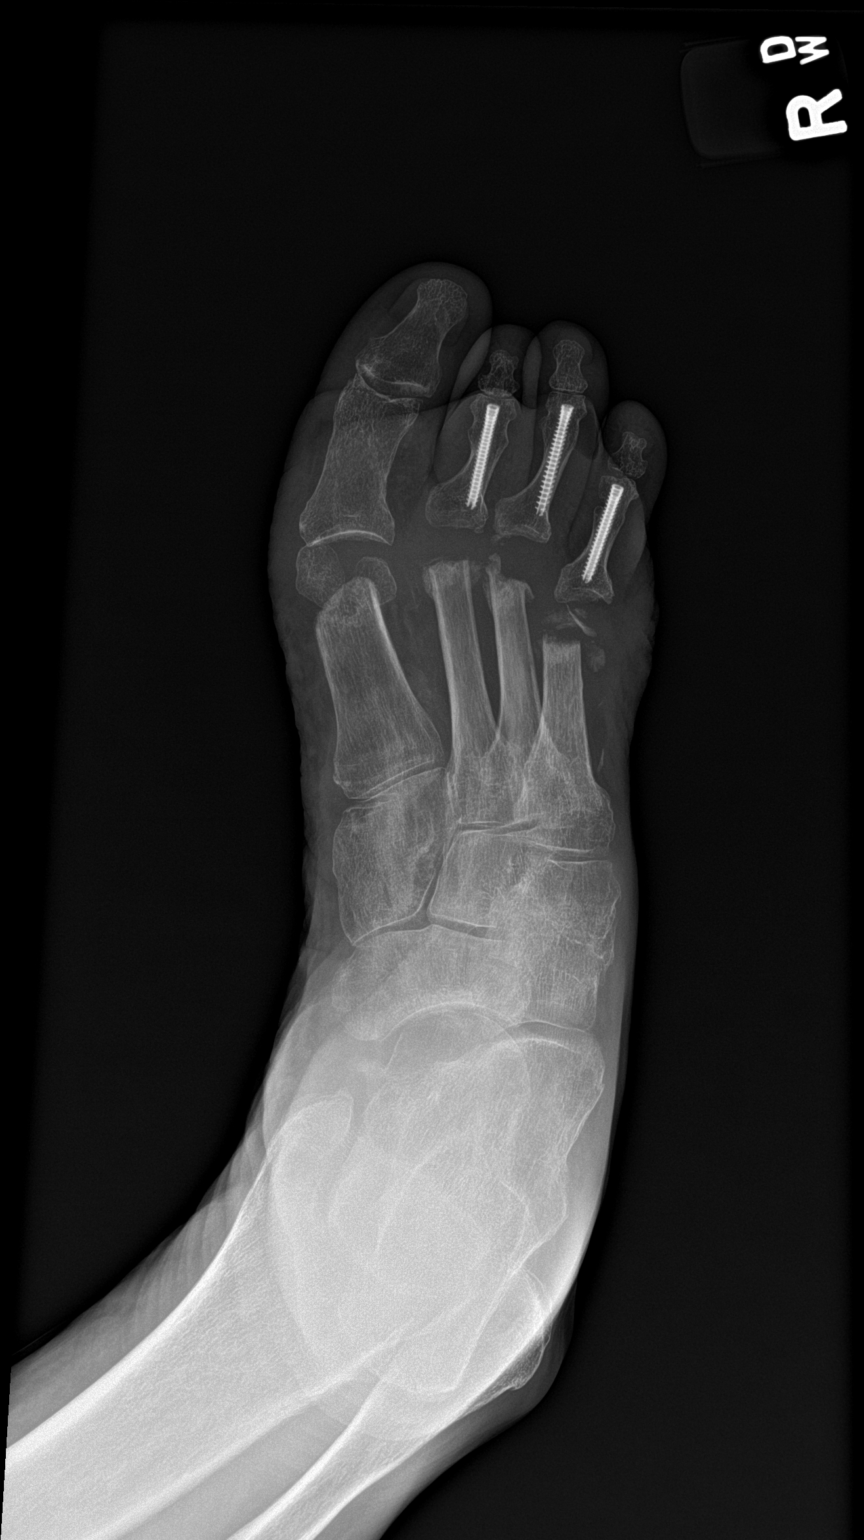

[foot obl]
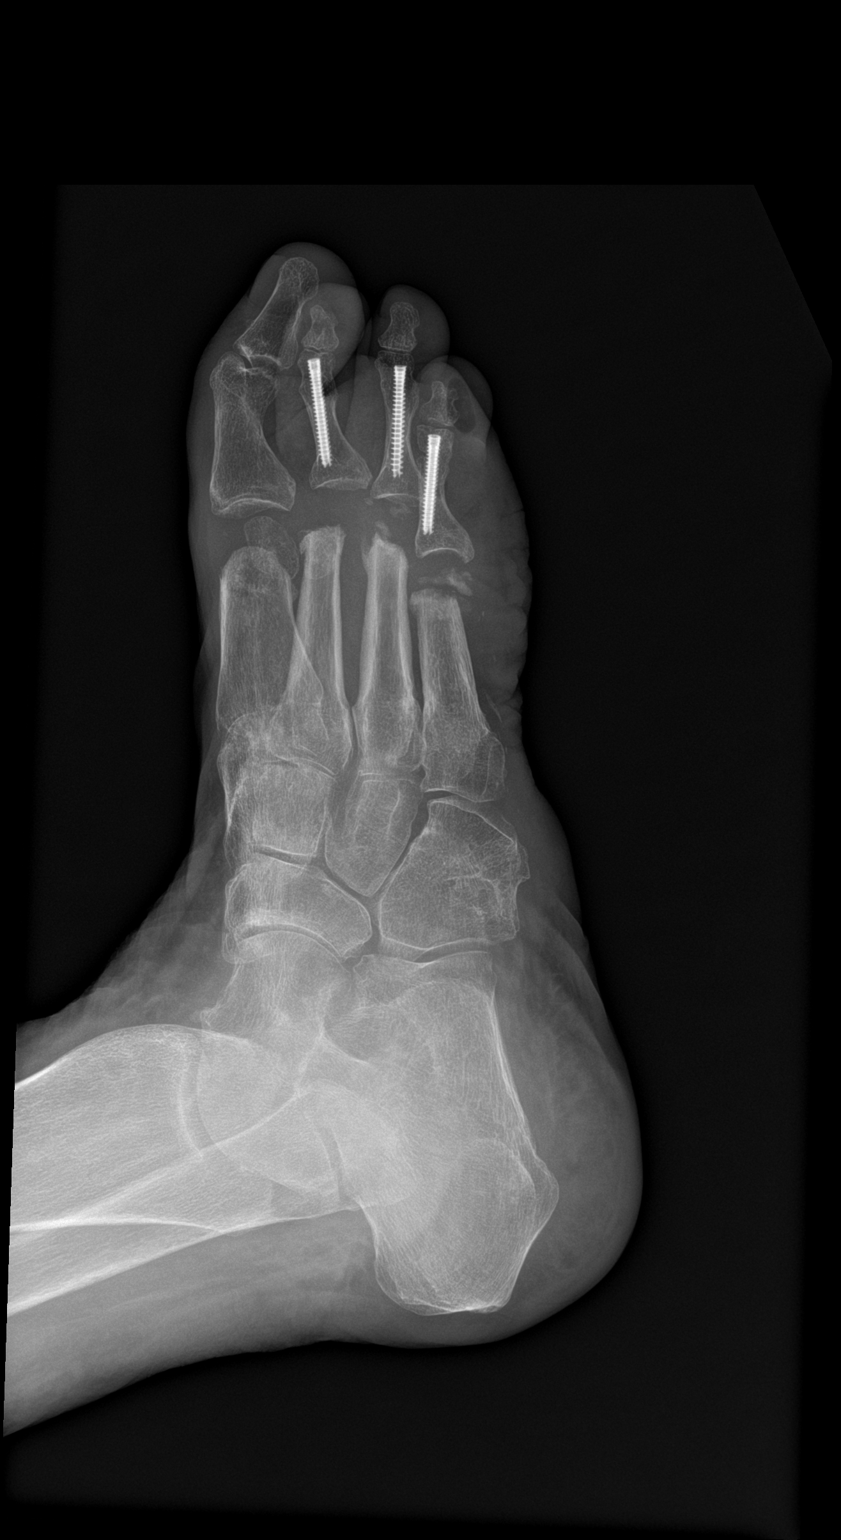

[foot lat]
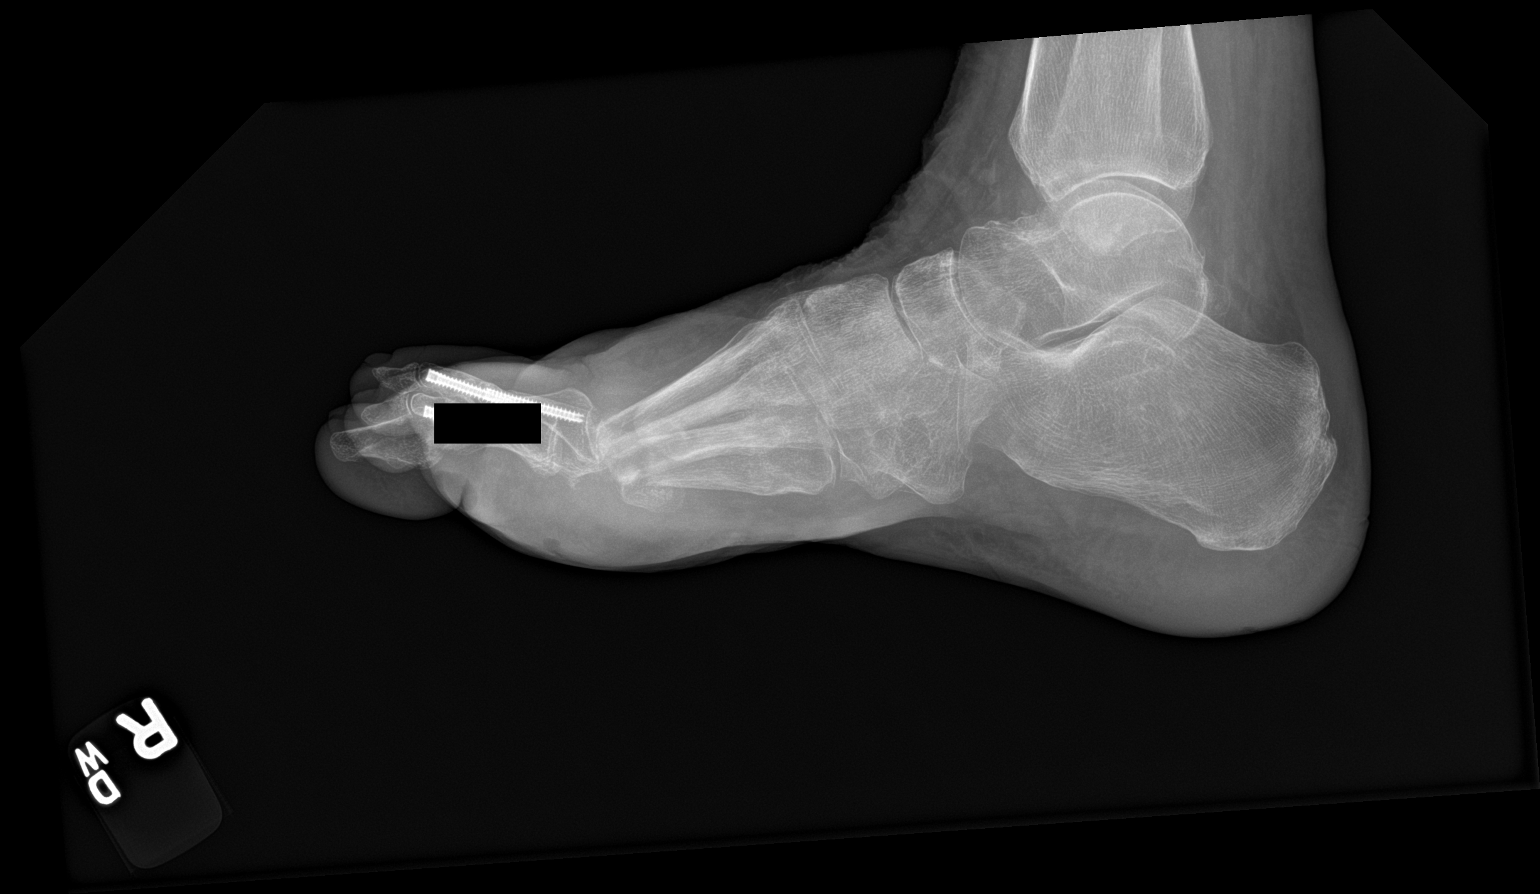

[3 of 3 positions shown; findings below may reference images not displayed]

FINDINGS: Prior section of the right 5th toe including metatarsal. Prior
resection of the 1st through 4th distal metatarsals. Screw fixation
across the PIP joints of the 2nd through 4th toes. No evidence of
acute osteomyelitis. Soft tissues are intact.
IMPRESSION: No acute bony abnormality.

## 2024-04-02 ENCOUNTER — Ambulatory Visit

## 2024-05-11 ENCOUNTER — Ambulatory Visit

## 2024-05-15 ENCOUNTER — Ambulatory Visit

## 2024-09-14 NOTE — Progress Notes (Signed)
 Adult OT Outpatient Evaluation    Date of Service: 09/14/2024  Start Time of Service: 1445    Name: [name] [name],[name] [name]  MRN: [id number]  DOB: 24-Mar-1966    Prior to Treatment:  Visit Number: 1  Evaluation Completed on: 09/14/24  Reassessment Completed On:      Compliance Dates  Insurance Authorization Information: Medicare  Certification Date From: 09/14/24  Certification Date To: 12/13/24       Referring Provider: PROVIDER NOT IN SYSTEM,   Chief Complaint for this Visit: non-healing right plantar wound  Patient's Therapy Goal (OT): Promote healing of plantar wound  HPI for this Visit: Patient is a 58 y.o. with new plantar foot wound under cuboid area. Patient has history of partial foot amputation with a tendency to bear weight through lateral side of foot. Patient unable to offload area with mobility. She does not use an assistive device for walking except for her Cam boot.  Onset Date: 08/24/24     Initial Appearance/General Observation: Patient pleasant and cooperative  Referral Diagnosis: Osteomyelitis  Diabetic infection of right foot  Treatment Diagnosis: The encounter diagnosis was Plantar ulcer of right foot, unspecified ulcer stage. Right plantar wound  Prior Rehab Services:    Previous OT Treatment: outpatient  Subjective: Patient last seen April 2024 with healing progressing. However, patient has had new issues since amputation.  Handedness: Right  Comments:          Pain Assessment  Pain Assessment: Wong-Baker FACES  Pain Score: 2   Wong-Baker FACES Pain Rating: Hurts little bit  Pain Details  Pain Location: Foot  Pain Orientation: Right  Pain Descriptors: Aching, Sore, Throbbing, Tender  Pain Frequency: Intermittent  Pain Onset: On-going  Effect of Pain on Daily Activities: Difficulty with ADL  What makes your pain worse?: Walking  What makes your pain better?: Positioning  Precautions:  OT Precautions  Relevant Precautions for Rehab: per diagnosis     Social History     Socioeconomic History   . Marital  status: Married     Spouse name: Not on file   . Number of children: Not on file   . Years of education: Not on file   . Highest education level: Not on file   Occupational History   . Not on file   Tobacco Use   . Smoking status: Former     Types: Cigarettes   . Smokeless tobacco: Former   Advertising Account Planner   . Vaping status: Unknown   Substance and Sexual Activity   . Alcohol use: Not Currently   . Drug use: Yes     Types: Marijuana   . Sexual activity: Not Currently   Other Topics Concern   . Not on file   Social History Narrative   . Not on file     Social Drivers of Health     Financial Resource Strain: Low Risk  (09/09/2022)    Received from [facility]    Overall Financial Resource Strain (CARDIA)    . Difficulty of Paying Living Expenses: Not hard at all   Food Insecurity: No Food Insecurity (02/08/2024)    Hunger Vital Sign    . Worried About Programme Researcher, Broadcasting/film/video in the Last Year: Never true    . Ran Out of Food in the Last Year: Never true   Transportation Needs: No Transportation Needs (02/08/2024)    Transportation    . Lack of Transportation: No   Physical Activity: Insufficiently Active (09/09/2022)    Received  from [facility]    Exercise Vital Sign    . On average, how many days per week do you engage in moderate to strenuous exercise (like a brisk walk)?: 2 days    . On average, how many minutes do you engage in exercise at this level?: 40 min   Stress: No Stress Concern Present (09/09/2022)    Received from [facility] - Occupational Stress Questionnaire    . Feeling of Stress : Not at all   Social Connections: Moderately Isolated (09/09/2022)    Received from [facility]    Social Connection and Isolation Panel    . In a typical week, how many times do you talk on the phone with family, friends, or neighbors?: More than three times a week    . How often do you get together with friends or relatives?: Never    . How often do you attend church or religious services?:  Never    . Do you belong to any clubs or organizations such as church groups, unions, fraternal or athletic groups, or school groups?: No    . How often do you attend meetings of the clubs or organizations you belong to?: Never    . Are you married, widowed, divorced, separated, never married, or living with a partner?: Married   Housing Stability: Low Risk (02/08/2024)    Housing Stability    . Unstable Housing in the Last Year: No       Home Living and Occupational ProfileHome   Setup:                  Vision          Cognition         Client Factorsndings  Skin Integrity  Skin Integrity: lateral plantar heel wound under cuboid, tends to weightbear over lateral foot  ROM      Strength     Coordination     ADL/Mobility             Specialties Assessment: Splint Evaluation  Splinting  Splinting for: Right Lower Extremity  Skin Condition: Erythema  Sensation: Impaired  Custom Splint - Lower Extremity: Foot plate  Custom Splint - Other: Custom ankle foot orthosis that offloads the wound.  Wearing Schedule: During waking hours  Education: Patient  Notification of splint concerns: Instructed to contact clinic  Written Instructions Issued: Yes  Able to Todd and Doff Splint Correctly: Patient  eatment  Therapeutic Activities  Therapeutic Activities: 1  Therapeutic Activities 1: Emphasized importance of using a cane or walker for mobility to assist with offloading her right foot.  Splinting/Orthotics  Orthotic Fitting/Training: see splinting    Education  Primary Learner Name: Patient  Relationship to Primary Learner: Self  Does the primary learner express desire and motivation to learn?: Yes  Education provided: Body mechanics, Exercise guidelines, Explanation of objective findings provided, Mobility/joint precautions, Treatment plan/goals  Learning Preferences: Seeing, Hearing, Doing  Safety Education: Home safety precautions, Proper body mechanics, Weight bearing status, Pressure relief  Equipment Use Education:  Donning/doffing splint  Does the learner have any considerations that impact learning?: None  Learning/Educational Needs: Illness/disease, When/how to obtain future treatment, Treatment plan, Safety, Precautions  Education provided to: Patient  Modes: Explanation, Demonstration, Handouts/Printed material  Response: Verbalized understanding, Demonstrated skill  Home Exercise Program Issued: Importance of offloading, use of adaptive device for short distance mobility, wound healing    AM-PAC  Functional Outcome Measures               Assessment   Assessment Summary: Patient is a 63 who has a right chronic non-healing foot wound. Patient is currently using no assistive device for mobility. Custom molded a right offloading foot orthosis and education completed on offloading adherence and strategies with good understanding. Patient has history of previous wounds that adds to the complexity of her pathology. Patient will benefit from orthotic modification as needed, offloading education to improve adherence and promote healing.   Benefit Statement: Patient would benefit from education in a home exercise program to address deficits and promote increased functional performance., Patient would benefit from skilled occupational therapy to address deficits and promote increased functional performance.  Rehab Potential: Good  Response to Therapy: Good  Goal 1: Promote healing of right plantar wound with good follow through of use and care of orthosis, modify as needed in 3 visits.   Goal 2: Patient will demonstrate good follow through of offloading during functional mobility and activities of daily living, including use of an assistive walking device in 3 visits.    Outpatient Service Line  Specialty Service Line: Orthopedic  Orthopedics: Ankle/foot  Moderate complexity occupational therapy evaluation with expanded review of medical records, may have comorbidities and 3-5 performance deficits that results in activity  limitation, moderate analytic complexity, detailed assessment with minimal to moderate modification and consideration of several treatment options.     Plan/Recommendations  Treatment Frequency:    Treatment Duration: for       Other Treatment Duration Comment: 3 visits  Treatment Interventions: Orthotics fitting and training, Patient education, Splint fabrication, Therapeutic activity  Recommendations:    Other Comment: Patient will use a cane or FWW with the orthosis to effectively offload the wound and for safety with mobility  Equipment Recommendations: Equipment Recommendations: Rolling walker  Equipment Dispensed: Ankle-foot orthosis  Participated in Above Goal Setting and Agrees with Plan of Care: Patient   Communicated/Interdisciplinary Meeting: Patient      Freddrick Myrick [place], [place]    License Number: Freddrick [place], OTR/L, CHT  MD License # [id number]      Therapeutic Intervention Time   Evaluation 20 Minutes   Fabricated and Applied Short Leg Splint 60 Minutes                                 Total Intervention Time 80 Minutes       Physician Certification: This is to certify that the above named patient, who is under my care, requires skilled Occupational Therapy services as described in the above treatment plan. I further certify that the services outlined in this plan are skilled and medically necessary. I have reviewed this plan for rehabilitation services, and I recommend that these services continue from 09/14/24 through 12/13/24 to meet the goals stated above.      Physician's Signature: _________________________________________________    Date of Certification: ___________________________________

## 2024-11-03 ENCOUNTER — Emergency Department

## 2024-11-03 ENCOUNTER — Emergency Department
Admission: EM | Admit: 2024-11-03 | Discharge: 2024-11-03 | Disposition: A | Discharge status: Home / Self Care | Attending: Student in an Organized Health Care Education/Training Program | Admitting: Student in an Organized Health Care Education/Training Program

## 2024-11-03 DIAGNOSIS — Z79899 Other long term (current) drug therapy: Secondary | ICD-10-CM | POA: Insufficient documentation

## 2024-11-03 DIAGNOSIS — S42294A Other nondisplaced fracture of upper end of right humerus, initial encounter for closed fracture: Secondary | ICD-10-CM

## 2024-11-03 DIAGNOSIS — S42201A Unspecified fracture of upper end of right humerus, initial encounter for closed fracture: Secondary | ICD-10-CM | POA: Insufficient documentation

## 2024-11-03 LAB — LAB USE ONLY - CBC WITH DIFFERENTIAL
Absolute Basophils: 0.05 x10 3/uL (ref 0.00–0.08)
Absolute Eosinophils: 0.13 x10 3/uL (ref 0.00–0.44)
Absolute Immature Granulocytes: 0.01 x10 3/uL (ref 0.00–0.07)
Absolute Lymphocytes: 2.8 x10 3/uL (ref 0.42–3.22)
Absolute Monocytes: 0.74 x10 3/uL (ref 0.21–0.85)
Absolute Neutrophils: 2.72 x10 3/uL (ref 1.10–6.33)
Absolute nRBC: 0 x10 3/uL (ref <=0.00)
Basophils %: 0.8 %
Eosinophils %: 2 %
Hematocrit: 32.1 % — ABNORMAL LOW (ref 34.7–43.7)
Hemoglobin: 10.6 g/dL — ABNORMAL LOW (ref 11.4–14.8)
Immature Granulocytes %: 0.2 %
Lymphocytes %: 43.4 %
MCH: 29.6 pg (ref 25.1–33.5)
MCHC: 33 g/dL (ref 31.5–35.8)
MCV: 89.7 fL (ref 78.0–96.0)
MPV: 11 fL (ref 8.9–12.5)
Monocytes %: 11.5 %
Neutrophils %: 42.1 %
Platelet Count: 335 x10 3/uL (ref 142–346)
Preliminary Absolute Neutrophil Count: 2.72 x10 3/uL (ref 1.10–6.33)
RBC: 3.58 x10 6/uL — ABNORMAL LOW (ref 3.90–5.10)
RDW: 13 % (ref 11–15)
WBC: 6.45 x10 3/uL (ref 3.10–9.50)
nRBC %: 0 /100{WBCs} (ref <=0.0)

## 2024-11-03 LAB — COMPREHENSIVE METABOLIC PANEL
ALT: 19 U/L (ref <=55)
AST (SGOT): 31 U/L (ref <=41)
Albumin/Globulin Ratio: 1.1 (ref 0.9–2.2)
Albumin: 3.5 g/dL (ref 3.5–4.9)
Alkaline Phosphatase: 109 U/L (ref 37–117)
Anion Gap: 10 (ref 5.0–15.0)
BUN: 17 mg/dL (ref 7–21)
Bilirubin, Total: 0.3 mg/dL (ref 0.2–1.2)
CO2: 24 meq/L (ref 17–29)
Calcium: 9.4 mg/dL (ref 8.5–10.5)
Chloride: 102 meq/L (ref 99–111)
Creatinine: 1 mg/dL (ref 0.4–1.0)
GFR: >60 mL/min/1.73 m2 (ref >=60.0)
Globulin: 3.1 g/dL (ref 2.0–3.6)
Glucose: 160 mg/dL — ABNORMAL HIGH (ref 70–100)
Potassium: 4.6 meq/L (ref 3.5–5.3)
Protein, Total: 6.6 g/dL (ref 6.0–8.3)
Sodium: 136 meq/L (ref 135–145)

## 2024-11-03 LAB — ETHANOL (ALCOHOL) LEVEL: Alcohol: NOT DETECTED

## 2024-11-03 LAB — WHOLE BLOOD GLUCOSE POCT: Whole Blood Glucose POCT: 182 mg/dL — ABNORMAL HIGH (ref 70–100)

## 2024-11-03 MED ORDER — BACITRACIN +/- ZINC 500 UNIT/GM EX OINT (WRAP)
TOPICAL_OINTMENT | Freq: Once | CUTANEOUS | Status: AC
Start: 2024-11-03 — End: 2024-11-03
  Administered 2024-11-03: 1 g via TOPICAL
  Filled 2024-11-03: qty 1

## 2024-11-03 MED ORDER — DEXTROSE 50 % IV SOLN
50.0000 mL | Freq: Once | INTRAVENOUS | Status: AC
Start: 2024-11-03 — End: 2024-11-03
  Administered 2024-11-03: 50 mL via INTRAVENOUS
  Filled 2024-11-03: qty 50

## 2024-11-03 MED ORDER — OXYCODONE-ACETAMINOPHEN 5-325 MG PO TABS
1.0000 | ORAL_TABLET | ORAL | 0 refills | Status: DC | PRN
Start: 2024-11-03 — End: 2024-11-10

## 2024-11-03 MED ORDER — OXYCODONE-ACETAMINOPHEN 5-325 MG PO TABS
1.0000 | ORAL_TABLET | Freq: Once | ORAL | Status: AC
Start: 2024-11-03 — End: 2024-11-03
  Administered 2024-11-03: 1 via ORAL
  Filled 2024-11-03: qty 1

## 2024-11-03 MED ORDER — LORAZEPAM 2 MG/ML IJ SOLN
1.0000 mg | Freq: Once | INTRAMUSCULAR | Status: AC
Start: 2024-11-03 — End: 2024-11-03
  Administered 2024-11-03: 1 mg via INTRAVENOUS
  Filled 2024-11-03: qty 1

## 2024-11-03 MED ORDER — NALOXONE HCL 4 MG/0.1ML NA LIQD
NASAL | 0 refills | Status: AC
Start: 2024-11-03 — End: ?

## 2024-11-03 NOTE — ED Notes (Signed)
Bed: EX24  Expected date:   Expected time:   Means of arrival:   Comments:  Medic 207

## 2024-11-03 NOTE — ED Triage Notes (Signed)
 Patient brought in by EMS c/o right shoulder and elbow injury.  Patient reports she slipped on leaves getting out of her car and thinks her arm may have hit the hitch on a truck.  Patient is also diabetic and glucose reading is LOW.

## 2024-11-03 NOTE — ED Notes (Signed)
 Glucose reading Low on patient's monitor and refusing staff to use hospital glucose monitor.  Patient given 4oz orange juice.

## 2024-11-03 NOTE — ED Provider Notes (Addendum)
 [facility]  Emergency Department Physician Note      Diagnosis/Disposition     ED Disposition:  Discharge    ED Diagnosis:  Other closed nondisplaced fracture of proximal end of right humerus, initial encounter    History of Present Illness        HPI: [name] [name] is a 58 y.o. female with history of type 1 diabetes, neuropathy who presents today for right upper extremity pain.  Slipped while getting out of her car earlier today, reports that her right arm hit the hitch of a truck.  Denies head injury or other injuries.  States that she cannot raise her right arm due to pain.  Also reports that she felt hypoglycemia, was found to have a blood glucose in the 50s by EMS.  Received oral glucose by EMS.  No other complaints at this time.    Physical Exam     Pulse 87  BP 169/79  Resp 18  SpO2 95 %  Temp (!) 96.5 F (35.8 C)     Physical Exam  Constitutional:       General: She is not in acute distress.  HENT:      Head: Normocephalic and atraumatic.      Right Ear: External ear normal.      Left Ear: External ear normal.      Nose: Nose normal. No congestion.   Eyes:      Extraocular Movements: Extraocular movements intact.      Conjunctiva/sclera: Conjunctivae normal.   Cardiovascular:      Rate and Rhythm: Normal rate and regular rhythm.      Pulses: Normal pulses.   Pulmonary:      Effort: Pulmonary effort is normal.   Abdominal:      General: Abdomen is flat.   Musculoskeletal:         General: No deformity.      Cervical back: Normal range of motion and neck supple.      Comments: Right upper extremity swelling, tender.  Right elbow with shallow irregular skin tear less than 1 cm in length, hemostatic.  Neurovascular intact distally, strong distal pulse.   Skin:     General: Skin is warm and dry.   Neurological:      General: No focal deficit present.      Mental Status: She is alert and oriented to person, place, and time.   Psychiatric:         Mood and Affect: Mood normal.          Behavior: Behavior normal.        Medical Decision Making        Initial Differential Diagnosis:  Initial differential diagnosis to include but not limited to: Elbow fracture, shoulder fracture, hypoglycemia      MDM/ED Course:  58 y.o. female presents with right arm injury. Patient's care impacted by chronic condition(s) listed in HPI. Workup today consistent with proximal humerus fracture.  Neurovascular intact distally.  Hyperglycemic on arrival as well, given dextrose  with improvement.  Patient given pain control with some improvement.  I recommended that she be admitted to the hospital due to her degree of pain, however she would like to go home.  Recommend to follow-up with orthopedic surgery.  Placed in sling.  Considered admission, however based on reassuring work-up/symptomatology, patient is amenable to being discharged.  Appropriate return precautions were discussed.  Patient voices appreciation of our care.  Patient was discharged in stable condition.  Clinical Impression:   1. Other closed nondisplaced fracture of proximal end of right humerus, initial encounter        Disposition:   ED Disposition       ED Disposition   Discharge    Condition   Stable    Date/Time   Wed Nov 03, 2024  9:40 PM    Comment   [name] [name] discharge to home/self care.                   I reviewed patient's external medical record, last ED visit, clinic visit and/or admission/discharge summary, as well as associated recent EKGs, lab or imaging results, if applicable. Details documented in ED Course above.                                  Interpretations       Amount and Complexity of Data Reviewed:  Pulse Oximetry Analysis:  Interpreted by me: Normal  Cardiac Monitor: Interpreted by me: Rhythm:  Normal Sinus, Rate:  Normal    Labs: All labs have been ordered, reviewed and interpreted by me, if applicable. Details documented in MDM above.    Xray/CT/US /MRI: Ordered, if applicable, reviewed and interpreted by me, confirmed  by radiology report. Details documented in MDM above.              Procedures      Splint Application    Date/Time: 11/03/2024 8:40 PM    Performed by: Diona Humbles, MD  Authorized by: Diona Humbles, MD    Consent:     Consent obtained:  Verbal    Consent given by:  Patient  Universal protocol:     Imaging studies available: yes      Patient identity confirmed:  Verbally with patient and arm band  Pre-procedure details:     Distal neurologic exam:  Normal    Distal perfusion: distal pulses strong    Procedure details:     Location:  Arm    Arm location:  R upper arm    Supplies:  Sling  Post-procedure details:     Distal neurologic exam:  Normal    Distal perfusion: distal pulses strong      Procedure completion:  Tolerated well, no immediate complications        Supplemental Encounter Data   Medical History[1]  Past Surgical History[2]  Social History[3]  Family History[4]  Allergies[5]    Encounter Orders:  Orders Placed This Encounter   Procedures    Sling    Shoulder Right 2+ Views    XR Elbow Right AP And Lateral    CBC with Differential (Order)    Comprehensive Metabolic Panel    CBC with Differential (Component)    Ethanol (Alcohol ) Level    Referral to [facility] (INTERNAL)     Medications Administered:  Medications   LORazepam  (ATIVAN ) injection 1 mg (1 mg Intravenous Given 11/03/24 1903)   dextrose  50 % bolus 50 mL (50 mLs Intravenous Given 11/03/24 1901)   oxyCODONE -acetaminophen  (PERCOCET) 5-325 MG per tablet 1 tablet (1 tablet Oral Given 11/03/24 1928)   bacitracin  ointment (1 g Topical Given 11/03/24 2050)   oxyCODONE -acetaminophen  (PERCOCET) 5-325 MG per tablet 1 tablet (1 tablet Oral Given 11/03/24 2049)     Laboratory and Imaging Studies:  Results for orders placed or performed during the hospital encounter of 11/03/24 (from the past 24 hours)  CBC with Differential (Component)    Collection Time: 11/03/24  7:01 PM   Result Value    WBC 6.45    Hemoglobin 10.6 (L)     Hematocrit 32.1 (L)    Platelet Count 335    MPV 11.0    RBC 3.58 (L)    MCV 89.7    MCH 29.6    MCHC 33.0    RDW 13    nRBC % 0.0    Absolute nRBC 0.00    Preliminary Absolute Neutrophil Count 2.72    Neutrophils % 42.1    Lymphocytes % 43.4    Monocytes % 11.5    Eosinophils % 2.0    Basophils % 0.8    Immature Granulocytes % 0.2    Absolute Neutrophils 2.72    Absolute Lymphocytes 2.80    Absolute Monocytes 0.74    Absolute Eosinophils 0.13    Absolute Basophils 0.05    Absolute Immature Granulocytes 0.01   Ethanol (Alcohol ) Level    Collection Time: 11/03/24  7:21 PM   Result Value    Alcohol  Not Detected    Collection Time: 11/03/24  7:43 PM   Result Value    Whole Blood Glucose POCT 182 (H)   Comprehensive Metabolic Panel    Collection Time: 11/03/24  8:47 PM   Result Value    Glucose 160 (H)    BUN 17    Creatinine 1.0    Sodium 136    Potassium 4.6    Chloride 102    CO2 24    Calcium 9.4    Anion Gap 10.0    GFR >60.0    AST (SGOT) 31    ALT 19    Alkaline Phosphatase 109    Albumin 3.5    Protein, Total 6.6    Globulin 3.1    Albumin/Globulin Ratio 1.1    Bilirubin, Total 0.3     XR Elbow Right AP And Lateral   Final Result      No acute osseous abnormality.      William L. Jolee MD   11/03/2024 8:55 PM      Shoulder Right 2+ Views   Final Result      Comminuted and mildly displaced fracture of the proximal humerus as above.      William L. Jolee MD   11/03/2024 8:54 PM          Attestations     I am the primary attending physician of record for this patient.     This chart was generated in a busy and noisy emergency room using voice recognition software.  All charts are carefully checked and monitored for transcription errors, however due to the nature of this practice environment errors may occasionally occur.       Diona Humbles, MD  11/04/24 0146         [1]   Past Medical History:  Diagnosis Date    Abnormal vision     Anemia     Autonomic neuropathy     Chicken pox     Chronic back pain     Difficulty  walking     uses walking assist devices if needed, crutches, boot, w/c prn    Disorder of musculoskeletal system     Echocardiogram 11/2017    Gastroesophageal reflux disease     Gastroparesis     Hyperlipidemia     Hypothyroidism     Low back pain     sciatic  pain    Measles     MPI (Nuclear) Study 2018    MRSA (methicillin resistant staph aureus) culture positive 2017    right foot    Mumps     Osteomyelitis (CMS/HCC) 2018    Peripheral vascular disease     Renal insufficiency     AKI while hospitalized for osteomyelitis    Type 2 diabetes mellitus, controlled (CMS/HCC)     insulin  dependent   [2]   Past Surgical History:  Procedure Laterality Date    CAROTID ENDARTERECTOMY Right 2019    COLONOSCOPY, DIAGNOSTIC (SCREENING)      R+ foot 5th toe/metatarsal amputation  06/2016    SPINE SURGERY  2011, 11/2014, 11/2018    x3   [3]   Social History  Tobacco Use    Smoking status: Former     Current packs/day: 0.00     Types: Cigarettes     Start date: 1988     Quit date: 2018     Years since quitting: 7.8    Smokeless tobacco: Never   Vaping Use    Vaping status: Never Used   Substance Use Topics    Alcohol  use: Yes     Alcohol /week: 1.0 standard drink of alcohol      Types: 1 Glasses of wine per week     Comment: < once/month    Drug use: Yes     Types: Marijuana     Comment: daily for pain relief   [4]   Family History  Problem Relation Name Age of Onset    Dementia Mother      Diabetes Mother          Type 2    Heart disease Father      Aortic aneurysm Father          Unspecified aneurysm    Myocardial Infarction Paternal Grandmother      Diabetes Paternal Grandfather      Heart disease Maternal Grandparent          Unspecified heart issues    Anesthesia problems Neg Hx      Malignant hyperthermia Neg Hx     [5]   Allergies  Allergen Reactions    Sulfamethoxazole-Trimethoprim Thrombocytopenia     Other Reaction(s): Thrombocytopenia    Vancomycin  Nausea And Vomiting    Cranberry Hives     Cerner Allergy Text  Annotation: Cranberry; pt states she has been drinking cranberry juice this admission with no allergic reaction   Cerner Allergy Text Annotation: Cranberry; pt states she has been drinking cranberry juice this admission with no allergic reaction      Cerner Allergy Text Annotation: Cranberry; pt states she has been drinking cranberry juice this admission with no allergic reaction   Cerner Allergy Text Annotation: Cranberry; pt states she has been drinking cranberry juice this admission with no allergic reaction   Cerner Allergy Text Annotation: Cranberry; pt states she has been drinking cranberry juice this admission with no allergic reaction   Cerner Allergy Text Annotation: Cranberry; pt states she has been drinking cranberry juice this admission with no allergic reaction    Cerner Allergy Text Annotation: Cranberry; pt states she has been drinking cranberry juice this admission with no allergic reaction   Cerner Allergy Text Annotation: Cranberry; pt states she has been drinking cranberry juice this admission with no allergic reaction      Cerner Allergy Text Annotation: Cranberry; pt states she has been drinking cranberry juice this admission with no  allergic reaction   Cerner Allergy Text Annotation: Cranberry; pt states she has been drinking cranberry juice this admission with no allergic reaction   Cerner Allergy Text Annotation: Cranberry; pt states she has been drinking cranberry juice this admission with no allergic reaction   Cerner Allergy Text Annotation: Cranberry; pt states she has been drinking cranberry juice this admission with no allergic reaction   Cerner Allergy Text Annotation: Cranberry; pt states she has been drinking cranberry juice this admission with no allergic reaction   Cerner Allergy Text Annotation: Cranberry; pt states she has been drinking cranberry juice this admission with no allergic reaction   Cerner Allergy Text Annotation: Cranberry; pt states she has been drinking cranberry  juice this admission with no allergic reaction   Cerner Allergy Text Annotation: Cranberry; pt states she has been drinking cranberry juice this admission with no allergic reaction   Cerner Allergy Text Annotation: Cranberry; pt states she has been drinking cranberry juice this admission with no allergic reaction    Cerner Allergy Text Annotation: Cranberry; pt states she has been drinking cranberry juice this admission with no allergic reaction    Other      Actifed - hives and insomnia    ACUPHED   Actifed - hives and insomnia    Triprolidine-Pse Hives     Other reaction(s): Hives   Actifed   Actifed    Triprolidine-Pseudoephedrine Hives and Other (See Comments)     Actifed   Actifed   Other reaction(s): Hives   Actifed   Actifed   Actifed    Other reaction(s): Hives, Actifed    Other reaction(s): Hives   Actifed    Actifed. Insomnia    Aminoglycosides Nausea And Vomiting     Other Reaction(s): GI Intolerance    Mycin Drugs    Cranberry Extract Hives    Pseudoephedrine     Morphine  Itching    Morphine  And Codeine Hives and Itching        Diona Humbles, MD  12/01/24 [id number]

## 2024-11-03 NOTE — ED Notes (Signed)
 Bed: GR5  Expected date:   Expected time:   Means of arrival:   Comments:  Needs brain

## 2024-11-04 ENCOUNTER — Emergency Department: Admission: EM | Admit: 2024-11-04 | Discharge: 2024-11-04 | Disposition: A | Discharge status: Home / Self Care

## 2024-11-04 DIAGNOSIS — S42294A Other nondisplaced fracture of upper end of right humerus, initial encounter for closed fracture: Secondary | ICD-10-CM | POA: Insufficient documentation

## 2024-11-04 DIAGNOSIS — W228XXA Striking against or struck by other objects, initial encounter: Secondary | ICD-10-CM | POA: Insufficient documentation

## 2024-11-04 DIAGNOSIS — S42201S Unspecified fracture of upper end of right humerus, sequela: Secondary | ICD-10-CM

## 2024-11-04 MED ORDER — MORPHINE SULFATE 4 MG/ML IJ/IV SOLN (WRAP)
4.0000 mg | Freq: Once | Status: AC
Start: 2024-11-04 — End: 2024-11-04
  Administered 2024-11-04: 4 mg via INTRAVENOUS
  Filled 2024-11-04: qty 1

## 2024-11-04 MED ORDER — OXYCODONE-ACETAMINOPHEN 5-325 MG PO TABS
2.0000 | ORAL_TABLET | ORAL | 0 refills | Status: AC | PRN
Start: 2024-11-04 — End: 2024-11-11

## 2024-11-04 MED ORDER — KETOROLAC TROMETHAMINE 15 MG/ML IJ SOLN
15.0000 mg | Freq: Once | INTRAMUSCULAR | Status: AC
Start: 2024-11-04 — End: 2024-11-04
  Administered 2024-11-04: 15 mg via INTRAVENOUS
  Filled 2024-11-04: qty 1

## 2024-11-04 MED ORDER — ACETAMINOPHEN 500 MG PO TABS
500.0000 mg | ORAL_TABLET | Freq: Once | ORAL | Status: AC
Start: 2024-11-04 — End: 2024-11-04
  Administered 2024-11-04: 500 mg via ORAL
  Filled 2024-11-04: qty 1

## 2024-11-04 NOTE — ED Provider Notes (Incomplete)
 [facility]  Emergency Department Physician Note      Diagnosis/Disposition     ED Disposition:  Data Unavailable    ED Diagnosis:  Data Unavailable    History of Present Illness   {Disappearing Text  Use the Action Links below to navigate to specific areas of the chart and the History Hover Bubbles to view history.   Links  Snapshot    Decision Support    Care Everywhere  History  Medical History[1] Past Surgical History[2] Social History[3] Family History[4] Allergies[5] Medications Ordered Prior to Encounter[6]               :[id number]}     HPI: [name] [name] is a 58 y.o. female with *** No other complaints at this time.    Physical Exam     Pulse 87  BP 169/79  Resp 18  SpO2 95 %  Temp (!) 96.5 F (35.8 C)     Physical Exam  Constitutional:       General: She is not in acute distress.  HENT:      Head: Normocephalic and atraumatic.      Right Ear: External ear normal.      Left Ear: External ear normal.      Nose: Nose normal. No congestion.   Eyes:      Extraocular Movements: Extraocular movements intact.      Conjunctiva/sclera: Conjunctivae normal.   Cardiovascular:      Rate and Rhythm: Normal rate and regular rhythm.      Pulses: Normal pulses.   Pulmonary:      Effort: Pulmonary effort is normal.   Abdominal:      General: Abdomen is flat.   Musculoskeletal:         General: No deformity.      Cervical back: Normal range of motion and neck supple.      Comments: Right upper extremity swelling, tender.  Right elbow with shallow irregular skin tear less than 1 cm in length, hemostatic.  Neurovascular intact distally, strong distal pulse.   Skin:     General: Skin is warm and dry.   Neurological:      General: No focal deficit present.      Mental Status: She is alert and oriented to person, place, and time.   Psychiatric:         Mood and Affect: Mood normal.         Behavior: Behavior normal.        Medical Decision Making   {Disappearing Text  Use the Action and Data Review Links  below to navigate to specific areas of the chart.  Action Links  Snapshot    Orders    Decision Support    Secure Chat    Transfer Documents    Patient Care Timeline    Workup    Dispo  Data Review Links Results Review    EKG    Labs    Imaging    Media              :[id number]}     Initial Differential Diagnosis:  Initial differential diagnosis to include but not limited to: {IIK:38527}      MDM/ED Course:  58 y.o. female presents with ***. Patient's care impacted by chronic condition(s) listed in HPI. Workup today consistent with ***.    Clinical Impression: No diagnosis found.    Disposition:   ED Disposition       None  I reviewed patient's external medical record, last ED visit, clinic visit and/or admission/discharge summary, as well as associated recent EKGs, lab or imaging results, if applicable. Details documented in ED Course above.    Clinical information obtained from an independent historian. History obtained from or confirmed by ***, who provided the information as outlined in the HPI.    {Was management discussed with a consultant? (Optional):61519::N/A}  {Diagnostic test considered and not performed (Optional):61521::N/A}  {Prescription medications considered and not given (Optional):61522::N/A}  {Hospitalization considered but not done (Optional):61523}  {Was the decision around the need for surgery discussed with a consultant? (Optional):61527::N/A}  {Social Determinants of Health Considerations (Optional):61524::N/A}  {Was there decision to not resuscitate or to de-escalate care due to poor prognosis? (Optional):61525::N/A}  Case was discussed with ***, who accepted patient for admission and requested {Obs vs Inpatient:53791} {Dispo Unit:53792} bed.     The following USACS CMT is used for risk management:   {[facility] ED JOO:695940672}           Interpretations       Amount and Complexity of Data Reviewed:  Pulse Oximetry Analysis:  Interpreted by me: {PulseOx  charting:47805}  Cardiac Monitor: Interpreted by me: Rhythm:  {Rhythm:16023332}, Rate:  {Rate:16023334}    Labs: All labs have been ordered, reviewed and interpreted by me, if applicable. Details documented in MDM above.    Xray/CT/US /MRI: Ordered, if applicable, reviewed and interpreted by me, confirmed by radiology report. Details documented in MDM above.    EKG:  Interpreted by this Emergency Physician.   Time Interpreted: ***   Rate: ***   Rhythm: {Rhythm:16023332}   Interpretation: QRS ***, QTc ***, no ST elevations or depressions   Comparison: {EKG COMPARISON :29265}      {System Sepsis Documentation:62871:::1}          Procedures   {Please remember to document critical care time in Procedure SmartBlock if indicated:62366}   Procedures      Supplemental Encounter Data   Medical History[7]  Past Surgical History[8]  Social History[9]  Family History[10]  Allergies[11]    Encounter Orders:  Orders Placed This Encounter   Procedures   . Shoulder Right 2+ Views   . Elbow Right AP Lateral and Obliques   . CBC with Differential (Order)   . Comprehensive Metabolic Panel   . CBC with Differential (Component)     Medications Administered:  Medications   LORazepam (ATIVAN) injection 1 mg (1 mg Intravenous Given 11/03/24 1903)   dextrose 50 % bolus 50 mL (50 mLs Intravenous Given 11/03/24 1901)     Laboratory and Imaging Studies:  Results for orders placed or performed during the hospital encounter of 11/03/24 (from the past 24 hours)   CBC with Differential (Component)    Collection Time: 11/03/24  7:01 PM   Result Value    WBC 6.45    Hemoglobin 10.6 (L)    Hematocrit 32.1 (L)    Platelet Count 335    MPV 11.0    RBC 3.58 (L)    MCV 89.7    MCH 29.6    MCHC 33.0    RDW 13    nRBC % 0.0    Absolute nRBC 0.00    Preliminary Absolute Neutrophil Count 2.72    Neutrophils % 42.1    Lymphocytes % 43.4    Monocytes % 11.5    Eosinophils % 2.0    Basophils % 0.8    Immature Granulocytes % 0.2    Absolute Neutrophils 2.72  Absolute Lymphocytes 2.80    Absolute Monocytes 0.74    Absolute Eosinophils 0.13    Absolute Basophils 0.05    Absolute Immature Granulocytes 0.01     Shoulder Right 2+ Views    (Results Pending)   Elbow Right AP Lateral and Obliques    (Results Pending)       Attestations     I am the primary attending physician of record for this patient.     This chart was generated in a busy and noisy emergency room using voice recognition software.  All charts are carefully checked and monitored for transcription errors, however due to the nature of this practice environment errors may occasionally occur.             [1]  Past Medical History:  Diagnosis Date   . Abnormal vision    . Anemia    . Autonomic neuropathy    . Chicken pox    . Chronic back pain    . Difficulty walking     uses walking assist devices if needed, crutches, boot, w/c prn   . Disorder of musculoskeletal system    . Echocardiogram 11/2017   . Gastroesophageal reflux disease    . Gastroparesis    . Hyperlipidemia    . Hypothyroidism    . Low back pain     sciatic pain   . Measles    . MPI (Nuclear) Study 2018   . MRSA (methicillin resistant staph aureus) culture positive 2017    right foot   . Mumps    . Osteomyelitis (CMS/HCC) 2018   . Peripheral vascular disease    . Renal insufficiency     AKI while hospitalized for osteomyelitis   . Type 2 diabetes mellitus, controlled (CMS/HCC)     insulin  dependent   [2]  Past Surgical History:  Procedure Laterality Date   . CAROTID ENDARTERECTOMY Right 2019   . COLONOSCOPY, DIAGNOSTIC (SCREENING)     . R+ foot 5th toe/metatarsal amputation  06/2016   . SPINE SURGERY  2011, 11/2014, 11/2018    x3   [3]  Social History  Tobacco Use   . Smoking status: Former     Current packs/day: 0.00     Types: Cigarettes     Start date: 63     Quit date: 2018     Years since quitting: 7.8   . Smokeless tobacco: Never   Vaping Use   . Vaping status: Never Used   Substance Use Topics   . Alcohol use: Yes     Alcohol/week: 1.0  standard drink of alcohol     Types: 1 Glasses of wine per week     Comment: < once/month   . Drug use: Yes     Types: Marijuana     Comment: daily for pain relief   [4]  Family History  Problem Relation Name Age of Onset   . Dementia Mother     . Diabetes Mother          Type 2   . Heart disease Father     . Aortic aneurysm Father          Unspecified aneurysm   . Myocardial Infarction Paternal Grandmother     . Diabetes Paternal Grandfather     . Heart disease Maternal Grandparent          Unspecified heart issues   . Anesthesia problems Neg Hx     . Malignant hyperthermia Neg Hx     [  5]  Allergies  Allergen Reactions   . Sulfamethoxazole-Trimethoprim Thrombocytopenia     Other Reaction(s): Thrombocytopenia   . Vancomycin  Nausea And Vomiting   . Cranberry Hives     Cerner Allergy Text Annotation: Cranberry; pt states she has been drinking cranberry juice this admission with no allergic reaction   Cerner Allergy Text Annotation: Cranberry; pt states she has been drinking cranberry juice this admission with no allergic reaction      Cerner Allergy Text Annotation: Cranberry; pt states she has been drinking cranberry juice this admission with no allergic reaction   Cerner Allergy Text Annotation: Cranberry; pt states she has been drinking cranberry juice this admission with no allergic reaction   Cerner Allergy Text Annotation: Cranberry; pt states she has been drinking cranberry juice this admission with no allergic reaction   Cerner Allergy Text Annotation: Cranberry; pt states she has been drinking cranberry juice this admission with no allergic reaction    Cerner Allergy Text Annotation: Cranberry; pt states she has been drinking cranberry juice this admission with no allergic reaction   Cerner Allergy Text Annotation: Cranberry; pt states she has been drinking cranberry juice this admission with no allergic reaction      Cerner Allergy Text Annotation: Cranberry; pt states she has been drinking cranberry juice  this admission with no allergic reaction   Cerner Allergy Text Annotation: Cranberry; pt states she has been drinking cranberry juice this admission with no allergic reaction   Cerner Allergy Text Annotation: Cranberry; pt states she has been drinking cranberry juice this admission with no allergic reaction   Cerner Allergy Text Annotation: Cranberry; pt states she has been drinking cranberry juice this admission with no allergic reaction   Cerner Allergy Text Annotation: Cranberry; pt states she has been drinking cranberry juice this admission with no allergic reaction   Cerner Allergy Text Annotation: Cranberry; pt states she has been drinking cranberry juice this admission with no allergic reaction   Cerner Allergy Text Annotation: Cranberry; pt states she has been drinking cranberry juice this admission with no allergic reaction   Cerner Allergy Text Annotation: Cranberry; pt states she has been drinking cranberry juice this admission with no allergic reaction   Cerner Allergy Text Annotation: Cranberry; pt states she has been drinking cranberry juice this admission with no allergic reaction    Cerner Allergy Text Annotation: Cranberry; pt states she has been drinking cranberry juice this admission with no allergic reaction   . Other      Actifed - hives and insomnia    ACUPHED   Actifed - hives and insomnia   . Triprolidine-Pse Hives     Other reaction(s): Hives   Actifed   Actifed   . Triprolidine-Pseudoephedrine Hives and Other (See Comments)     Actifed   Actifed   Other reaction(s): Hives   Actifed   Actifed   Actifed    Other reaction(s): Hives, Actifed    Other reaction(s): Hives   Actifed    Actifed. Insomnia   . Aminoglycosides Nausea And Vomiting     Other Reaction(s): GI Intolerance    Mycin Drugs   . Cranberry Extract Hives   . Pseudoephedrine    . Morphine Itching   . Morphine And Codeine Hives and Itching   [6]  No current facility-administered medications on file prior to  encounter.     Current Outpatient Medications on File Prior to Encounter   Medication Sig Dispense Refill   . aspirin 81 MG chewable tablet Chew  1 tablet (81 mg) by mouth     . atorvastatin (LIPITOR) 40 MG tablet Take 1 tablet (40 mg) by mouth daily     . b complex vitamins tablet Take 1 tablet by mouth daily     . Cholecalciferol (VITAMIN D3) 2000 UNIT capsule Take 1 capsule (2,000 Units) by mouth daily     . HUMALOG 100 UNIT/ML injection Inject 10,000 Units into the skin as needed     . Lancets (FREESTYLE) lancets 1 each by Other route as needed     . midodrine  (PROAMATINE ) 10 MG tablet Take 1 tablet (10 mg) by mouth 3 (three) times daily 270 tablet 3   . Omega-3 Fatty Acids (FISH OIL) 1000 MG Cap capsule Take 1 capsule (1,000 mg) by mouth 2 (two) times daily     . omeprazole (PriLOSEC) 40 MG capsule Take 1 capsule (40 mg) by mouth daily     . vitamin E 1000 UNIT capsule Take 1 capsule (1,000 Units) by mouth daily     [7]  Past Medical History:  Diagnosis Date   . Abnormal vision    . Anemia    . Autonomic neuropathy    . Chicken pox    . Chronic back pain    . Difficulty walking     uses walking assist devices if needed, crutches, boot, w/c prn   . Disorder of musculoskeletal system    . Echocardiogram 11/2017   . Gastroesophageal reflux disease    . Gastroparesis    . Hyperlipidemia    . Hypothyroidism    . Low back pain     sciatic pain   . Measles    . MPI (Nuclear) Study 2018   . MRSA (methicillin resistant staph aureus) culture positive 2017    right foot   . Mumps    . Osteomyelitis (CMS/HCC) 2018   . Peripheral vascular disease    . Renal insufficiency     AKI while hospitalized for osteomyelitis   . Type 2 diabetes mellitus, controlled (CMS/HCC)     insulin  dependent   [8]  Past Surgical History:  Procedure Laterality Date   . CAROTID ENDARTERECTOMY Right 2019   . COLONOSCOPY, DIAGNOSTIC (SCREENING)     . R+ foot 5th toe/metatarsal amputation  06/2016   . SPINE SURGERY  2011, 11/2014, 11/2018    x3    [9]  Social History  Tobacco Use   . Smoking status: Former     Current packs/day: 0.00     Types: Cigarettes     Start date: 41     Quit date: 2018     Years since quitting: 7.8   . Smokeless tobacco: Never   Vaping Use   . Vaping status: Never Used   Substance Use Topics   . Alcohol use: Yes     Alcohol/week: 1.0 standard drink of alcohol     Types: 1 Glasses of wine per week     Comment: < once/month   . Drug use: Yes     Types: Marijuana     Comment: daily for pain relief   [10]  Family History  Problem Relation Name Age of Onset   . Dementia Mother     . Diabetes Mother          Type 2   . Heart disease Father     . Aortic aneurysm Father          Unspecified aneurysm   . Myocardial Infarction Paternal  Grandmother     . Diabetes Paternal Grandfather     . Heart disease Maternal Grandparent          Unspecified heart issues   . Anesthesia problems Neg Hx     . Malignant hyperthermia Neg Hx     [11]  Allergies  Allergen Reactions   . Sulfamethoxazole-Trimethoprim Thrombocytopenia     Other Reaction(s): Thrombocytopenia   . Vancomycin  Nausea And Vomiting   . Cranberry Hives     Cerner Allergy Text Annotation: Cranberry; pt states she has been drinking cranberry juice this admission with no allergic reaction   Cerner Allergy Text Annotation: Cranberry; pt states she has been drinking cranberry juice this admission with no allergic reaction      Cerner Allergy Text Annotation: Cranberry; pt states she has been drinking cranberry juice this admission with no allergic reaction   Cerner Allergy Text Annotation: Cranberry; pt states she has been drinking cranberry juice this admission with no allergic reaction   Cerner Allergy Text Annotation: Cranberry; pt states she has been drinking cranberry juice this admission with no allergic reaction   Cerner Allergy Text Annotation: Cranberry; pt states she has been drinking cranberry juice this admission with no allergic reaction    Cerner Allergy Text Annotation:  Cranberry; pt states she has been drinking cranberry juice this admission with no allergic reaction   Cerner Allergy Text Annotation: Cranberry; pt states she has been drinking cranberry juice this admission with no allergic reaction      Cerner Allergy Text Annotation: Cranberry; pt states she has been drinking cranberry juice this admission with no allergic reaction   Cerner Allergy Text Annotation: Cranberry; pt states she has been drinking cranberry juice this admission with no allergic reaction   Cerner Allergy Text Annotation: Cranberry; pt states she has been drinking cranberry juice this admission with no allergic reaction   Cerner Allergy Text Annotation: Cranberry; pt states she has been drinking cranberry juice this admission with no allergic reaction   Cerner Allergy Text Annotation: Cranberry; pt states she has been drinking cranberry juice this admission with no allergic reaction   Cerner Allergy Text Annotation: Cranberry; pt states she has been drinking cranberry juice this admission with no allergic reaction   Cerner Allergy Text Annotation: Cranberry; pt states she has been drinking cranberry juice this admission with no allergic reaction   Cerner Allergy Text Annotation: Cranberry; pt states she has been drinking cranberry juice this admission with no allergic reaction   Cerner Allergy Text Annotation: Cranberry; pt states she has been drinking cranberry juice this admission with no allergic reaction    Cerner Allergy Text Annotation: Cranberry; pt states she has been drinking cranberry juice this admission with no allergic reaction   . Other      Actifed - hives and insomnia    ACUPHED   Actifed - hives and insomnia   . Triprolidine-Pse Hives     Other reaction(s): Hives   Actifed   Actifed   . Triprolidine-Pseudoephedrine Hives and Other (See Comments)     Actifed   Actifed   Other reaction(s): Hives   Actifed   Actifed   Actifed    Other reaction(s): Hives, Actifed    Other  reaction(s): Hives   Actifed    Actifed. Insomnia   . Aminoglycosides Nausea And Vomiting     Other Reaction(s): GI Intolerance    Mycin Drugs   . Cranberry Extract Hives   . Pseudoephedrine    . Morphine  Itching   . Morphine And Codeine Hives and Itching

## 2024-11-04 NOTE — Discharge Instructions (Signed)
 Take 500 mg of naproxen OR 400 mg of ibuprofen every 6 hours  You can also take this with 500-650 mg of tylenol every 6 hours    For severe pain, take percocet (acetaminophen-oxycodone), 1-2 tablets every 6 hours, PLUS ibuprofen or naproxen. Do not take tylenol at the same time as percocet as percocet already contains tylenol.

## 2024-11-04 NOTE — ED Triage Notes (Signed)
 [facility] EMERGENCY DEPARTMENT  Provider in Triage Note        Patient Name: [name] [name]    Chief Complaint:   Chief Complaint   Patient presents with    Arm Pain       HPI: [name] [name] is a 58 y.o. female, who has had a rapid medical screening evaluation initiated by myself.     Complaining of continued pain to right arm s/p prox humerus fx yesterday.  Patient was going to be admitted for pain control but decided to go home last night.  Patient returns today complaining of pain    Medical/Surgical/Social history: as per HPI    Vitals: BP 122/83   Pulse 87   Temp 97.6 F (36.4 C)   Resp 18   SpO2 100%     Pertinent brief exam:   Examination of area of concern:           Preliminary orders: No orders of the defined types were placed in this encounter.   Medications - No data to display     Symptom based preliminary diagnosis/MDM: pain    Patient advised to remain in the ED until further evaluation can be performed. Patient instructed to notify staff of any changes in condition while waiting.  This assessment is an initial evaluation to expedite care.

## 2024-11-04 NOTE — ED Provider Notes (Signed)
 [facility]  Emergency Department Physician Note      Diagnosis/Disposition     ED Disposition:  Discharge    ED Diagnosis:     Closed fracture of proximal end of right humerus, sequela  Other closed nondisplaced fracture of proximal end of right humerus, initial encounter    History of Present Illness      Chief Complaint: Arm Pain    58 year old female, past medical history of type 1 diabetes, presents today for right arm pain    Patient was seen last night for right upper extremity pain after hitting her arm against the hitch of a truck.  Also had hypoglycemia that improved with with intervention.  At that time she was diagnosed with right proximal humerus fracture.  She was discharged with outpatient orthopedic follow-up.  Has an appointment tomorrow, but still having severe pain.  Despite taking home Percocet    I reviewed patient's last ED visit, clinic visit or admission/discharge summary, as well as associated recent EKGs, lab or imaging results, if applicable.     I reviewed x-ray from yesterday which indicates comminuted and mildly displaced fracture to the proximal humerus.    Physical Exam     Pulse 87  BP 122/83  Resp 18  SpO2 100 %  Temp 97.6 F (36.4 C)     Constitutional: Well appearing, no acute distress.  Skin: No rash or erythema.  Head: Normocephalic, atraumatic  Eyes: PERRL, normal conjunctiva, EOMI.  ENMT: Oral mucosa moist.     Cardiovascular: Normal peripheral perfusion.  Respiratory: No increased WOB.  Gastrointestinal: Soft, nontender, nondistended.  Musculoskeletal: Right arm pain to the shoulder.  Able to wiggle right fingers.  Neurologic: AOx3, able to wiggle right hand fingers, intact sensation  Psychiatric: Cooperative, appropriate mood and affect.      Medical Decision Making          I reviewed the vital signs, available nursing notes, allergies, past medical history, past surgical history, family history and social history.   If pertinent, they are mentioned in  HPI or MDM below.   I reviewed and interpreted all lab results, imaging, and vitals signs as noted in MDM course below.     Here in ED, vitals are reassuring.  Patient is tearful due to pain    Cardiac monitor reviewed by myself indicates: Normal sinus rhythm, no ectopy  Pulse oximeter reviewed by myself indicates: Normal range on room air  Blood pressure reviewed by myself indicates: Normotensive  Patient temperature afebrile    ED course      58 year old female, past medical history of type 1 diabetes, presents today for right arm pain    Differential at this time includes but is not limited to persistent pain due to underlying fracture, at this time, she is neurovascularly intact, no concern for compartment syndrome, nerve injury.  Will place in coaptation splint to ensure more stability, will give IV pain medication and then reassess.    ED Course as of 11/04/24 1735   Thu Nov 04, 2024   1728 Patient feeling improved after splint and pain medication, discharged with Ortho follow-up tomorrow. [KL]      ED Course User Index  [KL] Clint Neptune, MD                         [id number] - MAR ACTION REPORT  (last 24 hrs)           RYAN, NATALIE J,  RN         Medication Name Action Time Action Site Route Rate Dose Reason Comments User     acetaminophen (TYLENOL) tablet 500 mg 11/04/24 1631 Given  Oral  500 mg   Ryan, Natalie J, RN     ketorolac  (TORADOL ) injection 15 mg 11/04/24 1632 Given  Intravenous  15 mg   Ryan, Natalie J, RN     morphine injection 4 mg 11/04/24 1633 Given  Intravenous  4 mg   Ryan, Natalie J, RN                    The above diagnostic process was due to medical necessity based on risk stratification of potential harm of patient's presenting complaint.     Patient's care significantly impacted by medical history of: Right humerus fracture.      The patient and/or family is/are aware that today's emergency department evaluation has limitations and is only a screening that can be falsely reassuring.  We  discussed the need for follow up and strict return precautions. Patient and/or family demonstrate verbal understanding that they can return to the emergency department at any given time if they are having worsening symptoms, other complaints or difficulty with follow up      Current Discharge Medication List             Procedures      Orthopedic Injury    Date/Time: 11/04/2024 5:34 PM    Performed by: Clint Neptune, MD  Authorized by: Clint Neptune, MD  Consent: Verbal consent obtained  Risks and benefits: risks, benefits and alternatives were discussed  Imaging studies: imaging studies available  Patient identity confirmed: verbally with patient  Injury location: shoulder  Injury type: fracture  Fracture type: greater humeral tuberosity  Pre-procedure neurovascular assessment: neurovascularly intact  Pre-procedure range of motion: reduced    Anesthesia:  Local anesthesia used: no    Sedation:  Patient sedated: no    Manipulation performed: no  Immobilization: splint  Splint type: coaptation splint and sling.  Supplies used: Ortho-Glass  Post-procedure neurovascular assessment: post-procedure neurovascularly intact  Post-procedure distal perfusion: normal  Post-procedure neurological function: normal  Post-procedure range of motion: unchanged  Patient tolerance: patient tolerated the procedure well with no immediate complications            Supplemental Encounter Data   Medical History[7]  Past Surgical History[8]  Social History[9]  Family History[10]  Allergies[11]    Encounter Orders:  Orders Placed This Encounter   Procedures    Upper extremity products    ORTHOPEDIC INJURY TREATMENT    Saline lock IV     Medications Administered:  Medications   morphine injection 4 mg (4 mg Intravenous Given 11/04/24 1633)   ketorolac  (TORADOL ) injection 15 mg (15 mg Intravenous Given 11/04/24 1632)   acetaminophen (TYLENOL) tablet 500 mg (500 mg Oral Given 11/04/24 1631)     Laboratory and Imaging Studies:     No orders to  display       Attestations         CHART OWNERSHIP:     This note is prepared by Neptune Clint, MD. I am the first/primary provider for this patient.     This note was generated by the Epic EMR system/ Dragon speech recognition and may contain inherent errors or omissions not intended by the user. Grammatical errors, random word insertions, deletions and pronoun errors  are occasional consequences of this technology due to software limitations.  Not all errors are caught or corrected. If there are questions or concerns about the content of this note or information contained within the body of this dictation they should be addressed directly with the author for clarification.    Electronically signed by Josette Mayers, MD.                  [7]   Past Medical History:  Diagnosis Date    Abnormal vision     Anemia     Autonomic neuropathy     Chicken pox     Chronic back pain     Difficulty walking     uses walking assist devices if needed, crutches, boot, w/c prn    Disorder of musculoskeletal system     Echocardiogram 11/2017    Gastroesophageal reflux disease     Gastroparesis     Hyperlipidemia     Hypothyroidism     Low back pain     sciatic pain    Measles     MPI (Nuclear) Study 2018    MRSA (methicillin resistant staph aureus) culture positive 2017    right foot    Mumps     Osteomyelitis (CMS/HCC) 2018    Peripheral vascular disease     Renal insufficiency     AKI while hospitalized for osteomyelitis    Type 2 diabetes mellitus, controlled (CMS/HCC)     insulin  dependent   [8]   Past Surgical History:  Procedure Laterality Date    CAROTID ENDARTERECTOMY Right 2019    COLONOSCOPY, DIAGNOSTIC (SCREENING)      R+ foot 5th toe/metatarsal amputation  06/2016    SPINE SURGERY  2011, 11/2014, 11/2018    x3   [9]   Social History  Tobacco Use    Smoking status: Former     Current packs/day: 0.00     Types: Cigarettes     Start date: 1988     Quit date: 2018     Years since quitting: 7.8    Smokeless tobacco: Never   Vaping  Use    Vaping status: Never Used   Substance Use Topics    Alcohol use: Yes     Alcohol/week: 1.0 standard drink of alcohol     Types: 1 Glasses of wine per week     Comment: < once/month    Drug use: Yes     Types: Marijuana     Comment: daily for pain relief   [10]   Family History  Problem Relation Name Age of Onset    Dementia Mother      Diabetes Mother          Type 2    Heart disease Father      Aortic aneurysm Father          Unspecified aneurysm    Myocardial Infarction Paternal Grandmother      Diabetes Paternal Grandfather      Heart disease Maternal Grandparent          Unspecified heart issues    Anesthesia problems Neg Hx      Malignant hyperthermia Neg Hx     [11]   Allergies  Allergen Reactions    Sulfamethoxazole-Trimethoprim Thrombocytopenia     Other Reaction(s): Thrombocytopenia    Vancomycin  Nausea And Vomiting    Cranberry Hives     Cerner Allergy Text Annotation: Cranberry; pt states she has been drinking cranberry juice this admission with no allergic reaction   Cerner Allergy Text  Annotation: Cranberry; pt states she has been drinking cranberry juice this admission with no allergic reaction      Cerner Allergy Text Annotation: Cranberry; pt states she has been drinking cranberry juice this admission with no allergic reaction   Cerner Allergy Text Annotation: Cranberry; pt states she has been drinking cranberry juice this admission with no allergic reaction   Cerner Allergy Text Annotation: Cranberry; pt states she has been drinking cranberry juice this admission with no allergic reaction   Cerner Allergy Text Annotation: Cranberry; pt states she has been drinking cranberry juice this admission with no allergic reaction    Cerner Allergy Text Annotation: Cranberry; pt states she has been drinking cranberry juice this admission with no allergic reaction   Cerner Allergy Text Annotation: Cranberry; pt states she has been drinking cranberry juice this admission with no allergic reaction       Cerner Allergy Text Annotation: Cranberry; pt states she has been drinking cranberry juice this admission with no allergic reaction   Cerner Allergy Text Annotation: Cranberry; pt states she has been drinking cranberry juice this admission with no allergic reaction   Cerner Allergy Text Annotation: Cranberry; pt states she has been drinking cranberry juice this admission with no allergic reaction   Cerner Allergy Text Annotation: Cranberry; pt states she has been drinking cranberry juice this admission with no allergic reaction   Cerner Allergy Text Annotation: Cranberry; pt states she has been drinking cranberry juice this admission with no allergic reaction   Cerner Allergy Text Annotation: Cranberry; pt states she has been drinking cranberry juice this admission with no allergic reaction   Cerner Allergy Text Annotation: Cranberry; pt states she has been drinking cranberry juice this admission with no allergic reaction   Cerner Allergy Text Annotation: Cranberry; pt states she has been drinking cranberry juice this admission with no allergic reaction   Cerner Allergy Text Annotation: Cranberry; pt states she has been drinking cranberry juice this admission with no allergic reaction    Cerner Allergy Text Annotation: Cranberry; pt states she has been drinking cranberry juice this admission with no allergic reaction    Other      Actifed - hives and insomnia    ACUPHED   Actifed - hives and insomnia    Triprolidine-Pse Hives     Other reaction(s): Hives   Actifed   Actifed    Triprolidine-Pseudoephedrine Hives and Other (See Comments)     Actifed   Actifed   Other reaction(s): Hives   Actifed   Actifed   Actifed    Other reaction(s): Hives, Actifed    Other reaction(s): Hives   Actifed    Actifed. Insomnia    Aminoglycosides Nausea And Vomiting     Other Reaction(s): GI Intolerance    Mycin Drugs    Cranberry Extract Hives    Pseudoephedrine     Morphine Itching    Morphine And Codeine Hives and  Itching        Clint Neptune, MD  11/04/24 1735

## 2024-11-04 NOTE — ED Triage Notes (Signed)
 [name] [name] is a 58 y.o. female presenting today with c/o R upper arm pain x 1 day. Reports fell yesterday and sustained a fracture to the proximal humerus. States was recommended admission, but was unable to stay yesterday. Reports pain has significantly increased. Taking percocet, last dose around 10am.     BP 122/83   Pulse 87   Temp 97.6 F (36.4 C)   Resp 18   SpO2 100%

## 2024-11-09 NOTE — Progress Notes (Addendum)
 [name] [name] is a 58 y.o. female who presents for post-operative evaluation. 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  Subsequent gangrene and now regeneration of the anterior distal aspect of the right foot and healed.  Followup issue plantar lateral recurrent ulceration right foot. Unfortunately her lateral column has been previously resected resulting in an expected  varus attitude of her foot.     History of Present Illness:   The patient is otherwise doing well and offers no complaints. The patient is experiencing intermittent pain and anxiety. Denies gross pain or incisional issues. No fevers, chills, or night sweats.  Feels much improved since her previous visit.  She is wearing the AFO as ordered.  Denies any drainage from her right foot.  She did incur some gangrene in the distal forefoot area associated with the lateral aspect that was rather expected.  This has been limited and demarcated , cefadroxil post-op through 2.24.2025.  Has been using Santyl now for about 3 weeks.  She is does not describe any pain to her right foot.  She has been using her TMA cam walker when out of the house and using her AFO while in the house.  History of Present Illness  By way of history: The patient, with a history of diabetes and recent foot surgery for a bone infection, presents with intermittent stabbing pain in the foot. She describes the pain as infrequent and likens it to dying nerves or regrowth. She has been largely non-ambulatory, as advised by her partner and doctor, and has been wearing an Ankle-Foot Orthosis (AFO) for protection at home. Wearing TMA off loader with pegs BUT cracked now. The patient also reports significant anxiety, exacerbated by her limited mobility and concerns about her foot's healing process. She finds relief from prescribed muscle relaxants and has expressed a desire for more of these medications. The patient also reports recent issues with blood sugar control, experiencing frequent lows  and needing to consume more fruit juice than usual to manage these episodes.  She has used Santyl previously to her wound and UBM X2 following postop 5.6.2025. Using Biopad now good results. Also using felt padding plantar right foot.           Updated History: Patient returns now after her proximal TMA with newly acquired plantar lateral fourth metatarsal wound secondary to her structural impairment.  She states that her AFO is just not fitting her properly and she is moving within it is not offloading accordingly.  Duration at least 2 to 3 weeks, activity dependent, dull ache, 0-2 out of 10, unsure about the shoes offloading.  See above intervention.  She has been using Iodosorb to the area.  She has developed a new location and she has been ambulating but not safely.gradual onset variable may be worsening, dull ache, 0-2/10, some numbness, plantar lateral fourth metatarsal stump right foot, activity dependent, has been wearing the partial TMA bracing, some drainage, no odor, no fevers chills or night sweats.  Has obtained the new custom AFO as ordered from last visit, relates the new AFO is very beneficial, placing her foot in better alignment and she is able to ambulate.  She does not have a cast shoe but is able to walk on the AFO until what is obtained.  She relates that the wound is smaller draining less and she is satisfied, fortunately this is a consequence of her muscular imbalance gestations of her diabetes of the right foot.  CNS was previously cultured and  she was placed on Cipro,  she has had a couple applications of CORE, she has also recently fell broke her right shoulder slipping on wet leaves, last seen 10.15.2025.     Past Medical History:  Past Medical History:   Diagnosis Date    Autonomic neuropathy     Diabetes mellitus     HTN (hypertension)     Thyroid disease         Medications:   Current Outpatient Medications on File Prior to Visit   Medication Sig Dispense Refill    aspirin 81 mg  chewable tablet Chew and swallow 1 tablet (81 mg total) by mouth daily. 30 tablet 0    atorvastatin (LIPITOR) 40 MG tablet Take 1 tablet (40 mg total) by mouth daily. 30 tablet 0    cholecalciferol (Vitamin D3) 50 mcg (2,000 unit) capsule Take 1 capsule (2,000 Units total) by mouth daily. 30 capsule 0    LEVOthyroxine (SYNTHROID) 100 MCG tablet Take 1 tablet (100 mcg total) by mouth once daily. 30 tablet 0    melatonin 10 mg Tab Take 1 tablet (10 mg total) by mouth nightly. 30 tablet 0    midodrine  (PROAMATINE ) 10 MG tablet Take 1 tablet (10 mg total) by mouth 3 (three) times daily. 90 tablet 0    acetaminophen  (TYLENOL ) 325 MG tablet Take 2 tablets (650 mg total) by mouth every 6 (six) hours as needed for Pain. (Patient not taking: Reported on 03/16/2024) 90 tablet 0    MetoCLOpramide (REGLAN) 5 MG tablet Take 1 tablet (5 mg total) by mouth 4 (four) times daily as needed. (Patient not taking: Reported on 09/14/2024) 30 tablet 0    naloxone  (NARCAN ) nasal spray Use one spray in nostril for overdose. Repeat in other nostril in 3 minutes if minimal/no response. Call 911 (Patient not taking: Reported on 03/16/2024) 2 each 3    ondansetron  (ZOFRAN  ODT) 4 MG disintegrating tablet Dissolve 1 tablet (4 mg total) by mouth every 6 (six) hours as needed for Nausea. 20 tablet 0    oxyCODONE  (ROXICODONE ) 5 MG immediate release tablet Take 1 tablet (5 mg total) by mouth every 6 (six) hours as needed (for severe pain). (Patient not taking: Reported on 03/16/2024) 15 tablet 0    pantoprazole (PROTONIX) 40 mg EC tablet Take 1 tablet (40 mg total) by mouth daily. 30 tablet 0    polyethylene glycol (MIRALAX) powder Pour 17 grams of powder to the 'fill line' inside the cap and mix in 8 ounces of liquid and drink by mouth once daily as needed for Constipation. (Patient not taking: Reported on 03/16/2024) 238 g 0    sennosides (SENOKOT) 8.6 mg tablet Take 2 tablets by mouth 2 (two) times daily as needed for Constipation.  (Patient not taking: Reported on 03/16/2024) 60 tablet 0     No current facility-administered medications on file prior to visit.     Allergies:   Allergies/Adverse Reactions/Intolerances   Allergen Reactions    Sulfamethoxazole-Trimethoprim      Other Reaction(s): Thrombocytopenia    Cranberry Hives     Cerner Allergy Text Annotation: Cranberry; pt states she has been drinking cranberry juice this admission with no allergic reaction    Triprolidine-Pseudoephedrine Hives and Other (see comments)     Actifed. 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    Procedure: Amputation, Foot, Transmetatarsal;  Surgeon: Burnis Tanda CROME, DPM;  Location: [facility];  Service: Vascular;  Laterality: Right;    METATARSAL OSTECTOMY Right 11/15/2021    Procedure: Debridement of Foot, Resection of 2nd Metatarsal Head;  Surgeon: Tanda CROME Burnis, DPM;  Location: [facility];  Service: Vascular;  Laterality: Right;    METATARSAL OSTECTOMY Right 11/19/2021    Procedure: Excision, 1st and 3rd Metatarsals;  Surgeon: Tanda CROME Burnis, DPM;  Location: [facility] [place];  Service: Vascular;  Laterality: Right;    METATARSAL OSTECTOMY Right 03/04/2022    Procedure: Debridement of foot and resection of 4th metatarsal head;  Surgeon: Tanda CROME Burnis, DPM;  Location: [facility] [place];  Service: Vascular;  Laterality: Right;      Social History     Socioeconomic History    Marital status: Married     Spouse name: Not on file    Number of children: Not on file    Years of education: Not on file    Highest education level: Not on file   Occupational History    Not on file   Tobacco Use    Smoking status: Former     Types: Cigarettes    Smokeless tobacco: Former    Advertising Account Planner    Vaping status: Unknown   Substance and Sexual Activity    Alcohol  use: Not Currently    Drug use: Yes     Types: Marijuana    Sexual activity: Not Currently   Other Topics Concern    Not on file   Social History Narrative    Not on file     Social Drivers of Health     Financial Resource Strain: Low Risk  (09/09/2022)    Received from [facility]    Overall Financial Resource Strain (CARDIA)     Difficulty of Paying Living Expenses: Not hard at all   Food Insecurity: No Food Insecurity (11/03/2024)    [facility]    Hunger Vital Sign     Within the past 12 months, you worried that your food would run out before you got the money to buy more.: Never true     Within the past 12 months, the food you bought just didn't last and you didn't have money to get more.: Never true   Transportation Needs: No Transportation Needs (11/03/2024)    Received [facility]    PRAPARE - Transportation     In the past 12 months, has lack of transportation kept you from medical appointments or from getting medications?: No     In the past 12 months, has lack of transportation kept you from meetings, work, or from getting things needed for daily living?: No   Physical Activity: Insufficiently Active (09/09/2022)    Received from [facility]    Exercise Vital Sign     On average, how many days per week do you engage in moderate to strenuous exercise (like a brisk walk)?: 2 days     On average, how many minutes do you engage in exercise at this level?: 40 min   Stress: No Stress Concern Present (09/09/2022)    Received from [facility] - Occupational  Stress Questionnaire     Feeling of Stress : Not at all   Social Connections: Moderately Isolated (09/09/2022)    Received from [facility]    Social Connection and Isolation Panel     In a typical week, how many times do you talk on the phone with family,  friends, or neighbors?: More than three times a week     How often do you get together with friends or relatives?: Never     How often do you attend church or religious services?: Never     Do you belong to any clubs or organizations such as church groups, unions, fraternal or athletic groups, or school groups?: No     How often do you attend meetings of the clubs or organizations you belong to?: Never     Are you married, widowed, divorced, separated, never married, or living with a partner?: Married   Housing Stability: At Risk (11/03/2024)    Received from [facility]    Housing Stability NCSS     Do you have housing?: No     Are you worried about losing your housing?: No      No family history on file.       Review of Systems - A comprehensive 10+ review of systems was negative except as noted above.     Physical Exam  BP 127/63   Pulse 91   Temp 36.6 C (97.9 F) (Temporal)   Ht 1.727 m (5' 8)   Wt 72.6 kg (160 lb)   BMI 24.33 kg/m       Physical Exam       Gen: Well developed, well nourished. Well appearing, no acute distress, awake, alert, oriented X 3, good hygiene, good mood, good historian, and good habitus with husband.  Pulm: No respiratory distress.   Skin: The TMA incision right appears well healed, no signs of dehiscence, no swelling, no odor,  no pain on palpation, no drainage, no erythema. Resolved incisional necrosis right foot.  There is no odor, resolved callus about the plantar cuboid right foot.  There there is a transfer ulceration stump fourth metatarsal right foot 2.3cm x 2.0 cm 0.1 cm no probe to bone, granular, no slough, no odor, minimal swelling, minimal drainage, minimal callus wound edge.  Her cuboid and heel appear to be intact & quite stable.  Dorsal hair both feet: None  Skin color both feet: Decreased  Skin texture both feet: Decreased  Skin turgor both feet: Decreased  Callus either foot: None.   Nails:  Onychomycosis: negative  Length:  Short no onychia either foot.  Appearance:  normal  Ulceration:Yes stump plantar 4th metatarsal right foot.  Other: UT Ulcer Class:    Musculoskeletal: alignment and gait unstable   Muscle strength and tone in transverse planes, right foot: Decreased   Muscle strength and tone in transverse planes, left foot: Decreased   Muscle strength and tone in sagittal planes, right foot:Decreased   Muscle strength and tone in sagittal planes, left foot: Decreased     JOINT MOBILITY:  Range of motion of ankle right (<0 degrees): Yes  Range of motion of ankle left (<0 degrees): Yes  Range of motion of subtalar joint right (< 20 degrees total motion):Yes   Range of motion of subtalar joint left  (< 20 degrees total motion): Yes  Range of motion 1st MTPJ right( < DF 50 degrees): No  Range of motion 1st MTPJ left (< DF  50 degrees): Yes  Rigid toe contractures right foot: No   Rigid toe contractures left foot: Yes  Appearance of prior amputation sites, if applicable: Healing TMA right foot  Foot type:  Pes Rectus  Gait pattern, heel to toe both: Abnormal  Posture, arm swing and station: Abnormal  Soft tissue envelope has shrunken and there is prominence about the fourth metatarsal stump right foot.  There is some adductovarus component to her foot as well having lost her fifth ray from previous intervention.    Neuro: Awake & oriented x3.   Sharp/dull discrimination right foot: Decreased  Sharp/dull discrimination left foot: Decreased  Vibratory sensation both:  1st MTP: Decreased  Ankle: Decreased  Knee: Decreased  5.07 monofilament right foot: Decreased  5.07 monofilament left foot: Decreased  Proprioception right foot: Normal  Proprioception left foot: Normal  Motor coordination heel to patella to ankle right: Abnormal  Motor coordination heel to patella to ankle left: Abnormal  Achilles DTR right: Absent  Achilles DTR left: Absent  Patella DTR right: Absent  Patella DTR left: Absent  LOPS: Yes    Psych: Appropriate behavior,  normal affect, appropriate mood    Diabetic Wound Assessment - see above      Labs obtained, reviewed and discussed with patient:  CBC With Differential  Order: [id number] - Part of Panel Order [id number]  Status: Final result       Visible to patient: Yes (not seen)       Next appt: None    0 Result Notes               Component  Ref Range & Units 2 wk ago  (02/16/24) 2 wk ago  (02/14/24) 2 wk ago  (02/13/24) 2 wk ago  (02/12/24) 2 wk ago  (02/11/24) 3 wk ago  (02/10/24) 3 wk ago  (02/08/24)    White Blood Cell Count  4.50 - 11.00 K/cu mm 6.03 9.86 <redacted file path> 7.91 <redacted file path> 8.04 <redacted file path> 5.04 <redacted file path> 5.22 <redacted file path> 5.43 <redacted file path>    Red Blood Cell Count  4.00 - 5.20 M/cu mm 2.72 Low  3.01 <redacted file path> Low <redacted file path>  3.04 <redacted file path> Low <redacted file path>  3.15 <redacted file path> Low <redacted file path>  3.47 <redacted file path> Low <redacted file path>  3.56 <redacted file path> Low <redacted file path>  3.09 <redacted file path> Low <redacted file path>     Hemoglobin  12.0 - 15.0 g/dL 8.2 Low  8.9 <redacted file path> Low <redacted file path>  9.1 <redacted file path> Low <redacted file path>  9.5 <redacted file path> Low <redacted file path>  10.0 <redacted file path> Low <redacted file path>  10.6 <redacted file path> Low <redacted file path>  9.3 <redacted file path> Low <redacted file path>     Hematocrit  36.0 - 46.0 % 25.7 Low  28.2 <redacted file path> Low <redacted file path>  28.5 <redacted file path> Low <redacted file path>  29.5 <redacted file path> Low <redacted file path>  31.4 <redacted file path> Low <redacted file path>  33.1 <redacted file path> Low <redacted file path>  28.5 <redacted file path> Low <redacted file path>     Mean Corpuscular Volume  80.0 - 100.0 fL 94.5 93.7 <redacted file path> 93.8 <redacted file path> 93.7 <redacted file path> 90.5 <redacted file path> 93.0 <redacted file path> 92.2  <redacted file path>    Mean Corpus  Hgb  26.0 - 34.0 pg 30.1 29.6 <redacted file path> 29.9 <redacted file path> 30.2 <redacted file path> 28.8 <redacted file path> 29.8 <redacted file path> 30.1 <redacted file path>    Mean Corpus Hgb Conc  31.0 - 37.0 g/dL 68.0 68.3 <redacted file path> 31.9 <redacted file path> 32.2 <redacted file path> 31.8 <redacted file path> 32.0 <redacted file path> 32.6 <redacted file path>    RBC Distribution Width  11.5 - 14.5 % 12.9 12.8 <redacted file path> 12.8 <redacted file path> 12.9 <redacted file path> 12.7 <redacted file path> 12.7 <redacted file path> 13.0 <redacted file path>    Platelet Count  150 - 350 K/cu mm 286 340 <redacted file path> 368 <redacted file path> High <redacted file path>  372 <redacted file path> High <redacted file path>  407 <redacted file path> High <redacted file path>  431 <redacted file path> High <redacted file path>  379 <redacted file path> High <redacted file path>     Mean Platelet Volume  9.2 - 12.7 fL 10.9 10.7 <redacted file path> 10.7 <redacted file path> 10.6 <redacted file path> 10.4 <redacted file path> 10.7 <redacted file path> 11.1 <redacted file path>    Nucleated RBC Number  0.00 - 0.01 K/cu mm 0.00 0.00 <redacted file path> 0.00 <redacted file path> 0.00 <redacted file path> 0.00 <redacted file path> 0.00 <redacted file path> 0.00 <redacted file path>    Neutrophil %  40.0 - 70.0 % 62.5 76.0 <redacted file path> High <redacted file path>  61.4 <redacted file path> 62.4 <redacted file path> 52.0 <redacted file path> 48.6 <redacted file path> 54.1 <redacted file path>    Lymphocytes %  24.0 - 44.0 % 23.2 Low  13.3 <redacted file path> Low <redacted file path>  24.3 <redacted file path> 27.6 <redacted file path> 34.3 <redacted file path> 37.9 <redacted file path> 31.5 <redacted file path>    Monocyte %  2.0 - 11.0 % 9.0 8.5 <redacted file path> 10.1 <redacted file path> 7.1 <redacted file path> 8.7 <redacted file path> 8.0 <redacted file  path> 8.5 <redacted file path>    Eosinophil %  1.0 - 4.0 % 4.3 High  1.2 <redacted file path> 3.2 <redacted file path> 2.0 <redacted file path> 3.8 <redacted file path> 4.2 <redacted file path> High <redacted file path>  4.6 <redacted file path> High <redacted file path>     Basophil %  0.0 - 2.0 % 0.8 0.5 <redacted file path> 0.9 <redacted file path> 0.7 <redacted file path> 1.0 <redacted file path> 1.1 <redacted file path> 1.3 <redacted file path>    Immature Gran %  0.0 - 1.0 % 0.2 0.5 <redacted file path> CM 0.1 <redacted file path> CM 0.2 <redacted file path> CM 0.2 <redacted file path> CM 0.2 <redacted file path> CM 0.0 <redacted file path> CM   Comment: Immature Grans = Promyelocytes + Myleocytes + Metamyelocytes    ANC-Neutrophil Absolute  1.50 - 7.80 K/cu mm 3.77 7.49 <redacted file path> 4.86 <redacted file path> 5.01 <redacted file path> 2.62 <redacted file path> 2.53 <redacted file path> 2.94 <redacted file path>    Lymphocytes Absolute  1.10 - 4.80 K/cu mm 1.40 1.31 <redacted file path> 1.92 <redacted file path> 2.22 <redacted file path> 1.73 <redacted file path> 1.98 <redacted file path> 1.71 <redacted file path>    Monocyte Absolute  0.10 - 1.20 K/cu mm 0.54 0.84 <redacted file path> 0.80 <redacted file path> 0.57 <redacted file path> 0.44 <redacted file path> 0.42 <redacted file path> 0.46 <redacted file path>  Eosinophil Absolute  0.12 - 0.30 K/cu mm 0.26 0.12 <redacted file path> 0.25 <redacted file path> 0.16 <redacted file path> 0.19 <redacted file path> 0.22 <redacted file path> 0.25 <redacted file path>    Immature Granulocytes Abs  0.00 - 0.05 K/cu mm 0.01 0.05 <redacted file path> CM 0.01 <redacted file path> CM 0.02 <redacted file path> CM 0.01 <redacted file path> CM 0.01 <redacted file path> CM 0.00 <redacted file path> CM        Basic metabolic panel  Order: [id number]  Status: Final result       Visible to patient: Yes (not seen)       Next appt: None    0 Result Notes       1 HM  Topic               Component  Ref Range & Units 2 wk ago  (02/16/24) 2 wk ago  (02/14/24) 2 wk ago  (02/13/24) 2 wk ago  (02/12/24) 2 wk ago  (02/12/24) 2 wk ago  (02/11/24) 3 wk ago  (02/10/24)    Sodium  135 - 148 mmol/L 135 133 <redacted file path> Low <redacted file path>  133 <redacted file path> Low <redacted file path>  134 <redacted file path> Low <redacted file path>  132 <redacted file path> Low <redacted file path>  134 <redacted file path> Low <redacted file path>  135 <redacted file path>    Potassium  3.5 - 5.1 mmol/L 4.1 4.5 <redacted file path> 5.0 <redacted file path> 4.5 <redacted file path> 5.4 <redacted file path> High <redacted file path>  4.6 <redacted file path> 4.7 <redacted file path>    Chloride  96 - 109 mmol/L 98 96 <redacted file path> 98 <redacted file path> 97 <redacted file path> 96 <redacted file path> 99 <redacted file path> 97 <redacted file path>    Carbon Dioxide  21 - 31 mmol/L 28 25 <redacted file path> 26 <redacted file path> 26 <redacted file path> 26 <redacted file path> 26 <redacted file path> 28 <redacted file path> CM    Urea Nitrogen  7 - 22 mg/dL 9 13 <redacted file path> 12 <redacted file path> 13 <redacted file path> 13 <redacted file path> 9 <redacted file path> 9 <redacted file path>    Glucose  71 - 99 mg/dL 787 High  776 <redacted file path> High <redacted file path>  248 <redacted file path> High <redacted file path>  275 <redacted file path> High <redacted file path>  338 <redacted file path> High <redacted file path>  191 <redacted file path> High <redacted file path>  109 <redacted file path> High <redacted file path>     Calcium  8.4 - 10.5 mg/dL 9.3 9.4 <redacted file path> 9.7 <redacted file path> 9.6 <redacted file path> 9.1 <redacted file path> 10.1 <redacted file path> 10.6 <redacted file path> High <redacted file path>     Anion Gap  7 - 16 mmol/L 9 12 <redacted file path> 9 <redacted file path> 11 <redacted file path> 10 <redacted file path> 9 <redacted file  path> 10 <redacted file path>    BUN / Creatinine Ratio 8 11 <redacted file path> 10 <redacted file path> 9 <redacted file path> 10 <redacted file path> 6 <redacted file path> 8 <redacted file path>    eGFR -CKD-EPI Creatinine (2021)  >60 mL/min/1.73 sqm 59 Low  56 <redacted file path> Low <redacted file path>  53 <redacted file path> Low <redacted file path>  42 <redacted file path> Low <redacted file  path>  45 <redacted file path> Low <redacted file path>  44 <redacted file path> Low <redacted file path>  61 <redacted file path>    Creatinine  0.50 - 1.20 mg/dL 8.89 8.84 <redacted file path> 1.20 <redacted file path> 1.44 <redacted file path> High <redacted file path>  1.36 <redacted file path> High <redacted file path>  1.40 <redacted file path> High <redacted file path>  1.06 <redacted file path>    Total Protein     5.9 <redacted file path> Low <redacted file path>  R      Albumin     3.2 <redacted file path> Low <redacted file path>  R      Bilirubin,Total     0.2 <redacted file path> R      Alkaline Phosphatase     115 <redacted file path> R      Alanine Amino Trans     13 <redacted file path> R      AST/ALT Ratio     CM      Aspartate Amino Transferase     Hemolyzed <redacted file path> C          X-rays obtained, reviewed and discussed with patient:  Study Result    EXAM: XR FOOT RIGHT MIN 3 VWS    CLINICAL INFORMATION (sic): H/o non-healing wound plantar right foot 4th  metatarsal Dm    FINDINGS:    Interval transmetatarsal amputation at the bases with corticated  appearance of the stump of the fourth metatarsal. No evidence of  osteomyelitis. No evidence of ulceration or soft tissue gas in the  overlying musculoaponeurotic flap.      IMPRESSION: Postsurgical changes in the right foot without signs of  osteomyelitis.    Images and interpretation personally reviewed by: Blas Meals, M     Study Result    EXAM: MRI LOWER EXTREMITY RIGHT EXCLUDING JOINT W/WO CONTRAST    INDICATION: 21F s/p 5th toe  amputation,  chronic right foot wound with  worsening drainage.    TECHNIQUE: Multiplanar multisequence MR imaging of the right mid and  forefoot was performed on a 1.5 Tesla magnet before and after the  utilization of intravenous contrast.    CONTRAST: [gadobutroL  (GADAVIST ) injection 6.5 mL].    COMPARISON: Right foot radiographs 02/08/2024.    FINDINGS:  Changes post distal transmetatarsal amputation of the first through fourth  rays with amputation of the fifth metatarsal at the Lisfranc joint, and  concomitant amputation of the fifth digit. Arthrodesis at the proximal  interphalangeal joints of the second through fourth digits with solid  osseous fusion and retrograde countersunk screws.    There is marked signal drop-off on T1 with a pattern of bone marrow edema  involving the distal two thirds of the fourth metatarsal, extending from  roughly 2 cm distal to the Lisfranc joint to the stump. There is  concomitant signal drop-off involving the proximal phalanx of the fourth  digit.    In addition, there are phlegmonous changes with suspected subperiosteal  abscess formation draping circumferentially around the stump of the fourth  metatarsal, and extending to the pseudoarthrosis between the residual  fourth metatarsal and the base of the fourth digit. This extends plantarly  to the area of ulceration. Technical considerations, related to slice  thickness, as well as artifact due to prior instrumentation and surgery  preclude an accurate localization of foci of susceptibility artifact,  which may indicate the presence of soft tissue gas.      IMPRESSION:  MRI right lower extremity with and without contrast-    1. Findings consistent with osteomyelitis in the fourth metatarsal,  extending from the stump to the metatarsal base. No definite septic  arthritis of the Lisfranc joint.  2. No concomitant osteomyelitis at the base of the fourth digit.  3. Suspected periosteal phlegmonous abscess formation surrounding  the  stump of the fourth metatarsal, and extending circumferentially to the  base of the lateral forefoot, in the area of the ulceration.    Images and interpretation personally reviewed by: Jazmyne Hefflefinger, DO    Images and interpretation personally reviewed by: Blas Meals, MD       Vascular labs obtained, reviewed and discussed with patient:  Vascular Findings    Prior Study There was a previous exam performed on 11/12/2021.   Right ABI: 1.15         Left ABI: 1.00  Right TBI: 0.76         Left TBI: 0.77   ABI/Multi The right ankle brachial index was not calculated due to suprasystemic pedal pressures. The left ankle brachial index demonstrates normal arterial perfusion to the left lower extremity at rest. The right toe brachial index is normal.   The left toe brachial index is normal.   ABI Questionable reliability of the right ABI based on waveform morphology, likely falsely elevated. Questionable reliability of left ABI due to presence of non-compressible posterior tibial vessel.    Additional ankle and metatarsal waveforms obtained.        Procedure Data     Right Left   Arm 154 mmHg       151 mmHg         Posterior Tibial 154 mmHg       >254 mmHg         Dorsalis Pedis 153 mmHg       188 mmHg         Toe 110 mmHg       140 mmHg         Resting ABI 1.00       1.22         Resting TBI 0.71       0.91            Dorsalis Pedis right foot: Present  Dorsalis Pedis left foot: Present  Posterior Tibial right foot: Absent  Posterior Tibial left foot: Absent  Capillary filling time right foot: Under 3 seconds  Capillary filling time left foot: Under 3 seconds  Edema right leg, ankle and foot: Negative  Edema left leg, ankle and foot: Negative  Temperature right foot: Warm  Temperature left foot: Warm  Palpation of lymph nodes was completed in the lower extremities and none were found.    Microbiology obtained, reviewed and discussed with patient:    Contains abnormal data Bacterial Culture, NOT  Urine/Blood/Resp  Order: [id number]   Status: Final result       Next appt: 10/13/2024 at 10:00 AM in Vascular Surgery [facility]ad[facility] LITTIE Marcus, DPM)    Test Result Released: Yes (seen)    Specimen Information: Wound Surgical, Foot Right   0 Result Notes  Aerobic Misc Culture Very Light Mixed Skin Flora   Light growth Pseudomonas aeruginosa Abnormal    Very Light growth Pseudomonas aeruginosa Abnormal    second morphology            Gram stain Very Light Polymorphonuclear leukocytes   No organisms seen  Resulting Agency: [facility]     Susceptibility     Pseudomonas aeruginosa (1) Pseudomonas aeruginosa (2)     SUSCEPTIBILITY/INTERP SUSCEPTIBILITY/INTERP     Aztreonam 8 ug/mL S 8 ug/mL S     Cefepime 4 ug/mL S 4 ug/mL S     Ceftazidime 4 ug/mL S 4 ug/mL S     Ciprofloxacin 0.5 ug/mL S 0.5 ug/mL S     Meropenem <=0.5 ug/mL S <=0.5 ug/mL S     Piperacillin-Tazobactam 4/4 ug/mL S 8/4 ug/mL S           Comments    Bone            Order Questions    Question Answer   Workup? Aerobic and Anaerobic workup only   Release result to MyChart (visible to patient and proxy): Immediate   Note: If nothing is selected, the default is Immediate   Site# 1          Bacterial Culture, NOT Urine/Blood/Resp  Order: [id number]  Status: Final result         Visible to patient: Yes (not seen)         Next appt: None      Specimen Information: Tissue, Bone   0 Result Notes      Aerobic/Anaerobic Misc Culture No growth            Gram stain           Comments    E-swab            Collection Questions    Question Answer Comment   MICRO PROCESSING QUESTIONS    Microbiology processing comments:       Order Questions    Question Answer   Workup? Aerobic and Anaerobic workup only   Release result to MyChart (visible to patient and proxy): Immediate   Note: If nothing is selected, the default is Immediate   Site# 1            Bacterial Culture, NOT Urine/Blood/Resp  Order: [id number]  Status: Final result       Visible to patient: Yes (not seen)        Next appt: None    Specimen Information: Abscess, Other   0 Result Notes  Aerobic/Anaerobic Misc Culture No growth            Gram stain Very Light Squamous epithelial cells   No organisms seen             Surgical Pathology obtained, reviewed and discussed with patient:  Surgical pathology exam: Routine: [id number]  Order: [id number]  Status: Final result       Visible to patient: Yes (not seen)       Next appt: None    0 Result Notes      Component                Diagnosis      1. Foot, Right, Toes and Metatarsal (Transmetatarsal amputation): Toes and metatarsals with gangrenous necrosis.      2. Foot, Right, 4th Metarsal (Debridement and Amputation): Osteomyelitis.             Electronically signed by Sheldon Norleen Rush, MD on 02/28/2024 at 1530        The electronic signature attests that the above diagnosis is based upon the personal examination of the slides (and/or other material indicated in the diagnosis) by the responsible pathologist, and that the responsible pathologist has reviewed and  approved this report.   Gross Description    1. Foot, Right:     Specimen received: Fresh  Patient's name on label: [name] [name]   Specimen designation: Right toes and metatarsal     The specimen consists of a right forefoot amputation with attached digits 1-4 (7.0 x 5.5 x 2.5 cm) with 4 additional fragments of metatarsals, measuring 6.5 x 4.1 x 2.0 cm in aggregate.  The skin is pink-tan, wrinkled, and slightly indurated and erythematous at the junction of the third and fourth digit.  A screw is noted within the fourth digit.  Gross photographs are taken.  Representative sections are submitted following decalcification.     Summary of Sections  ------------------------------------  As of: 02/13/24 2:31 PM  ------------------------------------  1A - Block - 2 - Rep digit 4, decal  Total blocks - 1      Entered by Lorane Gobble, PA(ASCP), 02/13/24 2:00 PM.        2. Foot, Right:     Specimen received:  Fresh  Patient's name on label: [name] [name]   Specimen designation: Right 4th metatarsal     The specimen consists of a single piece of yellow-tan, focally cauterized bone measuring 2.5 x 1.5 x 1.5 cm.  A representative section is submitted following decalcification.     Summary of Sections  ------------------------------------  As of: 02/13/24 2:43 PM  ------------------------------------  2A - Block - 1 - Rep section, decal  Total blocks - 1      Entered by Lorane Gobble, PA(ASCP), 02/13/24 2:41 PM.          Assessment and Plan:  Diagnosis:  Diabetic Foot Ulcer: Yes plantar 4th metatarsal stump right.  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    Other osteomyelitis of right foot M86.8X7    Acute osteomyelitis of right ankle or foot M86.171    Osteomyelitis due to Staphylococcus aureus M86.9, A49.01    Diabetic foot infection E11.628, L08.9    Nausea and vomiting, unspecified vomiting type R11.2    Hyperglycemia R73.9    MSSA (methicillin susceptible Staphylococcus aureus) infection A49.01    Insulin  pump in place Z96.41    Stage I pressure ulcer of right heel L89.611    Diabetic ulcer of right midfoot associated with type 1 diabetes mellitus, with fat layer exposed E10.621, L97.412    Abscess of right foot L02.611    Cellulitis of right lower extremity L03.115    Leukemoid reaction D72.823    Anemia, unspecified type D64.9    Elevated alkaline phosphatase level R74.8    History of coronary artery disease Z86.79    History of hypothyroidism Z86.39    Diabetic ulcer of right midfoot associated with type 2 diabetes mellitus,  limited to breakdown of skin E11.621, L97.411    Osteomyelitis M86.9    Diabetic infection of right foot E11.628, L08.9    Type 1 diabetes mellitus with hyperglycemia, with long-term current use of insulin  E10.65    Streptococcus infection, group C A49.1    Chronic back pain M54.9, G89.29    Hyperlipidemia E78.5  Post-operative pain G89.18       Treatment:  Diabetes Education Provided: Yes  Surgical Debridement:   Mycotic nails: No  Hyperkeratosis (callus): No  Ulceration: Yes Debridement , subcutaneous tissue (includes epidermis and dermis) first 20 square cm or less: after wound preparation with Vashe solution, sharp tissue removal of the wound was performed ( i.e skin, full thickness) with a sterile 4mm curette. This debridement was based upon the wound characteristics noted in the above Wound Flowsheet. The debridement resulted in the removal of devitalized, fibrotic, yellow tissue until viable bleeding tissue occurred in hopes of improving the wound healing process. Hemostasis was achieved with Ag nitrate to the dermis. Location:  plantar right foot.     I&D of abscess: No  Wound Dressings:   Primary:  Aquacel (Ca/Ag) : No  Collagen:(Biopad): Yes then in about 1 week. Then every other day   Debriders: Santyl:No  Topical Abx Ointment: No  Hydrogel:(Silvasorb): No  Filler(Iodosorb): No  Contact Layer:   Adaptic:Yes  Mepilex/Transfer/Tritec Ag:No  DSD: Yes  NPWT (VAC): No                   ECM Biologics:   Heliogen: No   Amniotics (Grafix,CORE): Yes Cellular/Tissue-based product   The plantar right foot wound has been surgically prepared prior to the application of CORE with all handling consistent with the manufactures and FDA guideline. The approximate amount use is all with  none discarded. Every attempt is made to avoid wastage of product, it is our practice to only discard product that exceeds the surface area of the wound by greater than 0.5cm wound diameter. Medical necessity has been outline is  the diagnosis linked to this order, failure to heal with conventional standards of care noted in the historic wound measurements and by the prior physician notes. Adjunctive therapies are in place for this procedure consisting of off loading with the custom AFO. Serial/lot/batch numbers: LOT [id number], [id number]   Hyalomatrix: No  Other:   Mechanical off-loading:   Total Contact Cast: No  Removable Darco Off loader: No  Wedge Shoe: No  Darco softie: No   AFO: Yes         Darco cast shoe.   OR intervention: No  Assessment & Plan    Follow up evaluation and management completed, this is a complicated recurrent case.   The dorsal wound remains completely healed.  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   I have also made it clear to the patient that she that she may need some tendon rebalancing or even fusion to provide a more rectus foot and she has declined any type of evaluation or care along those lines.  Follow-up in ~ 2 weeks on a Wednesday AM to monitor wound healing, its overall progress and consider another application.  She has an uncertain prognosis.  The patient was provided with a clinical summary and treatment plan constructed for the condition and which was well understood when questioned.  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  Both were very pleasant.

## 2024-12-01 NOTE — Progress Notes (Signed)
 Ms. Scheff is a 58 y.o. female who presents for post-operative evaluation. 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  Subsequent gangrene and now regeneration of the anterior distal aspect of the right foot and healed.  Followup issue plantar lateral recurrent ulceration right foot. Unfortunately her lateral column has been previously resected resulting in an expected  varus attitude of her foot.     History of Present Illness:   The patient is otherwise doing well and offers no complaints. The patient is experiencing intermittent pain and anxiety. Denies gross pain or incisional issues. No fevers, chills, or night sweats.  Feels much improved since her previous visit.  She is wearing the AFO as ordered.  Denies any drainage from her right foot.  She did incur some gangrene in the distal forefoot area associated with the lateral aspect that was rather expected.  This has been limited and demarcated , cefadroxil post-op through 2.24.2025.  Has been using Santyl now for about 3 weeks.  She is does not describe any pain to her right foot.  She has been using her TMA cam walker when out of the house and using her AFO while in the house.  History of Present Illness  By way of history: The patient, with a history of diabetes and recent foot surgery for a bone infection, presents with intermittent stabbing pain in the foot. She describes the pain as infrequent and likens it to dying nerves or regrowth. She has been largely non-ambulatory, as advised by her partner and doctor, and has been wearing an Ankle-Foot Orthosis (AFO) for protection at home. Wearing TMA off loader with pegs BUT cracked now. The patient also reports significant anxiety, exacerbated by her limited mobility and concerns about her foot's healing process. She finds relief from prescribed muscle relaxants and has expressed a desire for more of these medications. The patient also reports recent issues with blood sugar control, experiencing frequent lows  and needing to consume more fruit juice than usual to manage these episodes.  She has used Santyl previously to her wound and UBM X2 following postop 5.6.2025. Using Biopad now good results. Also using felt padding plantar right foot.           Updated History: Patient returns now after her proximal TMA with newly acquired plantar lateral fourth metatarsal wound secondary to her structural impairment.  She states that her AFO is just not fitting her properly and she is moving within it is not offloading accordingly.  Duration at least 2 to 3 weeks, activity dependent, dull ache, 0-2 out of 10, unsure about the shoes offloading.  See above intervention.  She has been using Iodosorb to the area.  She has developed a new location and she has been ambulating but not safely.gradual onset variable may be worsening, dull ache, 0-2/10, some numbness, plantar lateral fourth metatarsal stump right foot, activity dependent, has been wearing the partial TMA bracing, some drainage, no odor, no fevers chills or night sweats.  Has obtained the new custom AFO as ordered from last visit, relates the new AFO is very beneficial, placing her foot in better alignment and she is able to ambulate.  She does not have a cast shoe but is able to walk on the AFO until what is obtained.  She relates that the wound is smaller draining less and she is satisfied, fortunately this is a consequence of her muscular imbalance gestations of her diabetes of the right foot.  CNS was previously cultured and  she was placed on Cipro,  she has had a couple applications of CORE, she has also recently fell broke her right shoulder slipping on wet leaves, she relates that her right foot is steadily improving, last seen 11.11.2025.     Past Medical History:  Past Medical History:   Diagnosis Date    Autonomic neuropathy     Diabetes mellitus     HTN (hypertension)     Thyroid disease         Medications:   Current Outpatient Medications on File Prior to  Visit   Medication Sig Dispense Refill    aspirin 81 mg chewable tablet Chew and swallow 1 tablet (81 mg total) by mouth daily. 30 tablet 0    atorvastatin (LIPITOR) 40 MG tablet Take 1 tablet (40 mg total) by mouth daily. 30 tablet 0    cholecalciferol (Vitamin D3) 50 mcg (2,000 unit) capsule Take 1 capsule (2,000 Units total) by mouth daily. 30 capsule 0    LEVOthyroxine (SYNTHROID) 100 MCG tablet Take 1 tablet (100 mcg total) by mouth once daily. 30 tablet 0    melatonin 10 mg Tab Take 1 tablet (10 mg total) by mouth nightly. 30 tablet 0    MetoCLOpramide (REGLAN) 5 MG tablet Take 1 tablet (5 mg total) by mouth 4 (four) times daily as needed. 30 tablet 0    midodrine  (PROAMATINE ) 10 MG tablet Take 1 tablet (10 mg total) by mouth 3 (three) times daily. 90 tablet 0    acetaminophen  (TYLENOL ) 325 MG tablet Take 2 tablets (650 mg total) by mouth every 6 (six) hours as needed for Pain. (Patient not taking: Reported on 03/16/2024) 90 tablet 0    naloxone  (NARCAN ) nasal spray Use one spray in nostril for overdose. Repeat in other nostril in 3 minutes if minimal/no response. Call 911 (Patient not taking: Reported on 03/16/2024) 2 each 3    ondansetron  (ZOFRAN  ODT) 4 MG disintegrating tablet Dissolve 1 tablet (4 mg total) by mouth every 6 (six) hours as needed for Nausea. 20 tablet 0    oxyCODONE  (ROXICODONE ) 5 MG immediate release tablet Take 1 tablet (5 mg total) by mouth every 6 (six) hours as needed (for severe pain). (Patient not taking: Reported on 03/16/2024) 15 tablet 0    pantoprazole (PROTONIX) 40 mg EC tablet Take 1 tablet (40 mg total) by mouth daily. 30 tablet 0    polyethylene glycol (MIRALAX) powder Pour 17 grams of powder to the 'fill line' inside the cap and mix in 8 ounces of liquid and drink by mouth once daily as needed for Constipation. (Patient not taking: Reported on 03/16/2024) 238 g 0    sennosides (SENOKOT) 8.6 mg tablet Take 2 tablets by mouth 2 (two) times daily as needed for  Constipation. (Patient not taking: Reported on 03/16/2024) 60 tablet 0     No current facility-administered medications on file prior to visit.     Allergies:   Allergies/Adverse Reactions/Intolerances   Allergen Reactions    Sulfamethoxazole-Trimethoprim      Other Reaction(s): Thrombocytopenia    Cranberry Hives     Cerner Allergy Text Annotation: Cranberry; pt states she has been drinking cranberry juice this admission with no allergic reaction    Triprolidine-Pseudoephedrine Hives and Other (see comments)     Actifed. 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    Procedure: Amputation, Foot, Transmetatarsal;  Surgeon: Burnis Tanda CROME, DPM;  Location: [facility];  Service: Vascular;  Laterality: Right;    METATARSAL OSTECTOMY Right 11/15/2021    Procedure: Debridement of Foot, Resection of 2nd Metatarsal Head;  Surgeon: Tanda CROME Burnis, DPM;  Location: [facility];  Service: Vascular;  Laterality: Right;    METATARSAL OSTECTOMY Right 11/19/2021    Procedure: Excision, 1st and 3rd Metatarsals;  Surgeon: Tanda CROME Burnis, DPM;  Location: [facility] [place] [facility];  Service: Vascular;  Laterality: Right;    METATARSAL OSTECTOMY Right 03/04/2022    Procedure: Debridement of foot and resection of 4th metatarsal head;  Surgeon: Tanda CROME Burnis, DPM;  Location: [facility] [place] [facility];  Service: Vascular;  Laterality: Right;      Social History     Socioeconomic History    Marital status: Married     Spouse name: Not on file    Number of children: Not on file    Years of education: Not on file    Highest education level: Not on file   Occupational History    Not on file   Tobacco Use    Smoking status: Former     Types: Cigarettes    Smokeless tobacco:  Former   Advertising Account Planner    Vaping status: Unknown   Substance and Sexual Activity    Alcohol  use: Not Currently    Drug use: Yes     Types: Marijuana    Sexual activity: Not Currently   Other Topics Concern    Not on file   Social History Narrative    Not on file     Social Drivers of Health     Tobacco Use: Medium Risk (12/01/2024)    Patient History     Smoking Tobacco Use: Former     Smokeless Tobacco Use: Former     Passive Exposure: Not on file   Alcohol  Use: Not At Risk (02/07/2024)    AUDIT-C     Frequency of Alcohol  Consumption: Never     Average Number of Drinks: Patient does not drink     Frequency of Binge Drinking: Never   Financial Resource Strain: Low Risk  (09/09/2022)    Received from [facility]    Overall Financial Resource Strain (CARDIA)     Difficulty of Paying Living Expenses: Not hard at all   Food Insecurity: No Food Insecurity (11/03/2024)    Received from [facility] [place]  [facility]    Hunger Vital Sign     Within the past 12 months, you worried that your food would run out before you got the money to buy more.: Never true     Within the past 12 months, the food you bought just didn't last and you didn't have money to get more.: Never true   Transportation Needs: No Transportation Needs (11/03/2024)    Received from [facility]    PRAPARE - Transportation     In the past 12 months, has lack of transportation kept you from medical appointments or from getting medications?: No     In the past 12 months, has lack of transportation kept you from meetings, work, or from getting things needed for daily living?: No   Physical Activity: Insufficiently Active (09/09/2022)  Received from [facility]    Exercise Vital Sign     On average, how many days per week do you engage in moderate to strenuous exercise (like a brisk walk)?: 2 days     On average, how many minutes do you engage in exercise at this level?: 40 min   Stress: No Stress Concern Present  (09/09/2022)    Received from [facility] - Occupational Stress Questionnaire     Feeling of Stress : Not at all   Social Connections: Moderately Isolated (09/09/2022)    Received from [facility]    Social Connection and Isolation Panel     In a typical week, how many times do you talk on the phone with family, friends, or neighbors?: More than three times a week     How often do you get together with friends or relatives?: Never     How often do you attend church or religious services?: Never     Do you belong to any clubs or organizations such as church groups, unions, fraternal or athletic groups, or school groups?: No     How often do you attend meetings of the clubs or organizations you belong to?: Never     Are you married, widowed, divorced, separated, never married, or living with a partner?: Married   Depression: Not At Risk (12/07/2021)    PHQ-2     PHQ-2 Score: 0   Housing Stability: At Risk (11/03/2024)    Received from [facility] [place]  [facility]    Housing Stability NCSS     Do you have housing?: No     Are you worried about losing your housing?: No   Interpersonal Violence: Not At Risk (11/03/2024)    Received from [facility] [place]    Humiliation, Afraid, Rape, and Kick questionnaire     Within the last year, have you been afraid of your partner or ex-partner?: No     Within the last year, have you been humiliated or emotionally abused in other ways by your partner or ex-partner?: No     Within the last year, have you been kicked, hit, slapped, or otherwise physically hurt by your partner or ex-partner?: No     Within the last year, have you been raped or forced to have any kind of sexual activity by your partner or ex-partner?: No   Utilities: Not At Risk (11/03/2024)    Received from [facility] [place]  [facility]    [facility] Utilities     In the past 12 months has the electric, gas, oil, or water  company  threatened to shut off services in your home?: No      No family history on file.       Review of Systems - A comprehensive 10+ review of systems was negative except as noted above.     Physical Exam  BP 121/67   Pulse 80   Temp 36.6 C (97.9 F) (Temporal)   Ht 1.727 m (5' 8)   Wt 71.7 kg (158 lb)   BMI 24.02 kg/m       Physical Exam       Gen: Well developed, well nourished. Well appearing, no acute distress, awake, alert, oriented X 3, good hygiene, good mood, good historian, and good habitus.  Skin: The TMA incision right appears well healed, no signs of dehiscence, no swelling, no odor,  no pain on palpation,  no drainage, no erythema. Resolved incisional necrosis right foot.  There is no odor, resolved callus about the plantar cuboid right foot.  There there is a transfer ulceration stump fourth metatarsal right foot 1.3 cm x 1.0 cm 0.3 cm no probe to bone, granular, no slough, no odor, minimal swelling, minimal drainage, minimal callus wound edge.  Her cuboid and heel appear to be intact & quite stable.  Dorsal hair both feet: None  Skin color both feet: Decreased  Skin texture both feet: Decreased  Skin turgor both feet: Decreased  Callus either foot: None.   Nails:  Onychomycosis: negative  Length: Short no onychia either foot.  Appearance:  normal  Ulceration:Yes stump plantar 4th metatarsal right foot.  Other: UT Ulcer Class:    Musculoskeletal: alignment and gait unstable   Muscle strength and tone in transverse planes, right foot: Decreased   Muscle strength and tone in transverse planes, left foot: Decreased   Muscle strength and tone in sagittal planes, right foot:Decreased   Muscle strength and tone in sagittal planes, left foot: Decreased     JOINT MOBILITY:  Range of motion of ankle right (<0 degrees): Yes  Range of motion of ankle left (<0 degrees): Yes  Range of motion of subtalar joint right (< 20 degrees total motion):Yes   Range of motion of subtalar joint left  (< 20 degrees total  motion): Yes  Range of motion 1st MTPJ right( < DF 50 degrees): No  Range of motion 1st MTPJ left (< DF 50 degrees): Yes  Rigid toe contractures right foot: No   Rigid toe contractures left foot: Yes  Appearance of prior amputation sites, if applicable: Healing TMA right foot  Foot type:  Pes Rectus  Gait pattern, heel to toe both: Abnormal  Posture, arm swing and station: Abnormal  Soft tissue envelope has shrunken and there is prominence about the fourth metatarsal stump right foot.  There is some adductovarus component to her foot as well having lost her fifth ray from previous intervention.    Neuro: Awake & oriented x3.   Sharp/dull discrimination right foot: Decreased  Sharp/dull discrimination left foot: Decreased  Vibratory sensation both:  1st MTP: Decreased  Ankle: Decreased  Knee: Decreased  5.07 monofilament right foot: Decreased  5.07 monofilament left foot: Decreased  Proprioception right foot: Normal  Proprioception left foot: Normal  Motor coordination heel to patella to ankle right: Abnormal  Motor coordination heel to patella to ankle left: Abnormal  Achilles DTR right: Absent  Achilles DTR left: Absent  Patella DTR right: Absent  Patella DTR left: Absent  LOPS: Yes    Psych: Appropriate behavior, normal affect, appropriate mood    Diabetic Wound Assessment - see above      Labs obtained, reviewed and discussed with patient:  CBC With Differential  Order: [id number] - Part of Panel Order [id number]  Status: Final result       Visible to patient: Yes (not seen)       Next appt: None    0 Result Notes               Component  Ref Range & Units 2 wk ago  (02/16/24) 2 wk ago  (02/14/24) 2 wk ago  (02/13/24) 2 wk ago  (02/12/24) 2 wk ago  (02/11/24) 3 wk ago  (02/10/24) 3 wk ago  (02/08/24)    White Blood Cell Count  4.50 - 11.00 K/cu mm 6.03 9.86 <redacted file path> 7.91 <redacted file path>  8.04 <redacted file path> 5.04 <redacted file path> 5.22 <redacted file path> 5.43 <redacted file path>    Red Blood  Cell Count  4.00 - 5.20 M/cu mm 2.72 Low  3.01 <redacted file path> Low <redacted file path>  3.04 <redacted file path> Low <redacted file path>  3.15 <redacted file path> Low <redacted file path>  3.47 <redacted file path> Low <redacted file path>  3.56 <redacted file path> Low <redacted file path>  3.09 <redacted file path> Low <redacted file path>     Hemoglobin  12.0 - 15.0 g/dL 8.2 Low  8.9 <redacted file path> Low <redacted file path>  9.1 <redacted file path> Low <redacted file path>  9.5 <redacted file path> Low <redacted file path>  10.0 <redacted file path> Low <redacted file path>  10.6 <redacted file path> Low <redacted file path>  9.3 <redacted file path> Low <redacted file path>     Hematocrit  36.0 - 46.0 % 25.7 Low  28.2 <redacted file path> Low <redacted file path>  28.5 <redacted file path> Low <redacted file path>  29.5 <redacted file path> Low <redacted file path>  31.4 <redacted file path> Low <redacted file path>  33.1 <redacted file path> Low <redacted file path>  28.5 <redacted file path> Low <redacted file path>     Mean Corpuscular Volume  80.0 - 100.0 fL 94.5 93.7 <redacted file path> 93.8 <redacted file path> 93.7 <redacted file path> 90.5 <redacted file path> 93.0 <redacted file path> 92.2 <redacted file path>    Mean Corpus Hgb  26.0 - 34.0 pg 30.1 29.6 <redacted file path> 29.9 <redacted file path> 30.2 <redacted file path> 28.8 <redacted file path> 29.8 <redacted file path> 30.1 <redacted file path>    Mean Corpus Hgb Conc  31.0 - 37.0 g/dL 68.0 68.3 <redacted file path> 31.9 <redacted file path> 32.2 <redacted file path> 31.8 <redacted file path> 32.0 <redacted file path> 32.6 <redacted file path>    RBC Distribution Width  11.5 - 14.5 % 12.9 12.8 <redacted file path> 12.8 <redacted file path> 12.9 <redacted file path> 12.7 <redacted file path> 12.7 <redacted file path> 13.0 <redacted file path>    Platelet Count  150 - 350 K/cu mm 286 340 <redacted file path> 368 <redacted file  path> High <redacted file path>  372 <redacted file path> High <redacted file path>  407 <redacted file path> High <redacted file path>  431 <redacted file path> High <redacted file path>  379 <redacted file path> High <redacted file path>     Mean Platelet Volume  9.2 - 12.7 fL 10.9 10.7 <redacted file path> 10.7 <redacted file path> 10.6 <redacted file path> 10.4 <redacted file path> 10.7 <redacted file path> 11.1 <redacted file path>    Nucleated RBC Number  0.00 - 0.01 K/cu mm 0.00 0.00 <redacted file path> 0.00 <redacted file path> 0.00 <redacted file path> 0.00 <redacted file path> 0.00 <redacted file path> 0.00 <redacted file path>    Neutrophil %  40.0 - 70.0 % 62.5 76.0 <redacted file path> High <redacted file path>  61.4 <redacted file path> 62.4 <redacted file path> 52.0 <redacted file path> 48.6 <redacted file path> 54.1 <redacted file path>    Lymphocytes %  24.0 - 44.0 % 23.2 Low  13.3 <redacted file path> Low <redacted file path>  24.3 <redacted file path> 27.6 <redacted file path> 34.3 <redacted file path> 37.9 <redacted file path> 31.5 <redacted file path>    Monocyte %  2.0 - 11.0 % 9.0 8.5 <redacted file path> 10.1 <redacted file path>  7.1 <redacted file path> 8.7 <redacted file path> 8.0 <redacted file path> 8.5 <redacted file path>    Eosinophil %  1.0 - 4.0 % 4.3 High  1.2 <redacted file path> 3.2 <redacted file path> 2.0 <redacted file path> 3.8 <redacted file path> 4.2 <redacted file path> High <redacted file path>  4.6 <redacted file path> High <redacted file path>     Basophil %  0.0 - 2.0 % 0.8 0.5 <redacted file path> 0.9 <redacted file path> 0.7 <redacted file path> 1.0 <redacted file path> 1.1 <redacted file path> 1.3 <redacted file path>    Immature Gran %  0.0 - 1.0 % 0.2 0.5 <redacted file path> CM 0.1 <redacted file path> CM 0.2 <redacted file path> CM 0.2 <redacted file path> CM 0.2 <redacted file path> CM 0.0 <redacted file path> CM   Comment: Immature Grans = Promyelocytes +  Myleocytes + Metamyelocytes    ANC-Neutrophil Absolute  1.50 - 7.80 K/cu mm 3.77 7.49 <redacted file path> 4.86 <redacted file path> 5.01 <redacted file path> 2.62 <redacted file path> 2.53 <redacted file path> 2.94 <redacted file path>    Lymphocytes Absolute  1.10 - 4.80 K/cu mm 1.40 1.31 <redacted file path> 1.92 <redacted file path> 2.22 <redacted file path> 1.73 <redacted file path> 1.98 <redacted file path> 1.71 <redacted file path>    Monocyte Absolute  0.10 - 1.20 K/cu mm 0.54 0.84 <redacted file path> 0.80 <redacted file path> 0.57 <redacted file path> 0.44 <redacted file path> 0.42 <redacted file path> 0.46 <redacted file path>    Eosinophil Absolute  0.12 - 0.30 K/cu mm 0.26 0.12 <redacted file path> 0.25 <redacted file path> 0.16 <redacted file path> 0.19 <redacted file path> 0.22 <redacted file path> 0.25 <redacted file path>    Immature Granulocytes Abs  0.00 - 0.05 K/cu mm 0.01 0.05 <redacted file path> CM 0.01 <redacted file path> CM 0.02 <redacted file path> CM 0.01 <redacted file path> CM 0.01 <redacted file path> CM 0.00 <redacted file path> CM        Basic metabolic panel  Order: [id number]  Status: Final result       Visible to patient: Yes (not seen)       Next appt: None    0 Result Notes       1 HM Topic               Component  Ref Range & Units 2 wk ago  (02/16/24) 2 wk ago  (02/14/24) 2 wk ago  (02/13/24) 2 wk ago  (02/12/24) 2 wk ago  (02/12/24) 2 wk ago  (02/11/24) 3 wk ago  (02/10/24)    Sodium  135 - 148 mmol/L 135 133 <redacted file path> Low <redacted file path>  133 <redacted file path> Low <redacted file path>  134 <redacted file path> Low <redacted file path>  132 <redacted file path> Low <redacted file path>  134 <redacted file path> Low <redacted file path>  135 <redacted file path>    Potassium  3.5 - 5.1 mmol/L 4.1 4.5 <redacted file path> 5.0 <redacted file path> 4.5 <redacted file path> 5.4 <redacted file path> High <redacted file path>  4.6 <redacted file path> 4.7 <redacted file  path>    Chloride  96 - 109 mmol/L 98 96 <redacted file path> 98 <redacted file path> 97 <redacted file path> 96 <redacted file path> 99 <redacted file path> 97 <redacted file path>    Carbon Dioxide  21 - 31 mmol/L 28 25 <redacted file path> 26 <redacted file path> 26 <  redacted file path> 26 <redacted file path> 26 <redacted file path> 28 <redacted file path> CM    Urea Nitrogen  7 - 22 mg/dL 9 13 <redacted file path> 12 <redacted file path> 13 <redacted file path> 13 <redacted file path> 9 <redacted file path> 9 <redacted file path>    Glucose  71 - 99 mg/dL 787 High  776 <redacted file path> High <redacted file path>  248 <redacted file path> High <redacted file path>  275 <redacted file path> High <redacted file path>  338 <redacted file path> High <redacted file path>  191 <redacted file path> High <redacted file path>  109 <redacted file path> High <redacted file path>     Calcium  8.4 - 10.5 mg/dL 9.3 9.4 <redacted file path> 9.7 <redacted file path> 9.6 <redacted file path> 9.1 <redacted file path> 10.1 <redacted file path> 10.6 <redacted file path> High <redacted file path>     Anion Gap  7 - 16 mmol/L 9 12 <redacted file path> 9 <redacted file path> 11 <redacted file path> 10 <redacted file path> 9 <redacted file path> 10 <redacted file path>    BUN / Creatinine Ratio 8 11 <redacted file path> 10 <redacted file path> 9 <redacted file path> 10 <redacted file path> 6 <redacted file path> 8 <redacted file path>    eGFR -CKD-EPI Creatinine (2021)  >60 mL/min/1.73 sqm 59 Low  56 <redacted file path> Low <redacted file path>  53 <redacted file path> Low <redacted file path>  42 <redacted file path> Low <redacted file path>  45 <redacted file path> Low <redacted file path>  44 <redacted file path> Low <redacted file path>  61 <redacted file path>    Creatinine  0.50 - 1.20 mg/dL 8.89 8.84 <redacted file path> 1.20 <redacted file path> 1.44 <redacted file path> High <redacted file path>  1.36 <redacted file path>  High <redacted file path>  1.40 <redacted file path> High <redacted file path>  1.06 <redacted file path>    Total Protein     5.9 <redacted file path> Low <redacted file path>  R      Albumin     3.2 <redacted file path> Low <redacted file path>  R      Bilirubin,Total     0.2 <redacted file path> R      Alkaline Phosphatase     115 <redacted file path> R      Alanine Amino Trans     13 <redacted file path> R      AST/ALT Ratio     CM      Aspartate Amino Transferase     Hemolyzed <redacted file path> C          X-rays obtained, reviewed and discussed with patient:  Study Result    EXAM: XR FOOT RIGHT MIN 3 VWS  .    INDICATION: Evaluate osteomyelitis 4th metatarsal base. Right foot.  COMPARISON: 08/24/2024  TECHNIQUE: XR FOOT RIGHT MIN 3 VWS.    FINDINGS:  XR FOOT RIGHT MIN 3 VWS -  See impression.      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  Soft tissue ulceration at the plantar lateral aspect of the foot extending  near to the lateral plantar aspect of the remnant fourth metatarsal base,  with associated demineralization, cortical irregularity and fragmentation,  concerning for osteomyelitis.  Minimal lucency and irregularity at the distal lateral  aspect of the third  metatarsal base, not definitely changed from prior accounting for  technical differences; however given the proximity of the soft tissue  ulceration, involvement of the third metatarsal base is not excluded.    THIS REPORT CONTAINS FINDINGS THAT MAY BE CRITICAL TO PATIENT CARE.    Finding observed date/time: 12:30 PM on 12/01/2024    These findings: Impression  were communicated by secure chat with  confirmation of receipt  to [facility] DPM at 12:33 PM on 12/01/2024    FLAG: (U)        Images and interpretation personally reviewed by: Prentice Noel, MD       Vascular labs obtained, reviewed and discussed with patient:  Vascular  Findings    Prior Study There was a previous exam performed on 11/12/2021.   Right ABI: 1.15         Left ABI: 1.00  Right TBI: 0.76         Left TBI: 0.77   ABI/Multi The right ankle brachial index was not calculated due to suprasystemic pedal pressures. The left ankle brachial index demonstrates normal arterial perfusion to the left lower extremity at rest. The right toe brachial index is normal.   The left toe brachial index is normal.   ABI Questionable reliability of the right ABI based on waveform morphology, likely falsely elevated. Questionable reliability of left ABI due to presence of non-compressible posterior tibial vessel.    Additional ankle and metatarsal waveforms obtained.        Procedure Data     Right Left   Arm 154 mmHg       151 mmHg         Posterior Tibial 154 mmHg       >254 mmHg         Dorsalis Pedis 153 mmHg       188 mmHg         Toe 110 mmHg       140 mmHg         Resting ABI 1.00       1.22         Resting TBI 0.71       0.91            Dorsalis Pedis right foot: Present  Dorsalis Pedis left foot: Present  Posterior Tibial right foot: Absent  Posterior Tibial left foot: Absent  Capillary filling time right foot: Under 3 seconds  Capillary filling time left foot: Under 3 seconds  Edema right leg, ankle and foot: Negative  Edema left leg, ankle and foot: Negative  Temperature right foot: Warm  Temperature left foot: Warm  Palpation of lymph nodes was completed in the lower extremities and none were found.    Microbiology obtained, reviewed and discussed with patient:    Contains abnormal data Bacterial Culture, NOT Urine/Blood/Resp  Order: [id number]   Status: Final result       Next appt: 10/13/2024 at 10:00 AM in Vascular Surgery [facility]ad[facility] LITTIE Marcus, DPM)    Test Result Released: Yes (seen)    Specimen Information: Wound Surgical, Foot Right   0 Result Notes  Aerobic Misc Culture Very Light Mixed Skin Flora   Light growth Pseudomonas aeruginosa Abnormal    Very Light growth Pseudomonas  aeruginosa Abnormal    second morphology            Gram stain Very Light Polymorphonuclear leukocytes   No organisms seen  Resulting Agency: [facility]     Susceptibility     Pseudomonas aeruginosa (1) Pseudomonas aeruginosa (2)     SUSCEPTIBILITY/INTERP SUSCEPTIBILITY/INTERP     Aztreonam 8 ug/mL S 8 ug/mL S     Cefepime 4 ug/mL S 4 ug/mL S     Ceftazidime 4 ug/mL S 4 ug/mL S     Ciprofloxacin 0.5 ug/mL S 0.5 ug/mL S     Meropenem <=0.5 ug/mL S <=0.5 ug/mL S     Piperacillin-Tazobactam 4/4 ug/mL S 8/4 ug/mL S           Comments    Bone            Order Questions    Question Answer   Workup? Aerobic and Anaerobic workup only   Release result to MyChart (visible to patient and proxy): Immediate   Note: If nothing is selected, the default is Immediate   Site# 1          Bacterial Culture, NOT Urine/Blood/Resp  Order: [id number]  Status: Final result         Visible to patient: Yes (not seen)         Next appt: None      Specimen Information: Tissue, Bone   0 Result Notes      Aerobic/Anaerobic Misc Culture No growth            Gram stain           Comments    E-swab            Collection Questions    Question Answer Comment   MICRO PROCESSING QUESTIONS    Microbiology processing comments:       Order Questions    Question Answer   Workup? Aerobic and Anaerobic workup only   Release result to MyChart (visible to patient and proxy): Immediate   Note: If nothing is selected, the default is Immediate   Site# 1            Bacterial Culture, NOT Urine/Blood/Resp  Order: [id number]  Status: Final result       Visible to patient: Yes (not seen)       Next appt: None    Specimen Information: Abscess, Other   0 Result Notes  Aerobic/Anaerobic Misc Culture No growth            Gram stain Very Light Squamous epithelial cells   No organisms seen             Surgical Pathology obtained, reviewed and discussed with patient:  Surgical pathology exam: Routine: [id number]  Order: [id number]  Status: Final result       Visible to  patient: Yes (not seen)       Next appt: None    0 Result Notes      Component                Diagnosis      1. Foot, Right, Toes and Metatarsal (Transmetatarsal amputation): Toes and metatarsals with gangrenous necrosis.      2. Foot, Right, 4th Metarsal (Debridement and Amputation): Osteomyelitis.             Electronically signed by Sheldon Norleen Rush, MD on 02/28/2024 at 1530        The electronic signature attests that the above diagnosis is based upon the personal examination of the slides (and/or other material indicated in the diagnosis) by the responsible pathologist, and that the responsible pathologist has reviewed and  approved this report.   Gross Description    1. Foot, Right:     Specimen received: Fresh  Patient's name on label: Rollene Hun Rapaport   Specimen designation: Right toes and metatarsal     The specimen consists of a right forefoot amputation with attached digits 1-4 (7.0 x 5.5 x 2.5 cm) with 4 additional fragments of metatarsals, measuring 6.5 x 4.1 x 2.0 cm in aggregate.  The skin is pink-tan, wrinkled, and slightly indurated and erythematous at the junction of the third and fourth digit.  A screw is noted within the fourth digit.  Gross photographs are taken.  Representative sections are submitted following decalcification.     Summary of Sections  ------------------------------------  As of: 02/13/24 2:31 PM  ------------------------------------  1A - Block - 2 - Rep digit 4, decal  Total blocks - 1      Entered by Lorane Gobble, PA(ASCP), 02/13/24 2:00 PM.        2. Foot, Right:     Specimen received: Fresh  Patient's name on label: Rollene Hun Setzler   Specimen designation: Right 4th metatarsal     The specimen consists of a single piece of yellow-tan, focally cauterized bone measuring 2.5 x 1.5 x 1.5 cm.  A representative section is submitted following decalcification.     Summary of Sections  ------------------------------------  As of: 02/13/24 2:43  PM  ------------------------------------  2A - Block - 1 - Rep section, decal  Total blocks - 1      Entered by Lorane Gobble, PA(ASCP), 02/13/24 2:41 PM.          Assessment and Plan:  Diagnosis:  Diabetic Foot Ulcer: Yes plantar 4th metatarsal stump right.  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    Other osteomyelitis of right foot M86.8X7    Acute osteomyelitis of right ankle or foot M86.171    Osteomyelitis due to Staphylococcus aureus M86.9, A49.01    Diabetic foot infection E11.628, L08.9    Nausea and vomiting, unspecified vomiting type R11.2    Hyperglycemia R73.9    MSSA (methicillin susceptible Staphylococcus aureus) infection A49.01    Insulin  pump in place Z96.41    Stage I pressure ulcer of right heel L89.611    Diabetic ulcer of right midfoot associated with type 1 diabetes mellitus, with fat layer exposed E10.621, L97.412    Abscess of right foot L02.611    Cellulitis of right lower extremity L03.115    Leukemoid reaction D72.823    Anemia, unspecified type D64.9    Elevated alkaline phosphatase level R74.8    History of coronary artery disease Z86.79    History of hypothyroidism Z86.39    Diabetic ulcer of right midfoot associated with type 2 diabetes mellitus, limited to breakdown of skin E11.621, L97.411    Osteomyelitis M86.9    Diabetic infection of right foot E11.628, L08.9    Type 1 diabetes mellitus with hyperglycemia, with long-term current use of insulin  E10.65    Streptococcus infection, group C A49.1    Chronic back pain M54.9, G89.29    Hyperlipidemia E78.5  Post-operative pain G89.18        Treatment:  Diabetes Education Provided: Yes  Surgical Debridement:   Mycotic nails: No  Hyperkeratosis (callus): No  Ulceration: Yes Debridement , subcutaneous tissue (includes epidermis and dermis) first 20 square cm or less: after wound preparation with Vashe solution, sharp tissue removal of the wound was performed ( i.e skin, full thickness) with a sterile 4mm curette. This debridement was based upon the wound characteristics noted in the above Wound Flowsheet. The debridement resulted in the removal of devitalized, fibrotic, yellow tissue until viable bleeding tissue occurred in hopes of improving the wound healing process. Hemostasis was achieved with Ag nitrate to the dermis. Location:  plantar right foot.     I&D of abscess: No  Wound Dressings:   Primary:  Aquacel (Ca/Ag) : No  Collagen:(Biopad): Yes then in about 1 week. Then every other day   Debriders: Santyl:No  Topical Abx Ointment: No  Hydrogel:(Silvasorb): No  Filler(Iodosorb): No  Contact Layer:   Adaptic:Yes  Mepilex/Transfer/Tritec Ag:No  DSD: Yes  NPWT (VAC): No                   ECM Biologics:   Heliogen: No   Amniotics (Grafix,CORE): Yes Cellular/Tissue-based product   The plantar right foot wound has been surgically prepared prior to the application of CORE with all handling consistent with the manufactures and FDA guideline. The approximate amount use is all with  none discarded. Every attempt is made to avoid wastage of product, it is our practice to only discard product that exceeds the surface area of the wound by greater than 0.5cm wound diameter. Medical necessity has been outline is the diagnosis linked to this order, failure to heal with conventional standards of care noted in the historic wound measurements and by the prior physician notes. Adjunctive therapies are in place for this procedure consisting of off loading with the custom AFO. Serial/lot/batch numbers: LOT [id number], [id number].   Hyalomatrix: No  Other:   Mechanical  off-loading:   Total Contact Cast: No  Removable Darco Off loader: No  Wedge Shoe: No  Darco softie: No   AFO: Yes         Darco cast shoe.   OR intervention: No  Assessment & Plan    Follow up evaluation and management completed, this is a complicated recurrent case.   The dorsal wound remains completely healed.  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   I have also made it clear to the patient that she that she may need some tendon rebalancing or even fusion to provide a more rectus foot and she has declined any type of evaluation or care along those lines.  Follow-up in ~ 2 weeks on a Wednesday AM to monitor wound healing, its overall progress and consider another application.  She has an uncertain prognosis.  I have reviewed the x-ray and I also concur that there are some changes about the distal fourth metatarsal and I am unsure if it is just pressure changes or osteomyelitis.  Her foot has never been red swollen or draining so we will continue to monitor this accordingly and we will get another x-ray in about 3 to 4 weeks.    The patient was provided with a clinical summary and treatment plan constructed for the condition and which was well understood when questioned.  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  She was very pleasant.

## 2024-12-15 NOTE — Progress Notes (Signed)
 [name] [name] is a 58 y.o. female who presents for post-operative evaluation. 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  Subsequent gangrene and now regeneration of the anterior distal aspect of the right foot and healed.  Followup issue plantar lateral recurrent ulceration right foot. Unfortunately her lateral column has been previously resected resulting in an expected  varus attitude of her foot.     History of Present Illness:   The patient is otherwise doing well and offers no complaints. The patient is experiencing intermittent pain and anxiety. Denies gross pain or incisional issues. No fevers, chills, or night sweats.  Feels much improved since her previous visit.  She is wearing the AFO as ordered.  Denies any drainage from her right foot.  She did incur some gangrene in the distal forefoot area associated with the lateral aspect that was rather expected.  This has been limited and demarcated , cefadroxil post-op through 2.24.2025.  Has been using Santyl now for about 3 weeks.  She is does not describe any pain to her right foot.  She has been using her TMA cam walker when out of the house and using her AFO while in the house.  History of Present Illness  By way of history: The patient, with a history of diabetes and recent foot surgery for a bone infection, presents with intermittent stabbing pain in the foot. She describes the pain as infrequent and likens it to dying nerves or regrowth. She has been largely non-ambulatory, as advised by her partner and doctor, and has been wearing an Ankle-Foot Orthosis (AFO) for protection at home. Wearing TMA off loader with pegs BUT cracked now. The patient also reports significant anxiety, exacerbated by her limited mobility and concerns about her foot's healing process. She finds relief from prescribed muscle relaxants and has expressed a desire for more of these medications. The patient also reports recent issues with blood sugar control, experiencing frequent lows  and needing to consume more fruit juice than usual to manage these episodes.  She has used Santyl previously to her wound and UBM X2 following postop 5.6.2025. Using Biopad now good results. Also using felt padding plantar right foot.           Updated History: Patient returns now after her proximal TMA with newly acquired plantar lateral fourth metatarsal wound secondary to her structural impairment.  She states that her AFO is just not fitting her properly and she is moving within it is not offloading accordingly.  Duration at least 2 to 3 weeks, activity dependent, dull ache, 0-2 out of 10, unsure about the shoes offloading.  See above intervention.  She has been using Iodosorb to the area.  She has developed a new location and she has been ambulating but not safely.gradual onset variable may be worsening, dull ache, 0-2/10, some numbness, plantar lateral fourth metatarsal stump right foot, activity dependent, has been wearing the partial TMA bracing, some drainage, no odor, no fevers chills or night sweats.  Has obtained the new custom AFO as ordered from last visit, relates the new AFO is very beneficial, placing her foot in better alignment and she is able to ambulate.  She does not have a cast shoe but is able to walk on the AFO until what is obtained.  She relates that the wound is smaller draining less and she is satisfied, fortunately this is a consequence of her muscular imbalance gestations of her diabetes of the right foot.  CNS was previously cultured and  she was placed on Cipro,  she has had a couple applications of CORE, she has also recently fell broke her right shoulder slipping on wet leaves, she relates that her right foot is steadily improving, last seen 12.3.2025.     Past Medical History:  Past Medical History:   Diagnosis Date    Autonomic neuropathy     Diabetes mellitus     HTN (hypertension)     Thyroid disease         Medications:   Current Outpatient Medications on File Prior to  Visit   Medication Sig Dispense Refill    aspirin 81 mg chewable tablet Chew and swallow 1 tablet (81 mg total) by mouth daily. 30 tablet 0    atorvastatin (LIPITOR) 40 MG tablet Take 1 tablet (40 mg total) by mouth daily. 30 tablet 0    cholecalciferol (Vitamin D3) 50 mcg (2,000 unit) capsule Take 1 capsule (2,000 Units total) by mouth daily. 30 capsule 0    LEVOthyroxine (SYNTHROID) 100 MCG tablet Take 1 tablet (100 mcg total) by mouth once daily. 30 tablet 0    melatonin 10 mg Tab Take 1 tablet (10 mg total) by mouth nightly. 30 tablet 0    MetoCLOpramide (REGLAN) 5 MG tablet Take 1 tablet (5 mg total) by mouth 4 (four) times daily as needed. 30 tablet 0    midodrine  (PROAMATINE ) 10 MG tablet Take 1 tablet (10 mg total) by mouth 3 (three) times daily. 90 tablet 0    acetaminophen  (TYLENOL ) 325 MG tablet Take 2 tablets (650 mg total) by mouth every 6 (six) hours as needed for Pain. (Patient not taking: Reported on 03/16/2024) 90 tablet 0    naloxone  (NARCAN ) nasal spray Use one spray in nostril for overdose. Repeat in other nostril in 3 minutes if minimal/no response. Call 911 (Patient not taking: Reported on 03/16/2024) 2 each 3    ondansetron  (ZOFRAN  ODT) 4 MG disintegrating tablet Dissolve 1 tablet (4 mg total) by mouth every 6 (six) hours as needed for Nausea. 20 tablet 0    oxyCODONE  (ROXICODONE ) 5 MG immediate release tablet Take 1 tablet (5 mg total) by mouth every 6 (six) hours as needed (for severe pain). (Patient not taking: Reported on 03/16/2024) 15 tablet 0    pantoprazole (PROTONIX) 40 mg EC tablet Take 1 tablet (40 mg total) by mouth daily. 30 tablet 0    polyethylene glycol (MIRALAX) powder Pour 17 grams of powder to the 'fill line' inside the cap and mix in 8 ounces of liquid and drink by mouth once daily as needed for Constipation. (Patient not taking: Reported on 03/16/2024) 238 g 0    sennosides (SENOKOT) 8.6 mg tablet Take 2 tablets by mouth 2 (two) times daily as needed for  Constipation. (Patient not taking: Reported on 03/16/2024) 60 tablet 0     No current facility-administered medications on file prior to visit.     Allergies:   Allergies/Adverse Reactions/Intolerances   Allergen Reactions    Sulfamethoxazole-Trimethoprim      Other Reaction(s): Thrombocytopenia    Cranberry Hives     Cerner Allergy Text Annotation: Cranberry; pt states she has been drinking cranberry juice this admission with no allergic reaction    Triprolidine-Pseudoephedrine Hives and Other (see comments)     Actifed. 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    Procedure: Amputation, Foot, Transmetatarsal;  Surgeon: Burnis Tanda CROME, DPM;  Location: [facility];  Service: Vascular;  Laterality: Right;    METATARSAL OSTECTOMY Right 11/15/2021    Procedure: Debridement of Foot, Resection of 2nd Metatarsal Head;  Surgeon: Tanda CROME Burnis, DPM;  Location: [facility];  Service: Vascular;  Laterality: Right;    METATARSAL OSTECTOMY Right 11/19/2021    Procedure: Excision, 1st and 3rd Metatarsals;  Surgeon: Tanda CROME Burnis, DPM;  Location: [facility] [place] [facility];  Service: Vascular;  Laterality: Right;    METATARSAL OSTECTOMY Right 03/04/2022    Procedure: Debridement of foot and resection of 4th metatarsal head;  Surgeon: Tanda CROME Burnis, DPM;  Location: [facility] [place] [facility];  Service: Vascular;  Laterality: Right;      Social History     Socioeconomic History    Marital status: Married     Spouse name: Not on file    Number of children: Not on file    Years of education: Not on file    Highest education level: Not on file   Occupational History    Not on file   Tobacco Use    Smoking status: Former     Types: Cigarettes    Smokeless tobacco:  Former   Advertising Account Planner    Vaping status: Unknown   Substance and Sexual Activity    Alcohol  use: Not Currently    Drug use: Yes     Types: Marijuana    Sexual activity: Not Currently   Other Topics Concern    Not on file   Social History Narrative    Not on file     Social Drivers of Health     Tobacco Use: Medium Risk (12/15/2024)    Patient History     Smoking Tobacco Use: Former     Smokeless Tobacco Use: Former     Passive Exposure: Not on file   Alcohol  Use: Not At Risk (02/07/2024)    AUDIT-C     Frequency of Alcohol  Consumption: Never     Average Number of Drinks: Patient does not drink     Frequency of Binge Drinking: Never   Financial Resource Strain: Low Risk (09/09/2022)    Received from [facility]    Overall Financial Resource Strain (CARDIA)     Difficulty of Paying Living Expenses: Not hard at all   Food Insecurity: No Food Insecurity (11/03/2024)    Received [facility] [place]  [facility]    Hunger Vital Sign     Within the past 12 months, you worried that your food would run out before you got the money to buy more.: Never true     Within the past 12 months, the food you bought just didn't last and you didn't have money to get more.: Never true   Transportation Needs: No Transportation Needs (11/03/2024)    Received from [facility]    PRAPARE - Transportation     In the past 12 months, has lack of transportation kept you from medical appointments or from getting medications?: No     In the past 12 months, has lack of transportation kept you from meetings, work, or from getting things needed for daily living?: No   Physical Activity: Insufficiently Active (09/09/2022)    Received  from [facility]    Exercise Vital Sign     On average, how many days per week do you engage in moderate to strenuous exercise (like a brisk walk)?: 2 days     On average, how many minutes do you engage in exercise at this level?: 40 min   Stress: No Stress Concern Present  (09/09/2022)    Received from [facility] - Occupational Stress Questionnaire     Feeling of Stress : Not at all   Social Connections: Moderately Isolated (09/09/2022)    Received from [facility]    Social Connection and Isolation Panel     In a typical week, how many times do you talk on the phone with family, friends, or neighbors?: More than three times a week     How often do you get together with friends or relatives?: Never     How often do you attend church or religious services?: Never     Do you belong to any clubs or organizations such as church groups, unions, fraternal or athletic groups, or school groups?: No     How often do you attend meetings of the clubs or organizations you belong to?: Never     Are you married, widowed, divorced, separated, never married, or living with a partner?: Married   Depression: Not At Risk (12/07/2021)    PHQ-2     PHQ-2 Score: 0   Housing Stability: At Risk (11/03/2024)    Received from [facility] [place]  [facility]    Housing Stability NCSS     Do you have housing?: No     Are you worried about losing your housing?: No   Interpersonal Violence: Not At Risk (11/03/2024)    Received from [facility] [place]  [facility]    Humiliation, Afraid, Rape, and Kick questionnaire     Within the last year, have you been afraid of your partner or ex-partner?: No     Within the last year, have you been humiliated or emotionally abused in other ways by your partner or ex-partner?: No     Within the last year, have you been kicked, hit, slapped, or otherwise physically hurt by your partner or ex-partner?: No     Within the last year, have you been raped or forced to have any kind of sexual activity by your partner or ex-partner?: No   Utilities: Not At Risk (11/03/2024)    Received from [facility]    [facility] Utilities     In the past 12 months has the electric, gas, oil, or water  company  threatened to shut off services in your home?: No      No family history on file.       Review of Systems - A comprehensive 10+ review of systems was negative except as noted above.     Physical Exam  BP (!) 80/56   Pulse 90   Temp 36.5 C (97.7 F) (Temporal)   Ht 1.727 m (5' 8)   Wt 69.8 kg (153 lb 14.4 oz)   BMI 23.40 kg/m       Physical Exam       Gen: Well developed, well nourished. Well appearing, no acute distress, awake, alert, oriented X 3, good hygiene, good mood, good historian, and good habitus.  Skin: The TMA incision right appears well healed, no signs of dehiscence, no swelling, no odor,  no pain  on palpation, no drainage, no erythema. Resolved incisional necrosis right foot.  There is no odor, resolved callus about the plantar cuboid right foot.  There there is a transfer ulceration stump fourth metatarsal right foot 1.0 cm x 0.6 cm 0.1 cm no probe to bone, granular, no slough, no odor, minimal swelling, minimal drainage, minimal callus wound edge.  Her cuboid and heel appear to be intact & quite stable.  Dorsal hair both feet: None  Skin color both feet: Decreased  Skin texture both feet: Decreased  Skin turgor both feet: Decreased  Callus either foot: None.   Nails:  Onychomycosis: negative  Length: Short no onychia either foot.  Appearance:  normal  Ulceration:Yes stump plantar 4th metatarsal right foot.  Other: UT Ulcer Class:    Musculoskeletal: alignment and gait unstable   Muscle strength and tone in transverse planes, right foot: Decreased   Muscle strength and tone in transverse planes, left foot: Decreased   Muscle strength and tone in sagittal planes, right foot:Decreased   Muscle strength and tone in sagittal planes, left foot: Decreased     JOINT MOBILITY:  Range of motion of ankle right (<0 degrees): Yes  Range of motion of ankle left (<0 degrees): Yes  Range of motion of subtalar joint right (< 20 degrees total motion):Yes   Range of motion of subtalar joint left  (< 20 degrees  total motion): Yes  Range of motion 1st MTPJ right( < DF 50 degrees): No  Range of motion 1st MTPJ left (< DF 50 degrees): Yes  Rigid toe contractures right foot: No   Rigid toe contractures left foot: Yes  Appearance of prior amputation sites, if applicable: Healing TMA right foot  Foot type:  Pes Rectus  Gait pattern, heel to toe both: Abnormal  Posture, arm swing and station: Abnormal  Soft tissue envelope has shrunken and there is prominence about the fourth metatarsal stump right foot.  There is some adductovarus component to her foot as well having lost her fifth ray from previous intervention.    Neuro: Awake & oriented x3.   Sharp/dull discrimination right foot: Decreased  Sharp/dull discrimination left foot: Decreased  Vibratory sensation both:  1st MTP: Decreased  Ankle: Decreased  Knee: Decreased  5.07 monofilament right foot: Decreased  5.07 monofilament left foot: Decreased  Proprioception right foot: Normal  Proprioception left foot: Normal  Motor coordination heel to patella to ankle right: Abnormal  Motor coordination heel to patella to ankle left: Abnormal  Achilles DTR right: Absent  Achilles DTR left: Absent  Patella DTR right: Absent  Patella DTR left: Absent  LOPS: Yes    Psych: Appropriate behavior, normal affect, appropriate mood    Diabetic Wound Assessment - see above      Labs obtained, reviewed and discussed with patient:  CBC With Differential  Order: [id number] - Part of Panel Order [id number]  Status: Final result       Visible to patient: Yes (not seen)       Next appt: None    0 Result Notes               Component  Ref Range & Units 2 wk ago  (02/16/24) 2 wk ago  (02/14/24) 2 wk ago  (02/13/24) 2 wk ago  (02/12/24) 2 wk ago  (02/11/24) 3 wk ago  (02/10/24) 3 wk ago  (02/08/24)    White Blood Cell Count  4.50 - 11.00 K/cu mm 6.03 9.86 <redacted file path> 7.91 <redacted  file path> 8.04 <redacted file path> 5.04 <redacted file path> 5.22 <redacted file path> 5.43 <redacted file path>    Red  Blood Cell Count  4.00 - 5.20 M/cu mm 2.72 Low  3.01 <redacted file path> Low <redacted file path>  3.04 <redacted file path> Low <redacted file path>  3.15 <redacted file path> Low <redacted file path>  3.47 <redacted file path> Low <redacted file path>  3.56 <redacted file path> Low <redacted file path>  3.09 <redacted file path> Low <redacted file path>     Hemoglobin  12.0 - 15.0 g/dL 8.2 Low  8.9 <redacted file path> Low <redacted file path>  9.1 <redacted file path> Low <redacted file path>  9.5 <redacted file path> Low <redacted file path>  10.0 <redacted file path> Low <redacted file path>  10.6 <redacted file path> Low <redacted file path>  9.3 <redacted file path> Low <redacted file path>     Hematocrit  36.0 - 46.0 % 25.7 Low  28.2 <redacted file path> Low <redacted file path>  28.5 <redacted file path> Low <redacted file path>  29.5 <redacted file path> Low <redacted file path>  31.4 <redacted file path> Low <redacted file path>  33.1 <redacted file path> Low <redacted file path>  28.5 <redacted file path> Low <redacted file path>     Mean Corpuscular Volume  80.0 - 100.0 fL 94.5 93.7 <redacted file path> 93.8 <redacted file path> 93.7 <redacted file path> 90.5 <redacted file path> 93.0 <redacted file path> 92.2 <redacted file path>    Mean Corpus Hgb  26.0 - 34.0 pg 30.1 29.6 <redacted file path> 29.9 <redacted file path> 30.2 <redacted file path> 28.8 <redacted file path> 29.8 <redacted file path> 30.1 <redacted file path>    Mean Corpus Hgb Conc  31.0 - 37.0 g/dL 68.0 68.3 <redacted file path> 31.9 <redacted file path> 32.2 <redacted file path> 31.8 <redacted file path> 32.0 <redacted file path> 32.6 <redacted file path>    RBC Distribution Width  11.5 - 14.5 % 12.9 12.8 <redacted file path> 12.8 <redacted file path> 12.9 <redacted file path> 12.7 <redacted file path> 12.7 <redacted file path> 13.0 <redacted file path>    Platelet Count  150 - 350 K/cu mm 286 340 <redacted file path> 368 <redacted  file path> High <redacted file path>  372 <redacted file path> High <redacted file path>  407 <redacted file path> High <redacted file path>  431 <redacted file path> High <redacted file path>  379 <redacted file path> High <redacted file path>     Mean Platelet Volume  9.2 - 12.7 fL 10.9 10.7 <redacted file path> 10.7 <redacted file path> 10.6 <redacted file path> 10.4 <redacted file path> 10.7 <redacted file path> 11.1 <redacted file path>    Nucleated RBC Number  0.00 - 0.01 K/cu mm 0.00 0.00 <redacted file path> 0.00 <redacted file path> 0.00 <redacted file path> 0.00 <redacted file path> 0.00 <redacted file path> 0.00 <redacted file path>    Neutrophil %  40.0 - 70.0 % 62.5 76.0 <redacted file path> High <redacted file path>  61.4 <redacted file path> 62.4 <redacted file path> 52.0 <redacted file path> 48.6 <redacted file path> 54.1 <redacted file path>    Lymphocytes %  24.0 - 44.0 % 23.2 Low  13.3 <redacted file path> Low <redacted file path>  24.3 <redacted file path> 27.6 <redacted file path> 34.3 <redacted file path> 37.9 <redacted file path> 31.5 <redacted file path>    Monocyte %  2.0 - 11.0 % 9.0 8.5 <redacted file path> 10.1 <redacted  file path> 7.1 <redacted file path> 8.7 <redacted file path> 8.0 <redacted file path> 8.5 <redacted file path>    Eosinophil %  1.0 - 4.0 % 4.3 High  1.2 <redacted file path> 3.2 <redacted file path> 2.0 <redacted file path> 3.8 <redacted file path> 4.2 <redacted file path> High <redacted file path>  4.6 <redacted file path> High <redacted file path>     Basophil %  0.0 - 2.0 % 0.8 0.5 <redacted file path> 0.9 <redacted file path> 0.7 <redacted file path> 1.0 <redacted file path> 1.1 <redacted file path> 1.3 <redacted file path>    Immature Gran %  0.0 - 1.0 % 0.2 0.5 <redacted file path> CM 0.1 <redacted file path> CM 0.2 <redacted file path> CM 0.2 <redacted file path> CM 0.2 <redacted file path> CM 0.0 <redacted file path> CM   Comment: Immature Grans = Promyelocytes +  Myleocytes + Metamyelocytes    ANC-Neutrophil Absolute  1.50 - 7.80 K/cu mm 3.77 7.49 <redacted file path> 4.86 <redacted file path> 5.01 <redacted file path> 2.62 <redacted file path> 2.53 <redacted file path> 2.94 <redacted file path>    Lymphocytes Absolute  1.10 - 4.80 K/cu mm 1.40 1.31 <redacted file path> 1.92 <redacted file path> 2.22 <redacted file path> 1.73 <redacted file path> 1.98 <redacted file path> 1.71 <redacted file path>    Monocyte Absolute  0.10 - 1.20 K/cu mm 0.54 0.84 <redacted file path> 0.80 <redacted file path> 0.57 <redacted file path> 0.44 <redacted file path> 0.42 <redacted file path> 0.46 <redacted file path>    Eosinophil Absolute  0.12 - 0.30 K/cu mm 0.26 0.12 <redacted file path> 0.25 <redacted file path> 0.16 <redacted file path> 0.19 <redacted file path> 0.22 <redacted file path> 0.25 <redacted file path>    Immature Granulocytes Abs  0.00 - 0.05 K/cu mm 0.01 0.05 <redacted file path> CM 0.01 <redacted file path> CM 0.02 <redacted file path> CM 0.01 <redacted file path> CM 0.01 <redacted file path> CM 0.00 <redacted file path> CM        Basic metabolic panel  Order: [id number]  Status: Final result       Visible to patient: Yes (not seen)       Next appt: None    0 Result Notes       1 HM Topic               Component  Ref Range & Units 2 wk ago  (02/16/24) 2 wk ago  (02/14/24) 2 wk ago  (02/13/24) 2 wk ago  (02/12/24) 2 wk ago  (02/12/24) 2 wk ago  (02/11/24) 3 wk ago  (02/10/24)    Sodium  135 - 148 mmol/L 135 133 <redacted file path> Low <redacted file path>  133 <redacted file path> Low <redacted file path>  134 <redacted file path> Low <redacted file path>  132 <redacted file path> Low <redacted file path>  134 <redacted file path> Low <redacted file path>  135 <redacted file path>    Potassium  3.5 - 5.1 mmol/L 4.1 4.5 <redacted file path> 5.0 <redacted file path> 4.5 <redacted file path> 5.4 <redacted file path> High <redacted file path>  4.6 <redacted file path> 4.7 <redacted file  path>    Chloride  96 - 109 mmol/L 98 96 <redacted file path> 98 <redacted file path> 97 <redacted file path> 96 <redacted file path> 99 <redacted file path> 97 <redacted file path>    Carbon Dioxide  21 - 31 mmol/L 28 25 <redacted file path> 26 <redacted file  path> 26 <redacted file path> 26 <redacted file path> 26 <redacted file path> 28 <redacted file path> CM    Urea Nitrogen  7 - 22 mg/dL 9 13 <redacted file path> 12 <redacted file path> 13 <redacted file path> 13 <redacted file path> 9 <redacted file path> 9 <redacted file path>    Glucose  71 - 99 mg/dL 787 High  776 <redacted file path> High <redacted file path>  248 <redacted file path> High <redacted file path>  275 <redacted file path> High <redacted file path>  338 <redacted file path> High <redacted file path>  191 <redacted file path> High <redacted file path>  109 <redacted file path> High <redacted file path>     Calcium  8.4 - 10.5 mg/dL 9.3 9.4 <redacted file path> 9.7 <redacted file path> 9.6 <redacted file path> 9.1 <redacted file path> 10.1 <redacted file path> 10.6 <redacted file path> High <redacted file path>     Anion Gap  7 - 16 mmol/L 9 12 <redacted file path> 9 <redacted file path> 11 <redacted file path> 10 <redacted file path> 9 <redacted file path> 10 <redacted file path>    BUN / Creatinine Ratio 8 11 <redacted file path> 10 <redacted file path> 9 <redacted file path> 10 <redacted file path> 6 <redacted file path> 8 <redacted file path>    eGFR -CKD-EPI Creatinine (2021)  >60 mL/min/1.73 sqm 59 Low  56 <redacted file path> Low <redacted file path>  53 <redacted file path> Low <redacted file path>  42 <redacted file path> Low <redacted file path>  45 <redacted file path> Low <redacted file path>  44 <redacted file path> Low <redacted file path>  61 <redacted file path>    Creatinine  0.50 - 1.20 mg/dL 8.89 8.84 <redacted file path> 1.20 <redacted file path> 1.44 <redacted file path> High <redacted file path>  1.36 <redacted file path>  High <redacted file path>  1.40 <redacted file path> High <redacted file path>  1.06 <redacted file path>    Total Protein     5.9 <redacted file path> Low <redacted file path>  R      Albumin     3.2 <redacted file path> Low <redacted file path>  R      Bilirubin,Total     0.2 <redacted file path> R      Alkaline Phosphatase     115 <redacted file path> R      Alanine Amino Trans     13 <redacted file path> R      AST/ALT Ratio     CM      Aspartate Amino Transferase     Hemolyzed <redacted file path> C          X-rays obtained, reviewed and discussed with patient:  Study Result    EXAM: XR FOOT RIGHT MIN 3 VWS  .    INDICATION: Evaluate osteomyelitis 4th metatarsal base. Right foot.  COMPARISON: 08/24/2024  TECHNIQUE: XR FOOT RIGHT MIN 3 VWS.    FINDINGS:  XR FOOT RIGHT MIN 3 VWS -  See impression.      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  Soft tissue ulceration at the plantar lateral aspect of the foot extending  near to the lateral plantar aspect of the remnant fourth metatarsal base,  with associated demineralization, cortical irregularity and fragmentation,  concerning for osteomyelitis.  Minimal lucency and irregularity at the  distal lateral aspect of the third  metatarsal base, not definitely changed from prior accounting for  technical differences; however given the proximity of the soft tissue  ulceration, involvement of the third metatarsal base is not excluded.    THIS REPORT CONTAINS FINDINGS THAT MAY BE CRITICAL TO PATIENT CARE.    Finding observed date/time: 12:30 PM on 12/01/2024    These findings: Impression  were communicated by secure chat with  confirmation of receipt  to [facility] DPM at 12:33 PM on 12/01/2024    FLAG: (U)        Images and interpretation personally reviewed by: Prentice Noel, MD       Vascular labs obtained, reviewed and discussed with patient:  Vascular  Findings    Prior Study There was a previous exam performed on 11/12/2021.   Right ABI: 1.15         Left ABI: 1.00  Right TBI: 0.76         Left TBI: 0.77   ABI/Multi The right ankle brachial index was not calculated due to suprasystemic pedal pressures. The left ankle brachial index demonstrates normal arterial perfusion to the left lower extremity at rest. The right toe brachial index is normal.   The left toe brachial index is normal.   ABI Questionable reliability of the right ABI based on waveform morphology, likely falsely elevated. Questionable reliability of left ABI due to presence of non-compressible posterior tibial vessel.    Additional ankle and metatarsal waveforms obtained.        Procedure Data     Right Left   Arm 154 mmHg       151 mmHg         Posterior Tibial 154 mmHg       >254 mmHg         Dorsalis Pedis 153 mmHg       188 mmHg         Toe 110 mmHg       140 mmHg         Resting ABI 1.00       1.22         Resting TBI 0.71       0.91            Dorsalis Pedis right foot: Present  Dorsalis Pedis left foot: Present  Posterior Tibial right foot: Absent  Posterior Tibial left foot: Absent  Capillary filling time right foot: Under 3 seconds  Capillary filling time left foot: Under 3 seconds  Edema right leg, ankle and foot: Negative  Edema left leg, ankle and foot: Negative  Temperature right foot: Warm  Temperature left foot: Warm  Palpation of lymph nodes was completed in the lower extremities and none were found.    Microbiology obtained, reviewed and discussed with patient:    Contains abnormal data Bacterial Culture, NOT Urine/Blood/Resp  Order: [id number]   Status: Final result       Next appt: 10/13/2024 at 10:00 AM in Vascular Surgery [facility] LITTIE Marcus, DPM)    Test Result Released: Yes (seen)    Specimen Information: Wound Surgical, Foot Right   0 Result Notes  Aerobic Misc Culture Very Light Mixed Skin Flora   Light growth Pseudomonas aeruginosa Abnormal    Very Light growth Pseudomonas  aeruginosa Abnormal    second morphology            Gram stain Very Light Polymorphonuclear leukocytes   No organisms seen  Resulting Agency: [facility]     Susceptibility     Pseudomonas aeruginosa (1) Pseudomonas aeruginosa (2)     SUSCEPTIBILITY/INTERP SUSCEPTIBILITY/INTERP     Aztreonam 8 ug/mL S 8 ug/mL S     Cefepime 4 ug/mL S 4 ug/mL S     Ceftazidime 4 ug/mL S 4 ug/mL S     Ciprofloxacin 0.5 ug/mL S 0.5 ug/mL S     Meropenem <=0.5 ug/mL S <=0.5 ug/mL S     Piperacillin-Tazobactam 4/4 ug/mL S 8/4 ug/mL S           Comments    Bone            Order Questions    Question Answer   Workup? Aerobic and Anaerobic workup only   Release result to MyChart (visible to patient and proxy): Immediate   Note: If nothing is selected, the default is Immediate   Site# 1          Bacterial Culture, NOT Urine/Blood/Resp  Order: [id number]  Status: Final result         Visible to patient: Yes (not seen)         Next appt: None      Specimen Information: Tissue, Bone   0 Result Notes      Aerobic/Anaerobic Misc Culture No growth            Gram stain           Comments    E-swab            Collection Questions    Question Answer Comment   MICRO PROCESSING QUESTIONS    Microbiology processing comments:       Order Questions    Question Answer   Workup? Aerobic and Anaerobic workup only   Release result to MyChart (visible to patient and proxy): Immediate   Note: If nothing is selected, the default is Immediate   Site# 1            Bacterial Culture, NOT Urine/Blood/Resp  Order: [id number]  Status: Final result       Visible to patient: Yes (not seen)       Next appt: None    Specimen Information: Abscess, Other   0 Result Notes  Aerobic/Anaerobic Misc Culture No growth            Gram stain Very Light Squamous epithelial cells   No organisms seen             Surgical Pathology obtained, reviewed and discussed with patient:  Surgical pathology exam: Routine: [id number]  Order: [id number]  Status: Final result       Visible to  patient: Yes (not seen)       Next appt: None    0 Result Notes      Component                Diagnosis      1. Foot, Right, Toes and Metatarsal (Transmetatarsal amputation): Toes and metatarsals with gangrenous necrosis.      2. Foot, Right, 4th Metarsal (Debridement and Amputation): Osteomyelitis.             Electronically signed by Sheldon Norleen Rush, MD on 02/28/2024 at 1530        The electronic signature attests that the above diagnosis is based upon the personal examination of the slides (and/or other material indicated in the diagnosis) by the responsible pathologist, and that the responsible pathologist has reviewed and  approved this report.   Gross Description    1. Foot, Right:     Specimen received: Fresh  Patient's name on label: Rollene Hun Deangelo   Specimen designation: Right toes and metatarsal     The specimen consists of a right forefoot amputation with attached digits 1-4 (7.0 x 5.5 x 2.5 cm) with 4 additional fragments of metatarsals, measuring 6.5 x 4.1 x 2.0 cm in aggregate.  The skin is pink-tan, wrinkled, and slightly indurated and erythematous at the junction of the third and fourth digit.  A screw is noted within the fourth digit.  Gross photographs are taken.  Representative sections are submitted following decalcification.     Summary of Sections  ------------------------------------  As of: 02/13/24 2:31 PM  ------------------------------------  1A - Block - 2 - Rep digit 4, decal  Total blocks - 1      Entered by Lorane Gobble, PA(ASCP), 02/13/24 2:00 PM.        2. Foot, Right:     Specimen received: Fresh  Patient's name on label: Rollene Hun Devaux   Specimen designation: Right 4th metatarsal     The specimen consists of a single piece of yellow-tan, focally cauterized bone measuring 2.5 x 1.5 x 1.5 cm.  A representative section is submitted following decalcification.     Summary of Sections  ------------------------------------  As of: 02/13/24 2:43  PM  ------------------------------------  2A - Block - 1 - Rep section, decal  Total blocks - 1      Entered by Lorane Gobble, PA(ASCP), 02/13/24 2:41 PM.          Assessment and Plan:  Diagnosis:  Diabetic Foot Ulcer: Yes plantar 4th metatarsal stump right.  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    Other osteomyelitis of right foot M86.8X7    Acute osteomyelitis of right ankle or foot M86.171    Osteomyelitis due to Staphylococcus aureus M86.9, A49.01    Diabetic foot infection E11.628, L08.9    Nausea and vomiting, unspecified vomiting type R11.2    Hyperglycemia R73.9    MSSA (methicillin susceptible Staphylococcus aureus) infection A49.01    Insulin  pump in place Z96.41    Stage I pressure ulcer of right heel L89.611    Diabetic ulcer of right midfoot associated with type 1 diabetes mellitus, with fat layer exposed E10.621, L97.412    Abscess of right foot L02.611    Cellulitis of right lower extremity L03.115    Leukemoid reaction D72.823    Anemia, unspecified type D64.9    Elevated alkaline phosphatase level R74.8    History of coronary artery disease Z86.79    History of hypothyroidism Z86.39    Diabetic ulcer of right midfoot associated with type 2 diabetes mellitus, limited to breakdown of skin E11.621, L97.411    Osteomyelitis M86.9    Diabetic infection of right foot E11.628, L08.9    Type 1 diabetes mellitus with hyperglycemia, with long-term current use of insulin  E10.65    Streptococcus infection, group C A49.1    Chronic back pain M54.9, G89.29    Hyperlipidemia E78.5  Post-operative pain G89.18        Treatment:  Diabetes Education Provided: Yes  Surgical Debridement:   Mycotic nails: No  Hyperkeratosis (callus): No  Ulceration: Yes Debridement , subcutaneous tissue (includes epidermis and dermis) first 20 square cm or less: after wound preparation with Vashe solution, sharp tissue removal of the wound was performed ( i.e skin, full thickness) with a sterile gauze to bleeding. This debridement was based upon the wound characteristics noted in the above Wound Flowsheet. The debridement resulted in the removal of devitalized, fibrotic, yellow tissue until viable bleeding tissue occurred in hopes of improving the wound healing process.  Location:  plantar right foot.     I&D of abscess: No  Wound Dressings:   Primary:  Aquacel (Ca/Ag) : No  Collagen:(Biopad): Yes then in about 1-2 week. Then every other day   Debriders: Santyl:No  Topical Abx Ointment: No  Hydrogel:(Silvasorb): No  Filler(Iodosorb): No  Contact Layer:   Adaptic:No  Mepilex/Transfer/Tritec Ag:No  DSD: Yes  NPWT (VAC): No                   ECM Biologics:   Heliogen: No   Amniotics (Grafix, Prime): Yes Cellular/Tissue-based product   The plantar right foot wound has been surgically prepared prior to the application of CORE with all handling consistent with the manufactures and FDA guideline. The approximate amount use is all with  none discarded. Every attempt is made to avoid wastage of product, it is our practice to only discard product that exceeds the surface area of the wound by greater than 0.5cm wound diameter. Medical necessity has been outline is the diagnosis linked to this order, failure to heal with conventional standards of care noted in the historic wound measurements and by the prior physician notes. Adjunctive therapies are in place for this procedure consisting of off loading with the custom AFO. Serial/lot/batch numbers: LOT [id number], [id number]  Hyalomatrix: No  Other:   Mechanical off-loading:   Total Contact Cast: No  Removable  Darco Off loader: No  Wedge Shoe: No  Darco softie: No   AFO: Yes         Darco cast shoe.   OR intervention: No  Assessment & Plan    Follow up evaluation and management completed, this is a complicated recurrent case.   The dorsal wound remains completely healed.  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   I have also made it clear to the patient that she that she may need some tendon rebalancing or even fusion to provide a more rectus foot and she has declined any type of evaluation or care along those lines.  Follow-up in ~ 2 weeks on a Wednesday AM to monitor wound healing, its overall progress and consider another application.  She has an uncertain prognosis.  I have reviewed the x-ray and I also concur that there are some changes about the distal fourth metatarsal and I am unsure if it is just pressure changes or osteomyelitis.  Her foot has never been red swollen or draining so we will continue to monitor this accordingly and we will get another x-ray in about 3 to 4 weeks.    The patient was provided with a clinical summary and treatment plan constructed for the condition and which was well understood when questioned.  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  She was very pleasant.

## 2025-01-03 ENCOUNTER — Ambulatory Visit
Admission: RE | Admit: 2025-01-03 | Discharge: 2025-01-03 | Disposition: A | Discharge status: Home / Self Care | Source: Ambulatory Visit | Attending: Student in an Organized Health Care Education/Training Program | Admitting: Student in an Organized Health Care Education/Training Program

## 2025-01-03 DIAGNOSIS — I6523 Occlusion and stenosis of bilateral carotid arteries: Secondary | ICD-10-CM | POA: Insufficient documentation

## 2025-01-04 ENCOUNTER — Telehealth (INDEPENDENT_AMBULATORY_CARE_PROVIDER_SITE_OTHER): Payer: Self-pay

## 2025-01-04 DIAGNOSIS — I6523 Occlusion and stenosis of bilateral carotid arteries: Secondary | ICD-10-CM

## 2025-01-04 DIAGNOSIS — Z9889 Other specified postprocedural states: Secondary | ICD-10-CM

## 2025-01-04 NOTE — Telephone Encounter (Addendum)
 Attempted to call with carotid duplex results. MyChart message sent. Repeat study ordered.     ----- Message from Gattis JINNY Dills, MD sent at 01/03/2025  6:44 PM EST -----  Regarding: Carotid  Hi [name] [name]    Please let the patient know    1. Patent right sided carotid endarterectomy site. Minimal plaque visible in the carotid bulb that represents <50% stenosis by Doppler criteria.  2. Left carotid system demonstrates mild to moderate plaque that is <50% stenosis of the internal carotid artery by Doppler criteria.  3. Normal antegrade flow in both vertebral arteries.  4. Stable from 01/14/2024.    Continue aspirin and statin    Plan to repeat in 12 months for interval progression.  Please order study, thanks.
